# Patient Record
Sex: Female | Born: 1945 | Race: White | Hispanic: No | Marital: Married | Smoking: Never smoker
Health system: Southern US, Community
[De-identification: ages and names within clinical notes are randomized; demographics above are authoritative.]

## PROBLEM LIST (undated history)

## (undated) DIAGNOSIS — K219 Gastro-esophageal reflux disease without esophagitis: Secondary | ICD-10-CM

## (undated) DIAGNOSIS — I10 Essential (primary) hypertension: Secondary | ICD-10-CM

## (undated) DIAGNOSIS — K759 Inflammatory liver disease, unspecified: Secondary | ICD-10-CM

## (undated) DIAGNOSIS — G709 Myoneural disorder, unspecified: Secondary | ICD-10-CM

## (undated) DIAGNOSIS — J383 Other diseases of vocal cords: Secondary | ICD-10-CM

## (undated) DIAGNOSIS — R011 Cardiac murmur, unspecified: Secondary | ICD-10-CM

## (undated) DIAGNOSIS — E785 Hyperlipidemia, unspecified: Secondary | ICD-10-CM

## (undated) HISTORY — PX: TONSILLECTOMY: SUR1361

## (undated) HISTORY — PX: COLONOSCOPY: SHX174

## (undated) HISTORY — DX: Hyperlipidemia, unspecified: E78.5

## (undated) HISTORY — PX: FRACTURE SURGERY: SHX138

## (undated) HISTORY — PX: CATARACT EXTRACTION: SUR2

---

## 2007-07-14 ENCOUNTER — Emergency Department (HOSPITAL_COMMUNITY): Admission: EM | Admit: 2007-07-14 | Discharge: 2007-07-14 | Payer: Self-pay | Admitting: Emergency Medicine

## 2007-07-16 ENCOUNTER — Encounter: Payer: Self-pay | Admitting: Family Medicine

## 2007-07-16 ENCOUNTER — Ambulatory Visit: Payer: Self-pay

## 2007-09-17 ENCOUNTER — Ambulatory Visit (HOSPITAL_COMMUNITY): Admission: RE | Admit: 2007-09-17 | Discharge: 2007-09-17 | Payer: Self-pay | Admitting: Family Medicine

## 2007-10-15 ENCOUNTER — Ambulatory Visit: Payer: Self-pay

## 2007-10-28 ENCOUNTER — Ambulatory Visit: Payer: Self-pay | Admitting: Internal Medicine

## 2007-12-30 ENCOUNTER — Ambulatory Visit: Payer: Self-pay | Admitting: Internal Medicine

## 2008-01-15 ENCOUNTER — Ambulatory Visit: Payer: Self-pay | Admitting: Emergency Medicine

## 2008-01-15 DIAGNOSIS — Z8679 Personal history of other diseases of the circulatory system: Secondary | ICD-10-CM | POA: Insufficient documentation

## 2008-01-15 DIAGNOSIS — I10 Essential (primary) hypertension: Secondary | ICD-10-CM | POA: Insufficient documentation

## 2008-01-15 DIAGNOSIS — J383 Other diseases of vocal cords: Secondary | ICD-10-CM | POA: Insufficient documentation

## 2008-01-15 DIAGNOSIS — R0602 Shortness of breath: Secondary | ICD-10-CM | POA: Insufficient documentation

## 2008-01-26 ENCOUNTER — Encounter: Payer: Self-pay | Admitting: Emergency Medicine

## 2008-02-10 ENCOUNTER — Encounter: Admission: RE | Admit: 2008-02-10 | Discharge: 2008-04-01 | Payer: Self-pay | Admitting: Emergency Medicine

## 2008-02-10 ENCOUNTER — Encounter: Payer: Self-pay | Admitting: Emergency Medicine

## 2008-02-17 ENCOUNTER — Ambulatory Visit: Payer: Self-pay | Admitting: Emergency Medicine

## 2008-02-23 ENCOUNTER — Ambulatory Visit: Payer: Self-pay | Admitting: Emergency Medicine

## 2008-03-16 ENCOUNTER — Ambulatory Visit: Payer: Self-pay | Admitting: Internal Medicine

## 2008-03-24 ENCOUNTER — Encounter: Payer: Self-pay | Admitting: Emergency Medicine

## 2008-04-13 ENCOUNTER — Ambulatory Visit: Payer: Self-pay | Admitting: Emergency Medicine

## 2008-04-14 ENCOUNTER — Encounter: Payer: Self-pay | Admitting: Emergency Medicine

## 2008-07-27 ENCOUNTER — Ambulatory Visit: Payer: Self-pay | Admitting: Internal Medicine

## 2009-11-25 ENCOUNTER — Encounter: Admission: RE | Admit: 2009-11-25 | Discharge: 2009-11-25 | Payer: Self-pay | Admitting: Family Medicine

## 2010-08-15 NOTE — Assessment & Plan Note (Signed)
[facility]ALTHCARE                            CARDIOLOGY OFFICE NOTE   Erin Mckee, Erin Mckee                       MRN:          [id number]  DATE:10/28/2007                            DOB:          [birth year]    CONSULTATION NOTE   REFERRING PHYSICIAN:  Shade Flood, M.D.   REASON FOR VISIT:  Dyspnea on exertion.   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 Neva Seat for further  evaluation of dyspnea.   History is really only significant for hypertension.  She was diagnosed  with borderline hypertension for 20 years but really did not need to be  treated until about the last 3 months.  She has no known history of  coronary disease.  She has never had a cardiac catheterization.   She states that in February, she went on a ski trip with her family to  [place].  When she was there she noticed that she felt very short of  breath and out of shape, so when she came home she vowed to get in  better shape, she started walking her dog every day and lost 15 pounds.  Unfortunately, she still has periods of severe dyspnea.  She says it  sort of comes and goes and when it comes she feels like her throat is  closing up and she just cannot get a breath.  She had fairly extensive  workup by her primary care physician.  She had a exercise Myoview, she  walked 7 minutes and 30 seconds.  EKG was normal, EF was 74% with no  evidence of ischemia.  She also had an echocardiogram, which showed  significant diastolic dysfunction, EF was vigorous around 70%.  Doppler  parameters were consistent with high left ventricular filling pressures.  Her pulmonary pressures were mildly elevated at 34.  She has also been  seen by ENT and found to have a sort of polypoid structure on her right  tonsil and this is going to be resected next Monday.  She also had  pulmonary function tests, which were reportedly  normal.  She has been  tried on inhalers and not had any benefit from this.  Finally, her  mother has pheochromocytoma.  She has had her urine catecholamines  checked, these were negative, although her serum catecholamines were  elevated.  She had a CT which showed no evidence of adrenal tumor.   She has also had a GI workup with negative barium swallow.  There is no  evidence of reflux on her exam with ENT.   REVIEW OF SYSTEMS:  She has not had any orthopnea, no PND, no lower  extremity edema.  She has not had any rashes or arthropathies.  She  denies any history of allergies.  Remainder of review of systems is  negative except for HPI and problem list.   PROBLEM LIST:  1. Hypertension.  2. Dyspnea as described above.   CURRENT MEDICATIONS:  1. Hydrochlorothiazide 12.5 a day.  2. Avapro 150 a day.  3.  Norvasc 10 a day.  4. Zyrtec 10 a day.  5. Prilosec 20 a day.   SOCIAL HISTORY:  She is married with 2 kids, works as a Teacher, early years/pre at  [employer].  She is not a smoker.  She does not drink.   FAMILY HISTORY:  Mother is alive at 3 with a history of  pheochromocytoma.  Father is alive at 98.  Two sisters are 36 and 29,  alive and well and brother who is 73.  There is no family history of  premature coronary artery disease.   PHYSICAL EXAMINATION:  GENERAL:  She is well appearing, ambulates around  the clinic without respiratory difficulty.  VITAL SIGNS:  Blood pressure is 132/80, heart rate is 91, and weight is  130.  HEENT:  Normal except for what appears to be a polyp like structure on  the right tonsil.  It does not appear obstructive.  NECK:  Supple.  There is no JVD.  Carotids are 2+ bilaterally with  bruits.  There is no lymphadenopathy or thyromegaly.  CARDIAC:  Regular rate and rhythm with a 2/6 systolic ejection murmur at  the left sternal border, which gets louder with inspiration.  LUNGS:  Clear.  There is no wheezing.  ABDOMEN:  Soft, nontender, nondistended.   There is no  hepatosplenomegaly.  No bruits.  No masses.  Good bowel sounds.  EXTREMITIES:  Warm with no cyanosis, clubbing, or edema.  No rash.  NEURO:  Alert and oriented x3.  Cranial nerves II through XII are  intact.  Moves all 4 extremities without difficulty.  Affect is  pleasant.   LABORATORY DATA:  EKG shows normal sinus rhythm at a rate of 91 with no  ST-T wave abnormalities.   ASSESSMENT:  Dyspnea; this is a difficult case.  So far, her workup is  really only notable for significant diastolic dysfunction.  There is no  evidence of ischemia or significant pulmonary hypertension.  My thought  is that her symptoms are likely due to diastolic dysfunction, but I  think other possibilities include a possible exercise-induced pulmonary  hypertension or allergies.  Certainly, she could have a possibility of  underlying ischemic heart disease, but her stress test is quite  reassuring.  She is scheduled for a partial tonsillectomy next week and  we are hoping this will help, but I am concerned that she may not see  any significant benefit.  We will wait, see her back in 4 to 6 weeks.  In the meantime, I will check a BNP, and an ANA and a sed a rate.  Should she continue to be short of breath after her tonsil is removed, I  think the next step would  be to proceed with a CPX test and possible allergy testing to see if we  can help further define the problem.  I told her to get in touch with me  should her symptoms be getting worse.     Bevelyn Buckles. Bensimhon, MD  Electronically Signed    DRB/MedQ  DD: 10/28/2007  DT: 10/29/2007  Job #: [id number]

## 2010-08-15 NOTE — Assessment & Plan Note (Signed)
[facility]                            CARDIOLOGY OFFICE NOTE   Erin, Mckee                       MRN:          [id number]  DATE:12/30/2007                            DOB:          1946/02/17    PRIMARY CARE PHYSICIAN:  Asencion Partridge. 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   PAST MEDICAL HISTORY:  Notable for hypertension.  However, the past  year, she has had difficulty with significant dyspnea.  She really  states that this feels like it is physically hard to inhale.  She feels  like she is congested in her upper airways.  She went to Dr. Suszanne Conners at ENT  and was found to have a polyp on her tonsils.  She underwent  tonsillectomy several weeks ago, which was a fairly difficult procedure  for her, but she feels much better, and fortunately has not had any  effect on her dyspnea, whatsoever.  On followup with Dr. Suszanne Conners, she was  told that she had paradoxical vocal cord motion and feels that this  maybe related to anxiety.  She was started on Ativan and also sent for a  biofeedback.   From a cardiac standpoint, she has had a Myoview, which was normal and  also an echocardiogram, which showed vigorous LV function with evidence  of significant diastolic dysfunction with elevated filling pressure.  She was since then started on a diuretic, but this has not helped  either.  She has started exercising and walks her dogs 2 miles a day and  also goes to the [employer].  She does a stationary bike and  Manufacturing engineer.  She states she feels like she is breathing okay  when she is on there, but really more short of breath at rest.  She has  not had any orthopnea or PND.  No lower extremity edema.  No wheezing.  She has tried inhalers in the past without any benefit.  She also had a  GI workup with a negative barium swallow,  and there was no evidence of  reflux on her exam with ENT.   CURRENT MEDICATIONS:  1. Avapro 150 a day.  2. Norvasc 10 a day.  3. Hydrochlorothiazide 25 a day.  4. Ativan 0.5 t.i.d.   PHYSICAL EXAMINATION:  GENERAL:  She is well appearing, in no acute  distress, ambulatory around the clinic without any respiratory  difficulty.  VITAL SIGNS:  Blood pressure is 130/84, heart rate is 88, and weight is  127.  HEENT:  Normal.  Oropharynx is clear.  NECK:  Supple.  There is no JVD.  Carotids are 2+ bilaterally without  bruits.  There is no lymphadenopathy or thyromegaly.  CARDIAC:  PMI is nondisplaced.  Regularly regular rate and rhythm with a  prominent S4.  No murmur.  LUNGS:  Clear.  No wheezes or stridor.  ABDOMEN:  Soft, nontender, and nondistended.  There is no  hepatosplenomegaly.  No bruits.  No masses.  EXTREMITIES:  Warm with no cyanosis, clubbing, or edema.  No rash.  NEURO:  Alert and oriented x3.  Cranial nerves II-XII are intact.  Moves  all 4 extremities without difficulty.  Affect is pleasant.   ASSESSMENT AND PLAN:  Dyspnea.  This really seems like more of a  mechanical problem for her.  We had a long discussion about the nature  of diastolic dysfunction, and although she does appear to have  significant diastolic dysfunction on her echocardiogram, I do not think  this explains her sense of difficulty inhaling.  We will set up an  appointment with Dr. Delton Coombes to see if he has any insights.  If this is  unrevealing, we did discuss the possibility of right and left heart  catheterization to more clearly sort out her filling pressures and  coronary anatomy.  She is not very excited about this prospect at this  time.  We will see how she does over the next few months and see her  back and continue these discussions.   Given her hyperdynamic left ventricular function, I did start her on a  low-dose Toprol 50 mg a day and see if this has any effect on her  symptoms.      Bevelyn Buckles. Bensimhon, MD  Electronically Signed    DRB/MedQ  DD: 12/30/2007  DT: 12/31/2007  Job #: [id number]   cc:   Shade Flood, M.D.

## 2010-08-15 NOTE — Assessment & Plan Note (Signed)
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  She also has a history of  hypertension.   She has had a fairly extensive workup.  Already she has seen ENT.  She  has had normal Myoview and an echocardiogram which showed vigorous LV  dysfunction with diastolic dysfunction.  She has also seen Dr. Delton Coombes,  who sent her for speech training to help with her vocal cord problem.  Looking back over the notes apparently she saw Dr. Suszanne Conners from ENT and was  found to have paradoxical vocal cord motion, feels like this maybe  related to anxiety.  She was treated with Ativan and biofeedback.   She returns today for followup.  She is now exercising at the gym.  She  said initially she could not do a minute and a half on exercise machine  and now she can do 30 minutes at a time.  She starts out to get a little  short of breath and she feels fine; sometimes on the machine, it shows  that her heart rate goes up slowly, then goes from 110 to 220.  However,  she has a heart rate watch and she has not noticed this jump.  She  denies any chest pain.  She does have some chronic dyspnea, which can  come on anytime.  She is actually taking more Ativan which seems to help  her and during that time, she feels she cannot breathe.   She has not had any orthopnea or PND.  No lower extremity edema.  Blood  pressure dropped significantly with Toprol, so she ended up stopping her  Avapro.   CURRENT MEDICATIONS:  1. Norvasc 10 a day.  2. Hydrochlorothiazide 25 a day.  3. Ativan 0.5 mg t.i.d.  4. Toprol-XL 50 a day.   PHYSICAL EXAMINATION:  GENERAL:  She is in  no acute distress.  She  ambulates in the clinic without any respiratory difficulty.  VITAL SIGNS:  Blood pressure is 122/70 that 104/78, heart rate 72, and  weight is 134.  HEENT:  Normal.  NECK:  Supple.  There is no JVD.  Carotids are 2+ bilaterally without  any bruits.  There is no lymphadenopathy or thyromegaly.  CARDIAC:  PMI is nondisplaced.  Regular rate and rhythm.  No murmurs,  rubs, or gallops.  LUNGS:  Clear.  ABDOMEN:  Soft, nontender, nondistended.  No hepatosplenomegaly, no  bruits, no masses.  Good bowel sounds.  EXTREMITIES:  Warm with no cyanosis, clubbing, or edema.  NEUROLOGIC:  Alert and oriented x3.  Cranial nerves II through XII  intact.  Moves all 4 extremities without difficulty.  Affect is  pleasant.  She does seem to have a little bit  abnormal speech pattern  where she gasps for air.   EKG shows normal sinus rhythm at a rate of 72.   ASSESSMENT AND PLAN:  Dyspnea.  I suspect this is multifactorial.  She  does have evidence of diastolic dysfunction on her echocardiogram,  however, her symptoms are not really that consistent with this and I do  suspect her vocal cord dysfunction maybe playing a role as well as  possible anxiety disorder.  At this point, we will refer her for  cardiopulmonary exercise test to further evaluate.  We will also send  her to a clinic she knows about at [facility] which deals with vocal  cord abnormalities and see what therapies they may have to offer.     Bevelyn Buckles. Bensimhon, MD  Electronically Signed    DRB/MedQ  DD: 03/16/2008  DT: 03/17/2008  Job #: [id number]

## 2010-12-26 LAB — POCT I-STAT, CHEM 8
BUN: 8
Calcium, Ion: 1.17
Chloride: 97
Creatinine, Ser: 1.1
Glucose, Bld: 118 — ABNORMAL HIGH
HCT: 41
Hemoglobin: 13.9
Potassium: 3.8
Sodium: 136
TCO2: 31

## 2011-09-13 ENCOUNTER — Ambulatory Visit (INDEPENDENT_AMBULATORY_CARE_PROVIDER_SITE_OTHER): Payer: Medicare Other | Admitting: Family Medicine

## 2011-09-13 VITALS — BP 154/84 | HR 72 | Temp 98.6°F | Resp 16 | Ht 65.0 in | Wt 156.8 lb

## 2011-09-13 DIAGNOSIS — J383 Other diseases of vocal cords: Secondary | ICD-10-CM | POA: Diagnosis not present

## 2011-09-13 DIAGNOSIS — E785 Hyperlipidemia, unspecified: Secondary | ICD-10-CM | POA: Insufficient documentation

## 2011-09-13 DIAGNOSIS — K767 Hepatorenal syndrome: Secondary | ICD-10-CM | POA: Diagnosis not present

## 2011-09-13 DIAGNOSIS — I10 Essential (primary) hypertension: Secondary | ICD-10-CM | POA: Diagnosis not present

## 2011-09-13 LAB — BASIC METABOLIC PANEL
BUN: 14 mg/dL (ref 6–23)
CO2: 30 mEq/L (ref 19–32)
Calcium: 10 mg/dL (ref 8.4–10.5)
Chloride: 96 mEq/L (ref 96–112)
Creat: 0.78 mg/dL (ref 0.50–1.10)
Glucose, Bld: 97 mg/dL (ref 70–99)
Sodium: 134 mEq/L — ABNORMAL LOW (ref 135–145)

## 2011-09-13 MED ORDER — AMITRIPTYLINE HCL 25 MG PO TABS: 25.0000 mg | ORAL_TABLET | Freq: Every day | ORAL | Status: DC

## 2011-09-13 MED ORDER — LORAZEPAM 0.5 MG PO TABS: ORAL_TABLET | ORAL | Status: DC

## 2011-09-13 MED ORDER — HYDROCHLOROTHIAZIDE 25 MG PO TABS: 25.0000 mg | ORAL_TABLET | Freq: Every day | ORAL | Status: DC

## 2011-09-13 MED ORDER — METOPROLOL SUCCINATE ER 50 MG PO TB24: 50.0000 mg | ORAL_TABLET | Freq: Every day | ORAL | Status: DC

## 2011-09-13 NOTE — Patient Instructions (Addendum)
Your should receive a call or letter about your lab results within the next week to 10 days.  Return to the clinic or go to the nearest emergency room if any of your symptoms worsen or new symptoms occur. Schedule physical in the next 3 to 6 months.

## 2011-09-13 NOTE — Progress Notes (Signed)
  Subjective:    Patient ID: Erin Mckee, female    DOB: 09-17-1945, 66 y.o.   MRN: [id number] HPI Erin Mckee is a 66 y.o. female  HTn - outside BP's - 130/80's.  Rare higher number.    Hyperlipidemia.- on fish oil. Tchol 217, LDL 122. Weight 148-156 since July 2012.  Advil for sore muscles - 600mg   3-4 times per day.     Review of Systems  Constitutional: Negative for fatigue.  Respiratory: Negative for chest tightness and shortness of breath.   Cardiovascular: Negative for chest pain, palpitations and leg swelling.  Gastrointestinal: Negative for abdominal pain and blood in stool.  Neurological: Negative for dizziness, syncope, light-headedness and headaches.       Objective:   Physical Exam  Constitutional: She is oriented to person, place, and time. She appears well-developed and well-nourished.  HENT:  Head: Normocephalic and atraumatic.  Eyes: Conjunctivae and EOM are normal. Pupils are equal, round, and reactive to light.  Neck: Carotid bruit is not present.  Cardiovascular: Normal rate, regular rhythm, normal heart sounds and intact distal pulses.   Pulmonary/Chest: Effort normal and breath sounds normal.  Abdominal: Soft. She exhibits no pulsatile midline mass. There is no tenderness.  Neurological: She is alert and oriented to person, place, and time.  Skin: Skin is warm and dry.  Psychiatric: She has a normal mood and affect. Her behavior is normal.          Assessment & Plan:  Erin Mckee is a 66 y.o. female 1. Hypertension  Basic metabolic panel  2. Hyperlipidemia  Basic metabolic panel  3. Vocal cord dysfunction      VCD - stable with Elavil, Ativan as prior.   Borderline control on HTN  Improved home numbers. No change in doses today.   Not fasting today. Slight weight gain, prior hyperlipidemia - on fish oil.  Plan on CPE next 3-6 months for fasting labs.  Work on diet and exercise until then.  Consider tsh if still with trouble losing  weight.

## 2011-09-19 DIAGNOSIS — L820 Inflamed seborrheic keratosis: Secondary | ICD-10-CM | POA: Diagnosis not present

## 2011-09-19 DIAGNOSIS — L259 Unspecified contact dermatitis, unspecified cause: Secondary | ICD-10-CM | POA: Diagnosis not present

## 2012-02-11 ENCOUNTER — Encounter (HOSPITAL_COMMUNITY): Payer: Self-pay | Admitting: Emergency Medicine

## 2012-02-11 ENCOUNTER — Emergency Department (INDEPENDENT_AMBULATORY_CARE_PROVIDER_SITE_OTHER): Payer: Medicare Other

## 2012-02-11 ENCOUNTER — Emergency Department (INDEPENDENT_AMBULATORY_CARE_PROVIDER_SITE_OTHER)
Admission: EM | Admit: 2012-02-11 | Discharge: 2012-02-11 | Disposition: A | Discharge status: Home / Self Care | Payer: Medicare Other | Source: Home / Self Care

## 2012-02-11 DIAGNOSIS — S52509A Unspecified fracture of the lower end of unspecified radius, initial encounter for closed fracture: Secondary | ICD-10-CM

## 2012-02-11 DIAGNOSIS — S52609A Unspecified fracture of lower end of unspecified ulna, initial encounter for closed fracture: Secondary | ICD-10-CM | POA: Diagnosis not present

## 2012-02-11 NOTE — Progress Notes (Signed)
Orthopedic Tech Progress Note Patient Details:  Erin Mckee [birth year] [id number]  Ortho Devices Type of Ortho Device: Arm foam sling;Sugartong splint Ortho Device/Splint Location: (R) UE Ortho Device/Splint Interventions: Application   [name] 02/11/2012, 8:28 PM

## 2012-02-11 NOTE — ED Provider Notes (Signed)
Medical screening examination/treatment/procedure(s) were performed by resident physician or non-physician practitioner and as supervising physician I was immediately available for consultation/collaboration.   Barkley Bruns MD.    Linna Hoff, MD 02/11/12 2038

## 2012-02-11 NOTE — ED Provider Notes (Signed)
History     CSN: [id number]  Arrival date & time 02/11/12  1617   None     Chief Complaint  Patient presents with  . Arm Injury    (Consider location/radiation/quality/duration/timing/severity/associated sxs/prior treatment) HPI Comments: Pleasant 66 year old female was attempting to mount a horse this afternoon however the saddle slipped and she fell with an outstretched right arm and injured her right wrist. Since her only injury she denies injury to the head neck chest back abdomen pelvis or other extremities. She is fully alert awake oriented with the only complaint of right wrist pain.  Patient is a 66 y.o. female presenting with arm injury.  Arm Injury     History reviewed. No pertinent past medical history.  History reviewed. No pertinent past surgical history.  History reviewed. No pertinent family history.  History  Substance Use Topics  . Smoking status: Never Smoker   . Smokeless tobacco: Not on file  . Alcohol Use: Not on file    OB History    Grav Para Term Preterm Abortions TAB SAB Ect Mult Living                  Review of Systems  All other systems reviewed and are negative.    Allergies  Review of patient's allergies indicates no known allergies.  Home Medications   Current Outpatient Rx  Name  Route  Sig  Dispense  Refill  . AMITRIPTYLINE HCL 25 MG PO TABS   Oral   Take 1 tablet (25 mg total) by mouth at bedtime.   90 tablet   1   . OMEGA-3 FATTY ACIDS 1000 MG PO CAPS   Oral   Take 2 g by mouth daily.         Marland Kitchen HYDROCHLOROTHIAZIDE 25 MG PO TABS   Oral   Take 1 tablet (25 mg total) by mouth daily.   90 tablet   1   . IBUPROFEN 100 MG PO TABS   Oral   Take 100 mg by mouth every 6 (six) hours as needed.         Marland Kitchen LORAZEPAM 0.5 MG PO TABS      Once per day as needed.   90 tablet   1   . METOPROLOL SUCCINATE ER 50 MG PO TB24   Oral   Take 1 tablet (50 mg total) by mouth daily. Take with or immediately following a  meal.   90 tablet   1     BP 140/70  Pulse 81  Temp 98.4 F (36.9 C) (Oral)  Resp 16  SpO2 100%  Physical Exam  Constitutional: She is oriented to person, place, and time. She appears well-developed and well-nourished. No distress.  HENT:  Head: Normocephalic and atraumatic.  Eyes: EOM are normal. Pupils are equal, round, and reactive to light.  Neck: Normal range of motion. Neck supple.  Musculoskeletal: She exhibits edema and tenderness.       There is swelling and deformity of the right wrist. Distal neurovascular motor sensory is intact she is able to wiggle her fingers and flex and extend the digits. Radial pulse is 2+.  Lymphadenopathy:    She has no cervical adenopathy.  Neurological: She is alert and oriented to person, place, and time. No cranial nerve deficit.  Skin: Skin is warm and dry.  Psychiatric: She has a normal mood and affect.    ED Course  Procedures (including critical care time)  Labs Reviewed - No data to  display Dg Wrist Complete Right  02/11/2012  *RADIOLOGY REPORT*  Clinical Data: Trauma and pain.  RIGHT WRIST - COMPLETE 3+ VIEW  Comparison: None.  Findings: Mildly displaced ulnar styloid fracture.  Comminuted, impacted, dorsally angulated, intra-articular distal radius fracture.  Overlying soft tissue swelling.  IMPRESSION: Distal radius and ulnar fractures, as detailed.   Original Report Authenticated By: Jeronimo Greaves, M.D.      1. Fracture of radius and ulna, distal       MDM  Dr. Otelia Sergeant was called and consulted regarding the fracture the radius and ulna at 740 hours. After consultation we will splint the wrist with a sugar to explain it and placed it in a sling. She keep it elevated and apply ice. The patient said she had Vicodin 5 mg at home and that I did not need to write a prescription that she can take that for pain as needed. She also has been instructed on circulation checks. If he develops discoloration, numbness, decreased sensation  or ability to move her fingers or increased pain she is to go to the emergency department promptly. The patient will make a telephone call to the office of Dr. Allie Bossier tomorrow at [phone]. She will call at 8:15 to be seen first thing in the morning.        Hayden Rasmussen, NP 02/11/12 [phone]

## 2012-02-11 NOTE — ED Notes (Signed)
Reports getting on horse and the saddle slipped today around 2.  Patient fell on right arm.  Patient reports swelling and able to move fingers.

## 2012-02-13 ENCOUNTER — Encounter (HOSPITAL_COMMUNITY)
Admission: RE | Admit: 2012-02-13 | Discharge: 2012-02-13 | Disposition: A | Discharge status: Home / Self Care | Payer: Medicare Other | Source: Ambulatory Visit | Attending: Orthopaedic Surgery | Admitting: Orthopaedic Surgery

## 2012-02-13 ENCOUNTER — Ambulatory Visit (HOSPITAL_COMMUNITY)
Admission: RE | Admit: 2012-02-13 | Discharge: 2012-02-13 | Disposition: A | Discharge status: Home / Self Care | Payer: Medicare Other | Source: Ambulatory Visit | Attending: Orthopaedic Surgery | Admitting: Orthopaedic Surgery

## 2012-02-13 ENCOUNTER — Encounter (HOSPITAL_COMMUNITY): Payer: Self-pay

## 2012-02-13 ENCOUNTER — Other Ambulatory Visit (HOSPITAL_COMMUNITY): Payer: Self-pay | Admitting: Orthopaedic Surgery

## 2012-02-13 DIAGNOSIS — Z01818 Encounter for other preprocedural examination: Secondary | ICD-10-CM | POA: Diagnosis not present

## 2012-02-13 DIAGNOSIS — J8410 Pulmonary fibrosis, unspecified: Secondary | ICD-10-CM | POA: Diagnosis not present

## 2012-02-13 DIAGNOSIS — S52539A Colles' fracture of unspecified radius, initial encounter for closed fracture: Secondary | ICD-10-CM | POA: Diagnosis not present

## 2012-02-13 DIAGNOSIS — I10 Essential (primary) hypertension: Secondary | ICD-10-CM | POA: Insufficient documentation

## 2012-02-13 DIAGNOSIS — S52599A Other fractures of lower end of unspecified radius, initial encounter for closed fracture: Secondary | ICD-10-CM | POA: Diagnosis not present

## 2012-02-13 DIAGNOSIS — Z79899 Other long term (current) drug therapy: Secondary | ICD-10-CM | POA: Diagnosis not present

## 2012-02-13 DIAGNOSIS — Z01812 Encounter for preprocedural laboratory examination: Secondary | ICD-10-CM | POA: Diagnosis not present

## 2012-02-13 HISTORY — DX: Cardiac murmur, unspecified: R01.1

## 2012-02-13 HISTORY — DX: Essential (primary) hypertension: I10

## 2012-02-13 HISTORY — DX: Myoneural disorder, unspecified: G70.9

## 2012-02-13 HISTORY — DX: Gastro-esophageal reflux disease without esophagitis: K21.9

## 2012-02-13 LAB — CBC
HCT: 40.4 % (ref 36.0–46.0)
Hemoglobin: 13.7 g/dL (ref 12.0–15.0)
RDW: 12.7 % (ref 11.5–15.5)
WBC: 7.9 10*3/uL (ref 4.0–10.5)

## 2012-02-13 LAB — BASIC METABOLIC PANEL
Chloride: 98 mEq/L (ref 96–112)
GFR calc Af Amer: >90 mL/min (ref >90)
Potassium: 3.9 mEq/L (ref 3.5–5.1)

## 2012-02-13 LAB — SURGICAL PCR SCREEN: Staphylococcus aureus: NEGATIVE

## 2012-02-13 MED ORDER — CEFAZOLIN SODIUM-DEXTROSE 2-3 GM-% IV SOLR
2.0000 g | INTRAVENOUS | Status: AC
  Administered 2012-02-14: 2 g via INTRAVENOUS
  Filled 2012-02-13: qty 50

## 2012-02-13 NOTE — Anesthesia Preprocedure Evaluation (Addendum)
Anesthesia Evaluation  Patient identified by MRN, date of birth, ID band Patient awake    Reviewed: Allergy & Precautions, H&P , NPO status , Patient's Chart, lab work & pertinent test results  Airway Mallampati: II TM Distance: >3 FB Neck ROM: Full    Dental   Pulmonary shortness of breath, with exertion and at rest,  Dysphonia of unknown etiology dysphagia breath sounds clear to auscultation        Cardiovascular hypertension, Rhythm:Regular Rate:Normal     Neuro/Psych  Neuromuscular disease    GI/Hepatic GERD-  ,(+) Hepatitis -  Endo/Other    Renal/GU      Musculoskeletal   Abdominal   Peds  Hematology   Anesthesia Other Findings abnl pharyngeal anatomy with kissing tonsillar pillars even after tonsilectomy in relaxed state which causes pt respiratory difficulty hoarse  Reproductive/Obstetrics                           Anesthesia Physical Anesthesia Plan  ASA: III  Anesthesia Plan: General   Post-op Pain Management:    Induction: Intravenous  Airway Management Planned: Mask and LMA  Additional Equipment:   Intra-op Plan:   Post-operative Plan: Extubation in OR  Informed Consent: I have reviewed the patients History and Physical, chart, labs and discussed the procedure including the risks, benefits and alternatives for the proposed anesthesia with the patient or authorized representative who has indicated his/her understanding and acceptance.     Plan Discussed with: CRNA and Surgeon  Anesthesia Plan Comments: (See anesthesia note.  Dr. Gypsy Balsam to be her anesthesiologist.  Shonna Chock, PA-C If good Supraclav block will try to avoid pharyngeal instrumentation and just use mask (vs LMA) as pt desires GA due to anxiety of OR/surgery)       Anesthesia Quick Evaluation

## 2012-02-13 NOTE — Progress Notes (Signed)
Report to A. Zelenak,PAC, regarding pt. Report of spasmodic dysphonia. Pt. Reports that she was seen some time ago at [facility]. for throat, breathing issues.  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

## 2012-02-13 NOTE — Progress Notes (Signed)
Call to Dr. Eliberto Ivory office, asked that [facility] convey to Dr. Magnus Ivan to sign orders.

## 2012-02-13 NOTE — Pre-Procedure Instructions (Addendum)
20 Erin Mckee  02/13/2012   Your procedure is scheduled on:  02/14/2012  Report to [facility] at 1:30PM.  Call this number if you have problems the morning of surgery: [phone]   Remember:   Do not eat food or drink liquiid:After Midnight.- TONIGHT- pt. Reports MD informed her she can have light breakfast before 8a.m.   :    Take these medicines the morning of surgery with A SIP OF WATER: Prilosec, ToprolXL, Lorazepam, Elavil   Do not wear jewelry, make-up or nail polish.  Do not wear lotions, powders, or perfumes. You may wear deodorant.  Do not shave 48 hours prior to surgery. Men may shave face and neck.  Do not bring valuables to the hospital.  Contacts, dentures or bridgework may not be worn into surgery.  Leave suitcase in the car. After surgery it may be brought to your room.  For patients admitted to the hospital, checkout time is 11:00 AM the day of discharge.   Patients discharged the day of surgery will not be allowed to drive home.  Name and phone number of your driver: /w spouse  Special Instructions:  Shower tonight, & tomorrow a.m.   Please read over the following fact sheets that you were given: Pain Booklet, Coughing and Deep Breathing, MRSA Information and Surgical Site Infection Prevention

## 2012-02-13 NOTE — Consult Note (Signed)
Anesthesia note: Patient is a 66 year old female scheduled for ORIF of the distal right radial fracture by Dr. Magnus Ivan on 02/14/2012. She is a Teacher, early years/pre.  History reviewed. 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  She has had recurrent issues of feeling like her throat is closing and is currently undergoing evaluation by a speech therapist and is planning re-evaluation by either an ENT or Neurologist.    She was evaluated by Anesthesiologist Dr. Gypsy Balsam.  He plans to be her anesthesiologist tomorrow (OR scheduling notified).  Due to her oropharyngeal anatomy and symptoms he anticipates GA with an LMA.  (Intubation with use of a fiberoptic scope may be required if ETT is needed.)  Anesthesia records from her tonsillectomy at the [facility] were requested earlier this afternoon.  Pre-operative results noted from today.  Shonna Chock, PA-C 02/13/12 1718

## 2012-02-14 ENCOUNTER — Encounter (HOSPITAL_COMMUNITY): Payer: Self-pay | Admitting: Vascular Surgery

## 2012-02-14 ENCOUNTER — Encounter (HOSPITAL_COMMUNITY): Payer: Self-pay | Admitting: *Deleted

## 2012-02-14 ENCOUNTER — Encounter (HOSPITAL_COMMUNITY)
Admission: RE | Disposition: A | Discharge status: Home / Self Care | Payer: Self-pay | Source: Ambulatory Visit | Attending: Orthopaedic Surgery

## 2012-02-14 ENCOUNTER — Ambulatory Visit (HOSPITAL_COMMUNITY)
Admission: RE | Admit: 2012-02-14 | Discharge: 2012-02-15 | Disposition: A | Discharge status: Home / Self Care | Payer: Medicare Other | Source: Ambulatory Visit | Attending: Orthopaedic Surgery | Admitting: Orthopaedic Surgery

## 2012-02-14 ENCOUNTER — Ambulatory Visit (HOSPITAL_COMMUNITY): Payer: Medicare Other | Admitting: Vascular Surgery

## 2012-02-14 DIAGNOSIS — Z01812 Encounter for preprocedural laboratory examination: Secondary | ICD-10-CM | POA: Insufficient documentation

## 2012-02-14 DIAGNOSIS — I10 Essential (primary) hypertension: Secondary | ICD-10-CM | POA: Insufficient documentation

## 2012-02-14 DIAGNOSIS — G8918 Other acute postprocedural pain: Secondary | ICD-10-CM | POA: Diagnosis not present

## 2012-02-14 DIAGNOSIS — Z01818 Encounter for other preprocedural examination: Secondary | ICD-10-CM | POA: Insufficient documentation

## 2012-02-14 DIAGNOSIS — Y9352 Activity, horseback riding: Secondary | ICD-10-CM | POA: Insufficient documentation

## 2012-02-14 DIAGNOSIS — Z79899 Other long term (current) drug therapy: Secondary | ICD-10-CM | POA: Insufficient documentation

## 2012-02-14 DIAGNOSIS — S52599A Other fractures of lower end of unspecified radius, initial encounter for closed fracture: Secondary | ICD-10-CM | POA: Insufficient documentation

## 2012-02-14 DIAGNOSIS — S52501A Unspecified fracture of the lower end of right radius, initial encounter for closed fracture: Secondary | ICD-10-CM

## 2012-02-14 DIAGNOSIS — S52539A Colles' fracture of unspecified radius, initial encounter for closed fracture: Secondary | ICD-10-CM | POA: Diagnosis not present

## 2012-02-14 HISTORY — DX: Other diseases of vocal cords: J38.3

## 2012-02-14 HISTORY — DX: Inflammatory liver disease, unspecified: K75.9

## 2012-02-14 HISTORY — PX: OPEN REDUCTION INTERNAL FIXATION (ORIF) DISTAL RADIAL FRACTURE: SHX5989

## 2012-02-14 SURGERY — OPEN REDUCTION INTERNAL FIXATION (ORIF) DISTAL RADIUS FRACTURE
Anesthesia: General | Site: Arm Lower | Laterality: Right | Wound class: Clean

## 2012-02-14 MED ORDER — FENTANYL CITRATE 0.05 MG/ML IJ SOLN
50.0000 ug | Freq: Once | INTRAMUSCULAR | Status: AC
  Administered 2012-02-14: 100 ug via INTRAVENOUS

## 2012-02-14 MED ORDER — PROPOFOL 10 MG/ML IV BOLUS
INTRAVENOUS | Status: DC | PRN
  Administered 2012-02-14: 200 mg via INTRAVENOUS

## 2012-02-14 MED ORDER — AMITRIPTYLINE HCL 25 MG PO TABS
25.0000 mg | ORAL_TABLET | Freq: Every day | ORAL | Status: DC
  Administered 2012-02-15: 25 mg via ORAL
  Filled 2012-02-14 (×2): qty 1

## 2012-02-14 MED ORDER — ONDANSETRON HCL 4 MG PO TABS: 4.0000 mg | ORAL_TABLET | Freq: Four times a day (QID) | ORAL | Status: DC | PRN

## 2012-02-14 MED ORDER — MIDAZOLAM HCL 2 MG/2ML IJ SOLN
INTRAMUSCULAR | Status: AC
  Filled 2012-02-14: qty 4

## 2012-02-14 MED ORDER — MIDAZOLAM HCL 2 MG/2ML IJ SOLN: 0.5000 mg | Freq: Once | INTRAMUSCULAR | Status: DC | PRN

## 2012-02-14 MED ORDER — FENTANYL CITRATE 0.05 MG/ML IJ SOLN: 25.0000 ug | INTRAMUSCULAR | Status: DC | PRN

## 2012-02-14 MED ORDER — METOCLOPRAMIDE HCL 10 MG PO TABS: 5.0000 mg | ORAL_TABLET | Freq: Three times a day (TID) | ORAL | Status: DC | PRN

## 2012-02-14 MED ORDER — 0.9 % SODIUM CHLORIDE (POUR BTL) OPTIME
TOPICAL | Status: DC | PRN
  Administered 2012-02-14: 1000 mL

## 2012-02-14 MED ORDER — FENTANYL CITRATE 0.05 MG/ML IJ SOLN
INTRAMUSCULAR | Status: AC
  Filled 2012-02-14: qty 2

## 2012-02-14 MED ORDER — LIDOCAINE HCL (CARDIAC) 20 MG/ML IV SOLN
INTRAVENOUS | Status: DC | PRN
  Administered 2012-02-14: 75 mg via INTRAVENOUS

## 2012-02-14 MED ORDER — PROMETHAZINE HCL 25 MG/ML IJ SOLN: 6.2500 mg | INTRAMUSCULAR | Status: DC | PRN

## 2012-02-14 MED ORDER — LORAZEPAM 1 MG PO TABS
1.0000 mg | ORAL_TABLET | Freq: Two times a day (BID) | ORAL | Status: DC | PRN
  Administered 2012-02-15: 1 mg via ORAL
  Filled 2012-02-14: qty 1

## 2012-02-14 MED ORDER — PANTOPRAZOLE SODIUM 40 MG PO TBEC: 40.0000 mg | DELAYED_RELEASE_TABLET | Freq: Every day | ORAL | Status: DC

## 2012-02-14 MED ORDER — MIDAZOLAM HCL 2 MG/2ML IJ SOLN
1.0000 mg | INTRAMUSCULAR | Status: DC | PRN
  Administered 2012-02-14 (×2): 2 mg via INTRAVENOUS

## 2012-02-14 MED ORDER — SODIUM CHLORIDE 0.9 % IV SOLN
INTRAVENOUS | Status: DC
  Administered 2012-02-14: 23:00:00 via INTRAVENOUS

## 2012-02-14 MED ORDER — LACTATED RINGERS IV SOLN
INTRAVENOUS | Status: DC
  Administered 2012-02-14: 15:00:00 via INTRAVENOUS

## 2012-02-14 MED ORDER — ZOLPIDEM TARTRATE 5 MG PO TABS: 5.0000 mg | ORAL_TABLET | Freq: Every evening | ORAL | Status: DC | PRN

## 2012-02-14 MED ORDER — LACTATED RINGERS IV SOLN
INTRAVENOUS | Status: DC | PRN
  Administered 2012-02-14: 18:00:00 via INTRAVENOUS

## 2012-02-14 MED ORDER — METOPROLOL SUCCINATE ER 50 MG PO TB24
50.0000 mg | ORAL_TABLET | Freq: Every day | ORAL | Status: DC
  Administered 2012-02-15: 50 mg via ORAL
  Filled 2012-02-14 (×2): qty 1

## 2012-02-14 MED ORDER — HYDROCODONE-ACETAMINOPHEN 5-325 MG PO TABS: 1.0000 | ORAL_TABLET | ORAL | Status: DC | PRN

## 2012-02-14 MED ORDER — CEFAZOLIN SODIUM 1-5 GM-% IV SOLN
1.0000 g | Freq: Four times a day (QID) | INTRAVENOUS | Status: DC
  Administered 2012-02-14 – 2012-02-15 (×2): 1 g via INTRAVENOUS
  Filled 2012-02-14 (×3): qty 50

## 2012-02-14 MED ORDER — DIPHENHYDRAMINE HCL 12.5 MG/5ML PO ELIX: 12.5000 mg | ORAL_SOLUTION | ORAL | Status: DC | PRN

## 2012-02-14 MED ORDER — PROPOFOL INFUSION 10 MG/ML OPTIME
INTRAVENOUS | Status: DC | PRN
  Administered 2012-02-14: 100 ug/kg/min via INTRAVENOUS

## 2012-02-14 MED ORDER — METOCLOPRAMIDE HCL 5 MG/ML IJ SOLN: 5.0000 mg | Freq: Three times a day (TID) | INTRAMUSCULAR | Status: DC | PRN

## 2012-02-14 MED ORDER — METHOCARBAMOL 500 MG PO TABS: 500.0000 mg | ORAL_TABLET | Freq: Four times a day (QID) | ORAL | Status: DC | PRN

## 2012-02-14 MED ORDER — METHOCARBAMOL 100 MG/ML IJ SOLN: 500.0000 mg | Freq: Four times a day (QID) | INTRAVENOUS | Status: DC | PRN

## 2012-02-14 MED ORDER — ONDANSETRON HCL 4 MG/2ML IJ SOLN: 4.0000 mg | Freq: Four times a day (QID) | INTRAMUSCULAR | Status: DC | PRN

## 2012-02-14 MED ORDER — OXYCODONE HCL 5 MG PO TABS: 5.0000 mg | ORAL_TABLET | Freq: Once | ORAL | Status: DC | PRN

## 2012-02-14 MED ORDER — MIDAZOLAM HCL 2 MG/2ML IJ SOLN: 1.0000 mg | INTRAMUSCULAR | Status: DC | PRN

## 2012-02-14 MED ORDER — MORPHINE SULFATE 2 MG/ML IJ SOLN: 1.0000 mg | INTRAMUSCULAR | Status: DC | PRN

## 2012-02-14 MED ORDER — OXYCODONE HCL 5 MG/5ML PO SOLN: 5.0000 mg | Freq: Once | ORAL | Status: DC | PRN

## 2012-02-14 MED ORDER — OXYCODONE HCL 5 MG PO TABS: 5.0000 mg | ORAL_TABLET | ORAL | Status: DC | PRN

## 2012-02-14 MED ORDER — HYDROCHLOROTHIAZIDE 25 MG PO TABS
25.0000 mg | ORAL_TABLET | Freq: Every day | ORAL | Status: DC
  Administered 2012-02-15: 25 mg via ORAL
  Filled 2012-02-14 (×2): qty 1

## 2012-02-14 SURGICAL SUPPLY — 59 items
BANDAGE ELASTIC 3 VELCRO ST LF (GAUZE/BANDAGES/DRESSINGS) ×2 IMPLANT
BANDAGE ELASTIC 4 VELCRO ST LF (GAUZE/BANDAGES/DRESSINGS) ×2 IMPLANT
BANDAGE GAUZE ELAST BULKY 4 IN (GAUZE/BANDAGES/DRESSINGS) ×2 IMPLANT
BIT DRILL 2 FAST STEP (BIT) ×2 IMPLANT
BIT DRILL 2.5X4 QC (BIT) ×2 IMPLANT
BNDG ESMARK 4X9 LF (GAUZE/BANDAGES/DRESSINGS) ×2 IMPLANT
CLOTH BEACON ORANGE TIMEOUT ST (SAFETY) ×2 IMPLANT
CORDS BIPOLAR (ELECTRODE) ×2 IMPLANT
COVER SURGICAL LIGHT HANDLE (MISCELLANEOUS) ×2 IMPLANT
CUFF TOURNIQUET SINGLE 18IN (TOURNIQUET CUFF) ×2 IMPLANT
CUFF TOURNIQUET SINGLE 24IN (TOURNIQUET CUFF) IMPLANT
DRAPE OEC MINIVIEW 54X84 (DRAPES) ×2 IMPLANT
DURAPREP 26ML APPLICATOR (WOUND CARE) ×2 IMPLANT
ELECT REM PT RETURN 9FT ADLT (ELECTROSURGICAL) ×2
ELECTRODE REM PT RTRN 9FT ADLT (ELECTROSURGICAL) ×1 IMPLANT
GAUZE XEROFORM 1X8 LF (GAUZE/BANDAGES/DRESSINGS) ×2 IMPLANT
GLOVE BIOGEL PI IND STRL 8 (GLOVE) ×1 IMPLANT
GLOVE BIOGEL PI INDICATOR 8 (GLOVE) ×1
GLOVE ORTHO TXT STRL SZ7.5 (GLOVE) ×2 IMPLANT
GOWN PREVENTION PLUS LG XLONG (DISPOSABLE) IMPLANT
GOWN PREVENTION PLUS XLARGE (GOWN DISPOSABLE) ×2 IMPLANT
GOWN STRL NON-REIN LRG LVL3 (GOWN DISPOSABLE) ×2 IMPLANT
K-WIRE 1.6 (WIRE) ×1
K-WIRE FX5X1.6XNS BN SS (WIRE) ×1
KIT BASIN OR (CUSTOM PROCEDURE TRAY) ×2 IMPLANT
KIT ROOM TURNOVER OR (KITS) ×2 IMPLANT
KWIRE FX5X1.6XNS BN SS (WIRE) ×1 IMPLANT
MANIFOLD NEPTUNE II (INSTRUMENTS) IMPLANT
NEEDLE 22X1 1/2 (OR ONLY) (NEEDLE) ×2 IMPLANT
NS IRRIG 1000ML POUR BTL (IV SOLUTION) ×2 IMPLANT
PACK ORTHO EXTREMITY (CUSTOM PROCEDURE TRAY) ×2 IMPLANT
PAD ARMBOARD 7.5X6 YLW CONV (MISCELLANEOUS) ×4 IMPLANT
PAD CAST 3X4 CTTN HI CHSV (CAST SUPPLIES) ×1 IMPLANT
PAD CAST 4YDX4 CTTN HI CHSV (CAST SUPPLIES) ×1 IMPLANT
PADDING CAST COTTON 3X4 STRL (CAST SUPPLIES) ×1
PADDING CAST COTTON 4X4 STRL (CAST SUPPLIES) ×1
PEG SUBCHONDRAL SMOOTH 2.0X22 (Peg) ×6 IMPLANT
PEG THREADED 2.5MMX24MM LONG (Peg) ×2 IMPLANT
PLATE SHORT 24.4X51.3 RT (Plate) ×2 IMPLANT
SCREW BN 12X3.5XNS CORT TI (Screw) ×3 IMPLANT
SCREW CORT 3.5X12 (Screw) ×3 IMPLANT
SCREW PEG LOCK 2.5X18 (Peg) ×2 IMPLANT
SCREW PEG LOCK 2.5X24 (Peg) ×2 IMPLANT
SPONGE GAUZE 4X4 12PLY (GAUZE/BANDAGES/DRESSINGS) ×2 IMPLANT
SPONGE LAP 4X18 X RAY DECT (DISPOSABLE) ×2 IMPLANT
STRIP CLOSURE SKIN 1/2X4 (GAUZE/BANDAGES/DRESSINGS) ×2 IMPLANT
SUCTION FRAZIER TIP 10 FR DISP (SUCTIONS) ×2 IMPLANT
SUT ETHILON 3 0 PS 1 (SUTURE) ×2 IMPLANT
SUT PROLENE 3 0 PS 1 (SUTURE) ×2 IMPLANT
SUT VIC AB 2-0 CT1 27 (SUTURE) ×1
SUT VIC AB 2-0 CT1 TAPERPNT 27 (SUTURE) ×1 IMPLANT
SUT VIC AB 3-0 X1 27 (SUTURE) ×2 IMPLANT
SUT VICRYL 4-0 PS2 18IN ABS (SUTURE) IMPLANT
SYR CONTROL 10ML LL (SYRINGE) ×2 IMPLANT
TOWEL OR 17X24 6PK STRL BLUE (TOWEL DISPOSABLE) ×2 IMPLANT
TOWEL OR 17X26 10 PK STRL BLUE (TOWEL DISPOSABLE) ×2 IMPLANT
TUBE CONNECTING 12X1/4 (SUCTIONS) ×2 IMPLANT
UNDERPAD 30X30 INCONTINENT (UNDERPADS AND DIAPERS) ×2 IMPLANT
WATER STERILE IRR 1000ML POUR (IV SOLUTION) ×2 IMPLANT

## 2012-02-14 NOTE — Anesthesia Procedure Notes (Signed)
Anesthesia Regional Block:  Supraclavicular block  Pre-Anesthetic Checklist: ,, timeout performed, Correct Patient, Correct Site, Correct Laterality, Correct Procedure, Correct Position, site marked, Risks and benefits discussed,  Surgical consent,  Pre-op evaluation,  At surgeon's request and post-op pain management  Laterality: Right  Prep: chloraprep       Needles:  Injection technique: Single-shot  Needle Type: Echogenic Stimulator Needle     Needle Length: 5cm 5 cm Needle Gauge: 22 and 22 G    Additional Needles:  Procedures: ultrasound guided (picture in chart) and nerve stimulator Supraclavicular block  Nerve Stimulator or Paresthesia:  Response: 0.48 mA,   Additional Responses:   Narrative:  Start time: 02/14/2012 3:24 PM End time: 02/14/2012 3:36 PM Injection made incrementally with aspirations every 3 mL. Anesthesiologist: Dr Gypsy Balsam  Additional Notes: [id number] R Supraclav N erve Block POP CHG prep, sterile tech #22 stim/echo needle after iv sedation with stim down to .48ma and good US guidance with pix in chart Marc .5% w/epi 25cc+decadron 4mg  infiltrated Multiple neg asp No compl Dr Gypsy Balsam

## 2012-02-14 NOTE — Preoperative (Signed)
Beta Blockers   Reason not to administer Beta Blockers:Not Applicable 

## 2012-02-14 NOTE — Transfer of Care (Signed)
Immediate Anesthesia Transfer of Care Note  Patient: Erin Mckee  Procedure(s) Performed: Procedure(s) (LRB) with comments: OPEN REDUCTION INTERNAL FIXATION (ORIF) DISTAL RADIAL FRACTURE (Right) - Open Reduction Internal Fixation Right Wrist  Patient Location: PACU  Anesthesia Type:MAC  Level of Consciousness: sedated  Airway & Oxygen Therapy: Patient Spontanous Breathing and Patient connected to face mask oxygen  Post-op Assessment: Report given to PACU RN and Post -op Vital signs reviewed and stable  Post vital signs: Reviewed and stable  Complications: No apparent anesthesia complications

## 2012-02-14 NOTE — H&P (Signed)
Erin Mckee is an 66 y.o. female.   Chief Complaint: right wrist pain; known distal radius fracture HPI:   66 yo right-handed female who fell 2 days ago off of a horse onto an outstretched right wrist.  Went to Urgent Care and was found to have a displaced and angulated distal radius fracture.  Due to the unstable nature of this injury, it is recommended that she undergo surgical stabilization of the fracture.  The risks and benefits have been explained and fully understood.  Past Medical History  Diagnosis Date  . GERD (gastroesophageal reflux disease)   . Hepatitis     hep- a as a child  . Neuromuscular disorder     spasmodic dysphonia   . 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 Heart murmur     resolved- related to stress of throat closing , echo  done 2009  . Hypertension     PCP manages htn, pt. also reports that she has seen Cardiology  at [facility] too.     Past Surgical History  Procedure Date  . Tonsillectomy     2009    History reviewed. No pertinent family history. Social History:  reports that she has never smoked. She does not have any smokeless tobacco history on file. She reports that she drinks alcohol. She reports that she does not use illicit drugs.  Allergies: No Known Allergies  Medications Prior to Admission  Medication Sig Dispense Refill  . amitriptyline (ELAVIL) 25 MG tablet Take 25 mg by mouth daily with breakfast.      . hydrochlorothiazide (HYDRODIURIL) 25 MG tablet Take 25 mg by mouth daily with breakfast.      . LORazepam (ATIVAN) 0.5 MG tablet Take 1 mg by mouth 2 (two) times daily as needed. Once per day as needed.      . metoprolol succinate (TOPROL-XL) 50 MG 24 hr tablet Take 50 mg by mouth daily with breakfast. Take with or immediately following a meal.      . omeprazole (PRILOSEC) 20 MG capsule Take 20 mg by mouth daily as needed.      . fish oil-omega-3 fatty acids 1000 MG capsule Take 1 g by mouth daily at 8 pm.          Results for orders placed during the hospital encounter of 02/14/12 (from the past 48 hour(s))  BASIC METABOLIC PANEL     Status: Abnormal   Collection Time   02/13/12  2:00 PM      Component Value Range Comment   Sodium 138  135 - 145 mEq/L    Potassium 3.9  3.5 - 5.1 mEq/L    Chloride 98  96 - 112 mEq/L    CO2 29  19 - 32 mEq/L    Glucose, Bld 117 (*) 70 - 99 mg/dL    BUN 10  6 - 23 mg/dL    Creatinine, Ser 1.61  0.50 - 1.10 mg/dL    Calcium 9.7  8.4 - 09.6 mg/dL    GFR calc non Af Amer 88 (*) >90 mL/min    GFR calc Af Amer >90  >90 mL/min   CBC     Status: Normal   Collection Time   02/13/12  2:00 PM      Component Value Range Comment   WBC 7.9  4.0 - 10.5 K/uL    RBC 4.50  3.87 - 5.11 MIL/uL    Hemoglobin 13.7  12.0 - 15.0 g/dL  HCT 40.4  36.0 - 46.0 %    MCV 89.8  78.0 - 100.0 fL    MCH 30.4  26.0 - 34.0 pg    MCHC 33.9  30.0 - 36.0 g/dL    RDW 16.1  09.6 - 04.5 %    Platelets 222  150 - 400 K/uL   SURGICAL PCR SCREEN     Status: Normal   Collection Time   02/13/12  2:14 PM      Component Value Range Comment   MRSA, PCR NEGATIVE  NEGATIVE    Staphylococcus aureus NEGATIVE  NEGATIVE    Dg Chest 2 View  02/13/2012  *RADIOLOGY REPORT*  Clinical Data: Hypertension  CHEST - 2 VIEW  Comparison: 09/17/2007  Findings: Calcified granulomas are again seen.  Some calcified lymph nodes in the mediastinum are noted as well.  The lungs are clear.  Cardiac shadow is within normal limits.  IMPRESSION: Findings consistent with prior granulomatous disease.  No acute abnormality is noted.   Original Report Authenticated By: Alcide Clever, M.D.     ROS  Blood pressure 133/89, pulse 61, temperature 98.1 F (36.7 C), temperature source Oral, resp. rate 11, SpO2 100.00%. Physical Exam  Constitutional: She is oriented to person, place, and time. She appears well-developed and well-nourished.  HENT:  Head: Normocephalic and atraumatic.  Eyes: EOM are normal. Pupils are equal, round,  and reactive to light.  Neck: Normal range of motion. Neck supple.  Cardiovascular: Normal rate and regular rhythm.   Respiratory: Effort normal and breath sounds normal.  GI: Soft. Bowel sounds are normal.  Musculoskeletal:       Right wrist: She exhibits bony tenderness, swelling and deformity.  Neurological: She is alert and oriented to person, place, and time.  Skin: Skin is warm and dry.  Psychiatric: She has a normal mood and affect.     Assessment/Plan Displaced and angulated right distal radius fracture (unstable fracture) 1) to the OR for open reduction/internal fixation of the right wrist using a distal radius volar plate/screws 2) admit for observation post-op  Erin Mckee Y 02/14/2012, 5:32 PM

## 2012-02-14 NOTE — Brief Op Note (Signed)
02/14/2012  7:08 PM  PATIENT:  Erin Mckee  66 y.o. female  PRE-OPERATIVE DIAGNOSIS:  Right distal radius fracture  POST-OPERATIVE DIAGNOSIS:  Right distal radius fracture  PROCEDURE:  Procedure(s) (LRB) with comments: OPEN REDUCTION INTERNAL FIXATION (ORIF) DISTAL RADIAL FRACTURE (Right) - Open Reduction Internal Fixation Right Wrist  SURGEON:  Surgeon(s) and Role:    * Kathryne Hitch, MD - Primary  PHYSICIAN ASSISTANT:   ASSISTANTS: none   ANESTHESIA:   regional and IV sedation  EBL:   minimal  BLOOD ADMINISTERED:none  DRAINS: none   SPECIMEN:  No Specimen  DISPOSITION OF SPECIMEN:  N/A  COUNTS:  YES  TOURNIQUET:   Total Tourniquet Time Documented: Upper Arm (Right) - 51 minutes  DICTATION: .Other Dictation: Dictation Number [id number]  PLAN OF CARE: Admit for overnight observation  PATIENT DISPOSITION:  PACU - hemodynamically stable.   Delay start of Pharmacological VTE agent (>24hrs) due to surgical blood loss or risk of bleeding: no

## 2012-02-14 NOTE — Anesthesia Postprocedure Evaluation (Signed)
  Anesthesia Post-op Note  Patient: Erin Mckee  Procedure(s) Performed: Procedure(s) (LRB) with comments: OPEN REDUCTION INTERNAL FIXATION (ORIF) DISTAL RADIAL FRACTURE (Right) - Open Reduction Internal Fixation Right Wrist  Patient Location: PACU  Anesthesia Type:MAC combined with regional for post-op pain  Level of Consciousness: awake  Airway and Oxygen Therapy: Patient Spontanous Breathing and Patient connected to face mask oxygen  Post-op Pain: none  Post-op Assessment: Post-op Vital signs reviewed, Patient's Cardiovascular Status Stable, Respiratory Function Stable, Patent Airway and No signs of Nausea or vomiting  Post-op Vital Signs: Reviewed and stable  Complications: No apparent anesthesia complications

## 2012-02-15 ENCOUNTER — Encounter (HOSPITAL_COMMUNITY): Payer: Self-pay | Admitting: Orthopaedic Surgery

## 2012-02-15 LAB — GLUCOSE, CAPILLARY: Glucose-Capillary: 241 mg/dL — ABNORMAL HIGH (ref 70–99)

## 2012-02-15 MED ORDER — OXYCODONE-ACETAMINOPHEN 5-325 MG PO TABS: 1.0000 | ORAL_TABLET | ORAL | Status: DC | PRN

## 2012-02-15 NOTE — Progress Notes (Signed)
Orthopedic Tech Progress Note Patient Details:  Erin Mckee 1945/10/11 [id number] Velcro wrist splint applied to Right wrist; tolerated well. Ortho Devices Type of Ortho Device: Velcro wrist splint Ortho Device/Splint Location: Right Ortho Device/Splint Interventions: Application   Asia R Thompson 02/15/2012, 8:06 AM

## 2012-02-15 NOTE — Op Note (Signed)
NAMEEWELINA, NAVES NO.:  [id number]  MEDICAL RECORD NO.:  [id number]  LOCATION:  5N29C                        FACILITY:  [facility]  PHYSICIAN:  Vanita Panda. Magnus Ivan, M.D.DATE OF BIRTH:  1945/08/23  DATE OF PROCEDURE:  02/14/2012 DATE OF DISCHARGE:  02/15/2012                              OPERATIVE REPORT   PREOPERATIVE DIAGNOSIS:  Right displaced unstable intra-articular distal radius fracture.  POSTOPERATIVE DIAGNOSIS:  Right displaced unstable intra-articular distal radius fracture.  PROCEDURE:  Open reduction and internal fixation of right distal radius fracture.  IMPLANTS:  [employer] innovations distal volar radial plate (DVR).  SURGEON:  Vanita Panda. Magnus Ivan, M.D.  ANESTHESIA: 1. Regional right upper extremity block. 2. Mask ventilation IV sedation.  TOURNIQUET TIME:  Less than 1 hour.  ANTIBIOTICS:  2 g IV Ancef.  BLOOD LOSS:  Minimal.  COMPLICATIONS:  None.  INDICATIONS:  Rogoff is a 66 year old right-hand dominant female who slipped off of saddle, but was not secured on a horse 2 days ago.  She fell on outstretched right dominant wrist.  She was seen at the [facility] and found to have a displaced intra-articular distal radius fracture.  I then saw her in the office yesterday and recommended to the unstable nature of this injury.  She undergo open reduction and internal fixation of the fracture.  Risks and benefits of the surgery were explained to her in detail and she did wish to proceed with surgery.  PROCEDURE DESCRIPTION:  After informed consent was obtained, appropriate right wrist was marked.  Anesthesia was obtained with regional block. She was then brought to the operating room, placed supine on the operating table.  The right arm on the radiolucent arm table.  Mask ventilation, IV sedation was obtained.  A nonsterile tourniquet was placed on her upper right arm and her right hand and wrist and forearm were prepped  and draped with DuraPrep and sterile drapes.  A time-out was called to identify the correct patient and correct right wrist and then used an Esmarch to wrap out the wrist and tourniquet was inflated to 250 mmHg.  I then took a standard volar approach to the wrist made incision dissected down between the radial artery and the flexor carpi radialis tendon.  I dissected down to the pronator quadratus and divided this from radial to ulnar.  I then exposed the fracture in using a Freer and other retractors is able to tease the fracture into reduced position under direct visualization and direct fluoroscopy.  I then chose a [employer] standard with short plate and placed this along the volar cortex.  I secured this with bicortical screw proximally 1st and then with the fracture reduced position, I placed a combination of smooth pegs and screws to hold the fracture completely reduced.  I then placed 2 more bicortical screws proximally.  We assessed the wrist to flexion and extension, radial and ulnar deviation, it was stable.  She was noted to have radius or ulnar styloid fracture that we left alone.  I then cleaned the soft tissues with normal saline solution.  I closed this superficial tissue with interrupted 2-0 Vicryl suture followed by interrupted 3-0 nylon and skin.  Xeroform followed by a well-padded sterile dressing was applied.  She was taken to recovery room in stable condition.  All final counts were correct and no complications noted.     Vanita Panda. Magnus Ivan, M.D.     CYB/MEDQ  D:  02/14/2012  T:  02/15/2012  Job:  [id number]

## 2012-02-15 NOTE — Progress Notes (Signed)
Utilization Review Completed.Erin Mckee T11/15/2013   

## 2012-02-15 NOTE — Discharge Summary (Signed)
Patient ID: Erin Mckee MRN: [id number] DOB/AGE: 10-30-1945 66 y.o.  Admit date: 02/14/2012 Discharge date: 02/15/2012  Admission Diagnoses:  Principal Problem:  *Distal radius fracture, right   Discharge Diagnoses:  Same  Past Medical History  Diagnosis Date  . GERD (gastroesophageal reflux disease)   . Hepatitis     hep- a as a child  . Neuromuscular disorder     spasmodic dysphonia   . 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 Heart murmur     resolved- related to stress of throat closing , echo  done 2009  . Hypertension     PCP manages htn, pt. also reports that she has seen Cardiology  at [facility] too.     Surgeries: Procedure(s): OPEN REDUCTION INTERNAL FIXATION (ORIF) DISTAL RADIAL FRACTURE on 02/14/2012   Consultants:    Discharged Condition: Improved  Hospital Course: Erin Mckee is an 66 y.o. female who was admitted 02/14/2012 for operative treatment ofDistal radius fracture, right. Patient has severe unremitting pain that affects sleep, daily activities, and work/hobbies. After pre-op clearance the patient was taken to the operating room on 02/14/2012 and underwent  Procedure(s): OPEN REDUCTION INTERNAL FIXATION (ORIF) DISTAL RADIAL FRACTURE.    Patient was given perioperative antibiotics: Anti-infectives     Start     Dose/Rate Route Frequency Ordered Stop   02/14/12 2359   ceFAZolin (ANCEF) IVPB 1 g/50 mL premix        1 g 100 mL/hr over 30 Minutes Intravenous Every 6 hours 02/14/12 2256 02/15/12 1759   02/14/12 0000   ceFAZolin (ANCEF) IVPB 2 g/50 mL premix        2 g 100 mL/hr over 30 Minutes Intravenous 60 min pre-op 02/13/12 1442 02/14/12 1800           Patient was given sequential compression devices, early ambulation, and chemoprophylaxis to prevent DVT.  Patient benefited maximally from hospital stay and there were no complications.    Recent vital signs: Patient Vitals for the past 24 hrs:  BP Temp Temp src Pulse  Resp SpO2  02/14/12 2030 - 97 F (36.1 C) - - - -  02/14/12 1915 - 97.1 F (36.2 C) - - - -  02/14/12 1529 - - - 61  11  100 %  02/14/12 1525 - - - 62  13  100 %  02/14/12 1520 - - - 70  12  100 %  02/14/12 1519 - - - 71  14  100 %  02/14/12 1348 133/89 mmHg 98.1 F (36.7 C) Oral 77  20  100 %     Recent laboratory studies:  Basename 02/13/12 1400  WBC 7.9  HGB 13.7  HCT 40.4  PLT 222  NA 138  K 3.9  CL 98  CO2 29  BUN 10  CREATININE 0.70  GLUCOSE 117*  INR --  CALCIUM 9.7     Discharge Medications:     Medication List     As of 02/15/2012  6:42 AM    TAKE these medications         amitriptyline 25 MG tablet   Commonly known as: ELAVIL   Take 25 mg by mouth daily with breakfast.      hydrochlorothiazide 25 MG tablet   Commonly known as: HYDRODIURIL   Take 25 mg by mouth daily with breakfast.      ibuprofen 200 MG tablet   Commonly known as: ADVIL,MOTRIN   Take 400 mg by mouth  every 6 (six) hours as needed. For pain      LORazepam 0.5 MG tablet   Commonly known as: ATIVAN   Take 1 mg by mouth 2 (two) times daily as needed. For throat spasms      metoprolol succinate 50 MG 24 hr tablet   Commonly known as: TOPROL-XL   Take 50 mg by mouth daily with breakfast. Take with or immediately following a meal.      omeprazole 20 MG capsule   Commonly known as: PRILOSEC   Take 20 mg by mouth daily as needed. For acid reflux      oxyCODONE-acetaminophen 5-325 MG per tablet   Commonly known as: PERCOCET/ROXICET   Take 1-2 tablets by mouth every 4 (four) hours as needed for pain.        Diagnostic Studies: Dg Chest 2 View  02/13/2012  *RADIOLOGY REPORT*  Clinical Data: Hypertension  CHEST - 2 VIEW  Comparison: 09/17/2007  Findings: Calcified granulomas are again seen.  Some calcified lymph nodes in the mediastinum are noted as well.  The lungs are clear.  Cardiac shadow is within normal limits.  IMPRESSION: Findings consistent with prior granulomatous disease.   No acute abnormality is noted.   Original Report Authenticated By: Erin Mckee, M.D.    Dg Wrist Complete Right  02/11/2012  *RADIOLOGY REPORT*  Clinical Data: Trauma and pain.  RIGHT WRIST - COMPLETE 3+ VIEW  Comparison: None.  Findings: Mildly displaced ulnar styloid fracture.  Comminuted, impacted, dorsally angulated, intra-articular distal radius fracture.  Overlying soft tissue swelling.  IMPRESSION: Distal radius and ulnar fractures, as detailed.   Original Report Authenticated By: Erin Mckee, M.D.     Disposition: 01-Home or Self Care      Discharge Orders    Future Orders Please Complete By Expires   Diet - low sodium heart healthy      Call MD / Call 911      Comments:   If you experience chest pain or shortness of breath, CALL 911 and be transported to the hospital emergency room.  If you develope a fever above 101 F, pus (white drainage) or increased drainage or redness at the wound, or calf pain, call your surgeon's office.   Constipation Prevention      Comments:   Drink plenty of fluids.  Prune juice may be helpful.  You may use a stool softener, such as Colace (over the counter) 100 mg twice a day.  Use MiraLax (over the counter) for constipation as needed.   Increase activity slowly as tolerated      Discharge instructions      Comments:   Ice and elevation for swelling. Move your fingers and wrist as comfort allows. Come out of your velcro wrist splint as comfort allows. You can get your current dressing wet in the shower. You can get your actual incision wet in the shower starting this coming Monday 11/18, then large band-aids daily.   Discharge patient         Follow-up Information    Follow up with Erin Hitch, MD. In 2 weeks.   Contact information:   [address] [place] [address] [phone]           Signed: Kathryne Mckee 02/15/2012, 6:42 AM

## 2012-02-15 NOTE — Progress Notes (Signed)
Subjective: 1 Day Post-Op Procedure(s) (LRB): OPEN REDUCTION INTERNAL FIXATION (ORIF) DISTAL RADIAL FRACTURE (Right) Patient reports pain as mild.  Block still in effect.  Objective: Vital signs in last 24 hours: Temp:  [97 F (36.1 C)-98.1 F (36.7 C)] 97 F (36.1 C) (11/14 2030) Pulse Rate:  [61-77] 61  (11/14 1529) Resp:  [11-20] 11  (11/14 1529) BP: (133)/(89) 133/89 mmHg (11/14 1348) SpO2:  [100 %] 100 % (11/14 1529)  Intake/Output from previous day: 11/14 0701 - 11/15 0700 In: 800 [I.V.:800] Out: -  Intake/Output this shift:     Basename 02/13/12 1400  HGB 13.7    Basename 02/13/12 1400  WBC 7.9  RBC 4.50  HCT 40.4  PLT 222    Basename 02/13/12 1400  NA 138  K 3.9  CL 98  CO2 29  BUN 10  CREATININE 0.70  GLUCOSE 117*  CALCIUM 9.7   No results found for this basename: LABPT:2,INR:2 in the last 72 hours  Intact pulses distally Incision: no drainage No cellulitis present Compartment soft  Assessment/Plan: 1 Day Post-Op Procedure(s) (LRB): OPEN REDUCTION INTERNAL FIXATION (ORIF) DISTAL RADIAL FRACTURE (Right) Discharge to home today.  BLACKMAN,CHRISTOPHER Y 02/15/2012, 6:37 AM

## 2012-02-20 DIAGNOSIS — G2589 Other specified extrapyramidal and movement disorders: Secondary | ICD-10-CM | POA: Diagnosis not present

## 2012-02-20 DIAGNOSIS — R490 Dysphonia: Secondary | ICD-10-CM | POA: Diagnosis not present

## 2012-02-20 DIAGNOSIS — J383 Other diseases of vocal cords: Secondary | ICD-10-CM | POA: Insufficient documentation

## 2012-02-20 DIAGNOSIS — G248 Other dystonia: Secondary | ICD-10-CM | POA: Insufficient documentation

## 2012-03-03 DIAGNOSIS — S52539A Colles' fracture of unspecified radius, initial encounter for closed fracture: Secondary | ICD-10-CM | POA: Diagnosis not present

## 2012-03-13 ENCOUNTER — Ambulatory Visit (INDEPENDENT_AMBULATORY_CARE_PROVIDER_SITE_OTHER): Payer: Medicare Other | Admitting: Family Medicine

## 2012-03-13 VITALS — BP 155/90 | HR 84 | Temp 98.0°F | Resp 16 | Ht 66.0 in | Wt 164.2 lb

## 2012-03-13 DIAGNOSIS — J383 Other diseases of vocal cords: Secondary | ICD-10-CM | POA: Diagnosis not present

## 2012-03-13 DIAGNOSIS — E785 Hyperlipidemia, unspecified: Secondary | ICD-10-CM

## 2012-03-13 DIAGNOSIS — I10 Essential (primary) hypertension: Secondary | ICD-10-CM

## 2012-03-13 DIAGNOSIS — R490 Dysphonia: Secondary | ICD-10-CM

## 2012-03-13 MED ORDER — METOPROLOL SUCCINATE ER 50 MG PO TB24: 50.0000 mg | ORAL_TABLET | Freq: Every day | ORAL | Status: DC

## 2012-03-13 MED ORDER — LORAZEPAM 1 MG PO TABS: 1.0000 mg | ORAL_TABLET | Freq: Two times a day (BID) | ORAL | Status: DC | PRN

## 2012-03-13 MED ORDER — HYDROCHLOROTHIAZIDE 25 MG PO TABS: 25.0000 mg | ORAL_TABLET | Freq: Every day | ORAL | Status: DC

## 2012-03-13 MED ORDER — AMITRIPTYLINE HCL 25 MG PO TABS: 25.0000 mg | ORAL_TABLET | Freq: Every day | ORAL | Status: DC

## 2012-03-13 NOTE — Progress Notes (Signed)
Subjective:    Patient ID: Erin Mckee, female    DOB: 05-24-1945, 66 y.o.   MRN: [id number]  HPI Erin Mckee is a 67 y.o. 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  Recent worsening of symptoms since late summer. Lost ability to speak unless eating. Speech therapy eval in November - tonsilar pillars opening and closing in anticipation of swallowing food. Saw Dr. Montez Morita (ENT at [facility], prior Botox therapy there few years ago), planning on speech therapy at [facility] starting December 19th, and recommended neuro eval for sensation of feeling of throat closing. Feels same as eval at Dr. Montez Morita.   Social stressors - elderly parnts in [place], and her mother is close to passing. Her father is having a hard time with this, and patient's inability to speak has been difficult with this.  Still taking elavil and ativan, and taking 2 of the 0.5mg  ativans for right now with current sx's.  Denies depression symptoms.  HTN - outside BP's 110-140/70-90.  No new side effects of meds.   Hyperlipidemia - mild prior, treated  with fish oil.  No recent lipids. Fasting today.    Results for orders placed during the hospital encounter of 02/14/12  BASIC METABOLIC PANEL      Component Value Range   Sodium 138  135 - 145 mEq/L   Potassium 3.9  3.5 - 5.1 mEq/L   Chloride 98  96 - 112 mEq/L   CO2 29  19 - 32 mEq/L   Glucose, Bld 117 (*) 70 - 99 mg/dL   BUN 10  6 - 23 mg/dL   Creatinine, Ser 4.09  0.50 - 1.10 mg/dL   Calcium 9.7  8.4 - 81.1 mg/dL   GFR calc non Af Amer 88 (*) >90 mL/min   GFR calc Af Amer >90  >90 mL/min  CBC      Component Value Range   WBC 7.9  4.0 - 10.5 K/uL   RBC 4.50  3.87 - 5.11 MIL/uL   Hemoglobin 13.7  12.0 - 15.0 g/dL   HCT 91.4  78.2 - 95.6 %   MCV 89.8  78.0 - 100.0 fL   MCH 30.4  26.0 - 34.0 pg   MCHC 33.9  30.0 - 36.0 g/dL   RDW 21.3  08.6 - 57.8 %   Platelets 222  150 - 400 K/uL   SURGICAL PCR SCREEN      Component Value Range   MRSA, PCR NEGATIVE  NEGATIVE   Staphylococcus aureus NEGATIVE  NEGATIVE  GLUCOSE, CAPILLARY      Component Value Range   Glucose-Capillary 241 (*) 70 - 99 mg/dL      Review of Systems  Constitutional: Negative for fatigue and unexpected weight change.  HENT: Positive for voice change.   Respiratory: Negative for chest tightness and shortness of breath (not dyspneic, but feeling of tight throat. ).   Cardiovascular: Negative for chest pain, palpitations and leg swelling.  Gastrointestinal: Negative for abdominal pain and blood in stool.  Neurological: Negative for dizziness, syncope, light-headedness and headaches.       Objective:   Physical Exam  Constitutional: She is oriented to person, place, and time. She appears well-developed and well-nourished.  HENT:  Head: Normocephalic and atraumatic.  Eyes: Conjunctivae normal and EOM are normal. Pupils are equal, round, and reactive to light.  Neck: No JVD present. Carotid bruit is not present. No tracheal deviation present.  Hoarse, difficulty with speech. Uses tablet to communicate at times. No stridor on exam.  Cardiovascular: Normal rate, regular rhythm, normal heart sounds and intact distal pulses.   Pulmonary/Chest: Effort normal and breath sounds normal. No stridor. No respiratory distress. She has no wheezes.  Abdominal: Soft. She exhibits no pulsatile midline mass. There is no tenderness.  Neurological: She is alert and oriented to person, place, and time.  Skin: Skin is warm and dry.  Psychiatric: She has a normal mood and affect. Her behavior is normal.      Assessment & Plan:  Erin Mckee is a 66 y.o. female 1. Hypertension  Comprehensive metabolic panel, Lipid panel, hydrochlorothiazide (HYDRODIURIL) 25 MG tablet, metoprolol succinate (TOPROL-XL) 50 MG 24 hr tablet  2. Hyperlipidemia  Comprehensive metabolic panel, Lipid panel  3. Disorder of vocal cords   Ambulatory referral to Neurology  4. Dysphonia  amitriptyline (ELAVIL) 25 MG tablet, LORazepam (ATIVAN) 1 MG tablet, Ambulatory referral to Neurology   HTN - stable on home readings.  Check labs above.  Continue same meds - 6 month refills RX.  Hyperlipidemia - prior borderline, continue fish oil otc.  Vocal cord dysfunction/spasmodic dysphonia - prior stable, now with increase in symptoms since late summer, with worsened dysphonia - continue elavil, can increase ativan to 1mg  bid prn. Will refer to Dr. Sandria Manly at her request and recommendation of her treating ENT to eval for underlying neurologic cause of dysphonia. Continue to try ice chips or lozenges throughout day as needed to improve ability to speak and as symptomatically needed.   rtc precautions discussed.  Social stressors - denies depression sx's currently with familial and personal health stressors, but advised to let me know if she is having a difficult time with these stressors.   Patient Instructions  We will refer you to Dr. Imagene Gurney office for eval. meds were refilled for 6 months, but can keep follow up with me in January if needed to follow up on your throat symptoms and the labs we checked today. Your should receive a call or letter about your lab results within the next week to 10 days. Return to the clinic or go to the nearest emergency room if any of your symptoms worsen or new symptoms occur.

## 2012-03-13 NOTE — Patient Instructions (Signed)
We will refer you to Dr. Imagene Gurney office for eval. meds were refilled for 6 months, but can keep follow up with me in January if needed to follow up on your throat symptoms and the labs we checked today. Your should receive a call or letter about your lab results within the next week to 10 days. Return to the clinic or go to the nearest emergency room if any of your symptoms worsen or new symptoms occur.

## 2012-03-14 LAB — COMPREHENSIVE METABOLIC PANEL
ALT: 29 U/L (ref 0–35)
Alkaline Phosphatase: 102 U/L (ref 39–117)
CO2: 29 mEq/L (ref 19–32)
Creat: 0.62 mg/dL (ref 0.50–1.10)
Glucose, Bld: 101 mg/dL — ABNORMAL HIGH (ref 70–99)
Total Bilirubin: 0.5 mg/dL (ref 0.3–1.2)

## 2012-03-14 LAB — LIPID PANEL
Cholesterol: 261 mg/dL — ABNORMAL HIGH (ref 0–200)
Total CHOL/HDL Ratio: 4.1 Ratio
Triglycerides: 261 mg/dL — ABNORMAL HIGH (ref <150)
VLDL: 52 mg/dL — ABNORMAL HIGH (ref 0–40)

## 2012-03-20 DIAGNOSIS — R490 Dysphonia: Secondary | ICD-10-CM | POA: Diagnosis not present

## 2012-03-31 DIAGNOSIS — S52539A Colles' fracture of unspecified radius, initial encounter for closed fracture: Secondary | ICD-10-CM | POA: Diagnosis not present

## 2012-04-14 ENCOUNTER — Ambulatory Visit (INDEPENDENT_AMBULATORY_CARE_PROVIDER_SITE_OTHER): Payer: Medicare Other | Admitting: Family Medicine

## 2012-04-14 ENCOUNTER — Encounter: Payer: Self-pay | Admitting: Family Medicine

## 2012-04-14 VITALS — BP 136/91 | HR 100 | Temp 97.3°F | Resp 16 | Ht 64.75 in | Wt 166.6 lb

## 2012-04-14 DIAGNOSIS — Z124 Encounter for screening for malignant neoplasm of cervix: Secondary | ICD-10-CM | POA: Diagnosis not present

## 2012-04-14 DIAGNOSIS — Z0000 Encounter for general adult medical examination without abnormal findings: Secondary | ICD-10-CM | POA: Diagnosis not present

## 2012-04-14 DIAGNOSIS — Z23 Encounter for immunization: Secondary | ICD-10-CM

## 2012-04-14 DIAGNOSIS — Z139 Encounter for screening, unspecified: Secondary | ICD-10-CM

## 2012-04-14 MED ORDER — ZOSTER VACCINE LIVE 19400 UNT/0.65ML ~~LOC~~ SOLR: 0.6500 mL | Freq: Once | SUBCUTANEOUS | Status: DC

## 2012-04-14 MED ORDER — PNEUMOCOCCAL VAC POLYVALENT 25 MCG/0.5ML IJ INJ: 0.5000 mL | INJECTION | Freq: Once | INTRAMUSCULAR | Status: DC

## 2012-04-14 MED ORDER — PNEUMOCOCCAL VAC POLYVALENT 25 MCG/0.5ML IJ INJ: 0.5000 mL | INJECTION | INTRAMUSCULAR | Status: DC

## 2012-04-14 NOTE — Progress Notes (Signed)
  Subjective:    Patient ID: Erin Mckee, female    DOB: 1945-04-11, 67 y.o.   MRN: [id number]  HPI    Review of Systems  HENT: Positive for voice change.        No voice  Eyes:       Sensitive to light  Cardiovascular: Positive for leg swelling.       Objective:   Physical Exam        Assessment & Plan:

## 2012-04-14 NOTE — Progress Notes (Signed)
Subjective:    Patient ID: Erin Mckee, female    DOB: [birth year], 67 y.o.   MRN: [id number]  HPI Erin Mckee is a 67 y.o. female  Here for annual wellness visit.  No known hx of pneumovax. Would like rx for zostavax. Flu shot already this year - October.  Last mammogram in 2011 - normal.  Last pap in 2011. No hx of colonscopy. No FH of colon cancer, no blood in stool or dark tarry stools. Would like to wait to schedule after eval of dysphonia.   Hx of Vocal cord dysfunction/spasmodic dysphonia - see 03/13/12 ov.  prior stable, now with increase in symptoms since late summer, with worsened dysphonia - continued elavil, increased ativan to 1mg  bid prn. Referred to Dr. Sandria Manly at her request and recommendation of her treating ENT to eval for underlying neurologic cause of dysphonia. Continued to try ice chips or lozenges throughout day as needed to improve ability to speak and as symptomatically needed. Still using assistive device to write - has appt with neuro appt.  Jan 28th with neuro at [facility]. Same symptoms.   Vision noted - 20/50 on right. Wears corrective lenses. Last optho appt  - 2 years ago.   Beck scale 2 points - normal.   Review of Systems Noted on PHS - dysphonia, and photophobia.     Objective:   Physical Exam  Constitutional: She is oriented to person, place, and time. She appears well-developed and well-nourished.  HENT:  Head: Normocephalic and atraumatic.  Right Ear: External ear normal.  Left Ear: External ear normal.  Mouth/Throat: Oropharynx is clear and moist.  Eyes: Conjunctivae normal are normal. Pupils are equal, round, and reactive to light.  Neck: Normal range of motion. Neck supple. No tracheal deviation present. No thyromegaly present.       Hoarse voice when able to phonate  Cardiovascular: Normal rate, regular rhythm, normal heart sounds and intact distal pulses.   No murmur heard. Pulmonary/Chest: Effort normal and breath sounds normal. No  stridor. No respiratory distress. She has no wheezes.  Abdominal: Soft. Bowel sounds are normal. There is no tenderness.  Genitourinary: Vagina normal. Guaiac negative stool. No vaginal discharge found.  Musculoskeletal: Normal range of motion. She exhibits no edema and no tenderness.  Lymphadenopathy:    She has no cervical adenopathy.  Neurological: She is alert and oriented to person, place, and time.  Skin: Skin is warm and dry. No rash noted.  Psychiatric: She has a normal mood and affect. Her behavior is normal. Thought content normal.       Assessment & Plan:  Erin Mckee is a 67 y.o. female 1. Routine general medical examination at a health care facility  IFOBT POC (occult bld, rslt in office), Pap IG w/ reflex to HPV when ASC-U.  Annual wellness exam.  reviewed depression screening, vision as noted. Pap obtained. zostavax rx given to pt.  utd on tetanus and influenza. Pneumovax given.  Vision - decreased acuity and difficult with bright lights.  To discuss with optho and neuro. - pt to schedule follow up appt at Dr. Emily Filbert, and has neuro appt pending in few weeks.  rtc sooner if any worsening.  She will also schedule mammogram. Pap testing today - if normal, may not need further testing based on age. Hemosure  Negative, but recommended colonoscopy - screening. Declined referral for colonoscopy today - can schedule on own, or call me if referral needed. Understanding expressed.   2. Need for prophylactic vaccination  against Streptococcus pneumoniae (pneumococcus)  pneumococcal 23 valent vaccine (PNU-IMMUNE) injection 0.5 mL, DISCONTINUED: pneumococcal 23 valent vaccine (PNU-IMMUNE) injection 0.5 mL  3. Screening for unspecified condition  IFOBT POC (occult bld, rslt in office), Pap IG w/ reflex to HPV when ASC-U  4. Screening for malignant neoplasm of the cervix  IFOBT POC (occult bld, rslt in office), Pap IG w/ reflex to HPV when ASC-U  5. Need for Zostavax administration  zoster vaccine  live, PF, (ZOSTAVAX) 96045 UNT/0.65ML injection     Patient Instructions  Keep a record of your blood pressures outside of the office and bring them to the next office visit. Call and schedule a mammogram and follow up with your eye doctor.  Let us know if you need [place] to refer you for a colonoscopy.   Keeping You Healthy  Get These Tests  Blood Pressure- Have your blood pressure checked by your healthcare provider at least once a year.  Normal blood pressure is 120/80.  Weight- Have your body mass index (BMI) calculated to screen for obesity.  BMI is a measure of body fat based on height and weight.  You can calculate your own BMI at https://www.west-esparza.com/  Cholesterol- Have your cholesterol checked every year.  Diabetes- Have your blood sugar checked every year if you have high blood pressure, high cholesterol, a family history of diabetes or if you are overweight.  Pap Smear- Have a pap smear every 1 to 3 years if you have been sexually active.  If you are older than 65 and recent pap smears have been normal you may not need additional pap smears.  In addition, if you have had a hysterectomy  For benign disease additional pap smears are not necessary.  Mammogram-Yearly mammograms are essential for early detection of breast cancer  Screening for Colon Cancer- Colonoscopy starting at age 1. Screening may begin sooner depending on your family history and other health conditions.  Follow up colonoscopy as directed by your Gastroenterologist.  Screening for Osteoporosis- Screening begins at age 47 with bone density scanning, sooner if you are at higher risk for developing Osteoporosis.  Get these medicines  Calcium with Vitamin D- Your body requires 1200-1500 mg of Calcium a day and [phone] IU of Vitamin D a day.  You can only absorb 500 mg of Calcium at a time therefore Calcium must be taken in 2 or 3 separate doses throughout the day.  Hormones- Hormone therapy has been  associated with increased risk for certain cancers and heart disease.  Talk to your healthcare provider about if you need relief from menopausal symptoms.  Aspirin- Ask your healthcare provider about taking Aspirin to prevent Heart Disease and Stroke.  Get these Immuniztions  Flu shot- Every fall  Pneumonia shot- Once after the age of 40; if you are younger ask your healthcare provider if you need a pneumonia shot.  Tetanus- Every ten years.  Zostavax- Once after the age of 65 to prevent shingles.  Take these steps  Don't smoke- Your healthcare provider can help you quit. For tips on how to quit, ask your healthcare provider or go to www.smokefree.gov or call 1-800 QUIT-NOW.  Be physically active- Exercise 5 days a week for a minimum of 30 minutes.  If you are not already physically active, start slow and gradually work up to 30 minutes of moderate physical activity.  Try walking, dancing, bike riding, swimming, etc.  Eat a healthy diet- Eat a variety of healthy foods such as fruits, vegetables,  whole grains, low fat milk, low fat cheeses, yogurt, lean meats, chicken, fish, eggs, dried beans, tofu, etc.  For more information go to www.thenutritionsource.org  Dental visit- Brush and floss teeth twice daily; visit your dentist twice a year.  Eye exam- Visit your Optometrist or Ophthalmologist yearly.  Drink alcohol in moderation- Limit alcohol intake to one drink or less a day.  Never drink and drive.  Depression- Your emotional health is as important as your physical health.  If you're feeling down or losing interest in things you normally enjoy, please talk to your healthcare provider.  Seat Belts- can save your life; always wear one  Smoke/Carbon Monoxide detectors- These detectors need to be installed on the appropriate level of your home.  Replace batteries at least once a year.  Violence- If anyone is threatening or hurting you, please tell your healthcare provider.  Living  Will/ Health care power of attorney- Discuss with your healthcare provider and family.

## 2012-04-14 NOTE — Patient Instructions (Addendum)
Keep a record of your blood pressures outside of the office and bring them to the next office visit. Call and schedule a mammogram and follow up with your eye doctor.  Let us know if you need [place] to refer you for a colonoscopy.   Keeping You Healthy  Get These Tests  Blood Pressure- Have your blood pressure checked by your healthcare provider at least once a year.  Normal blood pressure is 120/80.  Weight- Have your body mass index (BMI) calculated to screen for obesity.  BMI is a measure of body fat based on height and weight.  You can calculate your own BMI at https://www.west-esparza.com/  Cholesterol- Have your cholesterol checked every year.  Diabetes- Have your blood sugar checked every year if you have high blood pressure, high cholesterol, a family history of diabetes or if you are overweight.  Pap Smear- Have a pap smear every 1 to 3 years if you have been sexually active.  If you are older than 65 and recent pap smears have been normal you may not need additional pap smears.  In addition, if you have had a hysterectomy  For benign disease additional pap smears are not necessary.  Mammogram-Yearly mammograms are essential for early detection of breast cancer  Screening for Colon Cancer- Colonoscopy starting at age 49. Screening may begin sooner depending on your family history and other health conditions.  Follow up colonoscopy as directed by your Gastroenterologist.  Screening for Osteoporosis- Screening begins at age 62 with bone density scanning, sooner if you are at higher risk for developing Osteoporosis.  Get these medicines  Calcium with Vitamin D- Your body requires 1200-1500 mg of Calcium a day and [phone] IU of Vitamin D a day.  You can only absorb 500 mg of Calcium at a time therefore Calcium must be taken in 2 or 3 separate doses throughout the day.  Hormones- Hormone therapy has been associated with increased risk for certain cancers and heart disease.  Talk to your  healthcare provider about if you need relief from menopausal symptoms.  Aspirin- Ask your healthcare provider about taking Aspirin to prevent Heart Disease and Stroke.  Get these Immuniztions  Flu shot- Every fall  Pneumonia shot- Once after the age of 40; if you are younger ask your healthcare provider if you need a pneumonia shot.  Tetanus- Every ten years.  Zostavax- Once after the age of 53 to prevent shingles.  Take these steps  Don't smoke- Your healthcare provider can help you quit. For tips on how to quit, ask your healthcare provider or go to www.smokefree.gov or call 1-800 QUIT-NOW.  Be physically active- Exercise 5 days a week for a minimum of 30 minutes.  If you are not already physically active, start slow and gradually work up to 30 minutes of moderate physical activity.  Try walking, dancing, bike riding, swimming, etc.  Eat a healthy diet- Eat a variety of healthy foods such as fruits, vegetables, whole grains, low fat milk, low fat cheeses, yogurt, lean meats, chicken, fish, eggs, dried beans, tofu, etc.  For more information go to www.thenutritionsource.org  Dental visit- Brush and floss teeth twice daily; visit your dentist twice a year.  Eye exam- Visit your Optometrist or Ophthalmologist yearly.  Drink alcohol in moderation- Limit alcohol intake to one drink or less a day.  Never drink and drive.  Depression- Your emotional health is as important as your physical health.  If you're feeling down or losing interest in things you  normally enjoy, please talk to your healthcare provider.  Seat Belts- can save your life; always wear one  Smoke/Carbon Monoxide detectors- These detectors need to be installed on the appropriate level of your home.  Replace batteries at least once a year.  Violence- If anyone is threatening or hurting you, please tell your healthcare provider.  Living Will/ Health care power of attorney- Discuss with your healthcare provider and  family.

## 2012-04-28 DIAGNOSIS — S52539A Colles' fracture of unspecified radius, initial encounter for closed fracture: Secondary | ICD-10-CM | POA: Diagnosis not present

## 2012-04-29 DIAGNOSIS — G2589 Other specified extrapyramidal and movement disorders: Secondary | ICD-10-CM | POA: Diagnosis not present

## 2012-04-29 DIAGNOSIS — R490 Dysphonia: Secondary | ICD-10-CM | POA: Diagnosis not present

## 2012-09-26 ENCOUNTER — Other Ambulatory Visit: Payer: Self-pay | Admitting: Family Medicine

## 2012-10-16 ENCOUNTER — Ambulatory Visit (INDEPENDENT_AMBULATORY_CARE_PROVIDER_SITE_OTHER): Payer: Medicare Other | Admitting: Family Medicine

## 2012-10-16 VITALS — BP 122/84 | HR 78 | Temp 98.5°F | Resp 18 | Ht 64.75 in | Wt 172.6 lb

## 2012-10-16 DIAGNOSIS — I10 Essential (primary) hypertension: Secondary | ICD-10-CM | POA: Diagnosis not present

## 2012-10-16 DIAGNOSIS — R490 Dysphonia: Secondary | ICD-10-CM

## 2012-10-16 DIAGNOSIS — J383 Other diseases of vocal cords: Secondary | ICD-10-CM | POA: Diagnosis not present

## 2012-10-16 DIAGNOSIS — E785 Hyperlipidemia, unspecified: Secondary | ICD-10-CM

## 2012-10-16 MED ORDER — LORAZEPAM 1 MG PO TABS: 1.0000 mg | ORAL_TABLET | Freq: Two times a day (BID) | ORAL | Status: DC | PRN

## 2012-10-16 MED ORDER — HYDROCHLOROTHIAZIDE 25 MG PO TABS: 25.0000 mg | ORAL_TABLET | Freq: Every day | ORAL | Status: DC

## 2012-10-16 MED ORDER — METOPROLOL SUCCINATE ER 50 MG PO TB24: 50.0000 mg | ORAL_TABLET | Freq: Every day | ORAL | Status: DC

## 2012-10-16 MED ORDER — AMITRIPTYLINE HCL 25 MG PO TABS: 25.0000 mg | ORAL_TABLET | Freq: Every day | ORAL | Status: DC

## 2012-10-16 NOTE — Progress Notes (Signed)
Subjective:    Patient ID: Erin Mckee, female    DOB: [birth year], 67 y.o.   MRN: [id number]  HPI Erin Mckee is a 67 y.o. female  Annual wellness visit in January.   HTN - prior stable at 03/2012 ov. 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  See prior ov's.  Recent worsening of symptoms since late summer. Lost ability to speak unless eating. Speech therapy eval in November - tonsilar pillars opening and closing in anticipation of swallowing food. Saw Dr. Montez Morita (ENT at [facility], prior Botox therapy there few years ago), speech therapy at [facility] - not very helpful, neuro eval for sensation of feeling of throat closing - appt with neuro Jan 28th at [facility]. Started on Artane.  Seems to help some. Able to speak better if having gum in mouth.  continued elavil, increased ativan to 1mg  bid prn.  Anxiety with above and ativan to help with mm spasm as above.  No new side effects of meds - ativan usually once per day, twice per day if 12 hour shift. 3-4 days per week.   Social stressors - elderly parents. Mom passed in February, but doing ok with this - some relief. denies depression sx's.    Grandson born in April.    Hyperlipidemia - mild prior, treated  with fish oil.  No recent lipids. Fasting today. LDL 146 No early CAD in family.    Review of Systems  Constitutional: Negative for fatigue and unexpected weight change.  HENT: Negative for voice change (no worsening and able to phonate better now. ).   Respiratory: Negative for chest tightness and shortness of breath.   Cardiovascular: Negative for chest pain, palpitations and leg swelling.  Gastrointestinal: Negative for abdominal pain and blood in stool.  Neurological: Negative for dizziness, syncope, light-headedness and headaches.      Objective:   Physical Exam  Vitals reviewed. Constitutional: She is oriented to person,  place, and time. She appears well-developed and well-nourished.  HENT:  Head: Normocephalic and atraumatic.  Eyes: Conjunctivae and EOM are normal. Pupils are equal, round, and reactive to light.  Neck: Carotid bruit is not present.  Cardiovascular: Normal rate, regular rhythm, normal heart sounds and intact distal pulses.   Pulmonary/Chest: Effort normal and breath sounds normal.  Abdominal: Soft. She exhibits no pulsatile midline mass. There is no tenderness.  Neurological: She is alert and oriented to person, place, and time.  Skin: Skin is warm and dry.  Psychiatric: She has a normal mood and affect. Her behavior is normal.       Assessment & Plan:  Erin Mckee is a 67 y.o. female  Unspecified essential hypertension - Plan: Comprehensive metabolic panel, hydrochlorothiazide (HYDRODIURIL) 25 MG tablet, metoprolol succinate (TOPROL-XL) 50 MG 24 hr tablet. Stable.  refilled meds.   Other and unspecified hyperlipidemia - Plan: Lipid panel.  Based on 10 year risk of ASCVD, if levels still elevated on lipid panel , would consider statin, but this can be discussed.   Vocal cord dysfunction - Plan: LORazepam (ATIVAN) 1 MG tablet, amitriptyline (ELAVIL) 25 MG tablet - stable/improved on Artane. No change in meds.   Recheck in approx 6 months.    Meds ordered this encounter  Medications  . trihexyphenidyl (ARTANE) 2 MG tablet    Sig: Take 2 mg by mouth 3 (three) times daily with meals.  Marland Kitchen LORazepam (ATIVAN) 1 MG  tablet    Sig: Take 1 tablet (1 mg total) by mouth 2 (two) times daily as needed. For throat spasms    Dispense:  60 tablet    Refill:  2  . hydrochlorothiazide (HYDRODIURIL) 25 MG tablet    Sig: Take 1 tablet (25 mg total) by mouth daily.    Dispense:  90 tablet    Refill:  1  . amitriptyline (ELAVIL) 25 MG tablet    Sig: Take 1 tablet (25 mg total) by mouth at bedtime.    Dispense:  90 tablet    Refill:  1  . metoprolol succinate (TOPROL-XL) 50 MG 24 hr tablet     Sig: Take 1 tablet (50 mg total) by mouth daily. PATIENT NEEDS OFFICE VISIT FOR ADDITIONAL REFILLS    Dispense:  90 tablet    Refill:  1     Patient Instructions  You should receive a call or letter about your lab results within the next week to 10 days.  If cholesterol still elevated, would consider trial of low dose statin.   Return to the clinic or go to the nearest emergency room if any of your symptoms worsen or new symptoms occur.

## 2012-10-16 NOTE — Patient Instructions (Signed)
You should receive a call or letter about your lab results within the next week to 10 days.  If cholesterol still elevated, would consider trial of low dose statin.   Return to the clinic or go to the nearest emergency room if any of your symptoms worsen or new symptoms occur.

## 2012-10-17 LAB — LIPID PANEL
Cholesterol: 252 mg/dL — ABNORMAL HIGH (ref 0–200)
LDL Cholesterol: 153 mg/dL — ABNORMAL HIGH (ref 0–99)
Triglycerides: 194 mg/dL — ABNORMAL HIGH (ref <150)

## 2012-10-17 LAB — COMPREHENSIVE METABOLIC PANEL
Albumin: 4.5 g/dL (ref 3.5–5.2)
CO2: 30 mEq/L (ref 19–32)
Calcium: 10.4 mg/dL (ref 8.4–10.5)
Glucose, Bld: 101 mg/dL — ABNORMAL HIGH (ref 70–99)
Potassium: 4.6 mEq/L (ref 3.5–5.3)
Sodium: 140 mEq/L (ref 135–145)
Total Protein: 7.2 g/dL (ref 6.0–8.3)

## 2013-02-05 ENCOUNTER — Other Ambulatory Visit: Payer: Self-pay

## 2013-03-13 DIAGNOSIS — H2510 Age-related nuclear cataract, unspecified eye: Secondary | ICD-10-CM | POA: Diagnosis not present

## 2013-05-22 ENCOUNTER — Ambulatory Visit (INDEPENDENT_AMBULATORY_CARE_PROVIDER_SITE_OTHER): Payer: Medicare Other | Admitting: Family Medicine

## 2013-05-22 VITALS — BP 126/82 | HR 79 | Temp 98.2°F | Resp 16 | Ht 64.5 in | Wt 171.8 lb

## 2013-05-22 DIAGNOSIS — J383 Other diseases of vocal cords: Secondary | ICD-10-CM

## 2013-05-22 DIAGNOSIS — E785 Hyperlipidemia, unspecified: Secondary | ICD-10-CM

## 2013-05-22 DIAGNOSIS — I10 Essential (primary) hypertension: Secondary | ICD-10-CM | POA: Diagnosis not present

## 2013-05-22 DIAGNOSIS — R490 Dysphonia: Secondary | ICD-10-CM | POA: Diagnosis not present

## 2013-05-22 LAB — COMPLETE METABOLIC PANEL WITH GFR
ALK PHOS: 86 U/L (ref 39–117)
ALT: 38 U/L — ABNORMAL HIGH (ref 0–35)
AST: 30 U/L (ref 0–37)
Albumin: 4.6 g/dL (ref 3.5–5.2)
BILIRUBIN TOTAL: 0.5 mg/dL (ref 0.2–1.2)
BUN: 12 mg/dL (ref 6–23)
CO2: 30 meq/L (ref 19–32)
CREATININE: 0.72 mg/dL (ref 0.50–1.10)
Calcium: 9.9 mg/dL (ref 8.4–10.5)
Chloride: 104 mEq/L (ref 96–112)
GFR, EST NON AFRICAN AMERICAN: 87 mL/min
GLUCOSE: 87 mg/dL (ref 70–99)
Potassium: 4.6 mEq/L (ref 3.5–5.3)
SODIUM: 140 meq/L (ref 135–145)
Total Protein: 7.1 g/dL (ref 6.0–8.3)

## 2013-05-22 LAB — LIPID PANEL
CHOL/HDL RATIO: 4.7 ratio
CHOLESTEROL: 254 mg/dL — AB (ref 0–200)
HDL: 54 mg/dL (ref >39)
LDL Cholesterol: 158 mg/dL — ABNORMAL HIGH (ref 0–99)
TRIGLYCERIDES: 211 mg/dL — AB (ref <150)
VLDL: 42 mg/dL — ABNORMAL HIGH (ref 0–40)

## 2013-05-22 MED ORDER — AMITRIPTYLINE HCL 25 MG PO TABS: 25.0000 mg | ORAL_TABLET | Freq: Every day | ORAL | Status: DC

## 2013-05-22 MED ORDER — LORAZEPAM 1 MG PO TABS: 1.0000 mg | ORAL_TABLET | Freq: Two times a day (BID) | ORAL | Status: DC | PRN

## 2013-05-22 MED ORDER — HYDROCHLOROTHIAZIDE 25 MG PO TABS: 25.0000 mg | ORAL_TABLET | Freq: Every day | ORAL | Status: DC

## 2013-05-22 MED ORDER — METOPROLOL SUCCINATE ER 50 MG PO TB24: 50.0000 mg | ORAL_TABLET | Freq: Every day | ORAL | Status: DC

## 2013-05-22 NOTE — Patient Instructions (Signed)
No change in meds today, You should receive a call or letter about your lab results within the next week to 10 days. Depending on results may need to check hemoglobin A1c for prior borderline elevated blood sugar and may need to discuss a statin for cholesterol. Return to the clinic or go to the nearest emergency room if any of your symptoms worsen or new symptoms occur.

## 2013-05-22 NOTE — Progress Notes (Signed)
Subjective:    Patient ID: Erin Mckee, female    DOB: Sep 17, 1945, 68 y.o.   MRN: [id number] This chart was scribed for Merri Ray, MD by Vernell Barrier, Medical Scribe. This patient's care was started at 1:19 PM.  HPI HPI Comments: Erin Mckee is a 68 y.o. female who presents to the [facility] for medication refill. Last office visit was July 2014.  1. HTN: CMP overall normal in July 2014 with creatinine 0.80. No changes. Regular readings around 130/80. Denies any side effects of medicine. Denies any missed doses.   2. Hyperlipidemia: Last cholesterol testing was July 2014 with total cholesterol of 252. Trig 194. HDL 60. LDL 153. Borderline glucose noted on prior labs at 101. Pt states she has gained some weight but is slowly starting to lose it. Weighs 171 today and was 172 back in July 2014. Has not had anything to eat in the past 8 hours except gum and medication. Pt is fasting.  3. Dysphonia: Vocal cord dysfunction; followed by ENT and speech therapy at [facility] prior. Also has seen neurology and started on Artane last January 2014. Has been managed with Elavil and Ativan to help with muscle spasm. Taken Ativan 1-2x per day in the past with stable sx. States Artane is working for blepharo spasms; but not for her throat. Lorazepam is the only thing that works for throat. Tried Botox 1 year ago and states it would provide a different result each time; working for about 3 months each time. Says she would choke on food and lose her voice because it would relax every muscle. Takes Lorazepam 1x/day most days unless working long shifts, then she will take 2x/day. If not working at all, states she will not take any.  Patient Active Problem List   Diagnosis Date Noted  . Distal radius fracture, right 02/14/2012  . Hyperlipidemia 09/13/2011  . HYPERTENSION 01/15/2008  . 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 DYSPNEA 01/15/2008  . PALPITATIONS, HX OF 01/15/2008   Past Medical History    Diagnosis Date  . GERD (gastroesophageal reflux disease)   . Hepatitis     hep- a as a child  . Neuromuscular disorder     spasmodic dysphonia   . 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 Heart murmur     resolved- related to stress of throat closing , echo  done 2009  . Hypertension     PCP manages htn, pt. also reports that she has seen Cardiology  at [facility] too.   Marland Kitchen Hyperlipidemia    Past Surgical History  Procedure Laterality Date  . Tonsillectomy      2009  . Open reduction internal fixation (orif) distal radial fracture  02/14/2012    Procedure: OPEN REDUCTION INTERNAL FIXATION (ORIF) DISTAL RADIAL FRACTURE;  Surgeon: Mcarthur Rossetti, MD;  Location: [facility];  Service: Orthopedics;  Laterality: Right;  Open Reduction Internal Fixation Right Wrist  . Fracture surgery     No Known Allergies Prior to Admission medications   Medication Sig Start Date End Date Taking? Authorizing Provider  amitriptyline (ELAVIL) 25 MG tablet Take 1 tablet (25 mg total) by mouth at bedtime. 10/16/12  Yes [name], MD  fish oil-omega-3 fatty acids 1000 MG capsule Take 2 g by mouth daily.   Yes Historical Provider, MD  hydrochlorothiazide (HYDRODIURIL) 25 MG tablet Take 1 tablet (25 mg total) by mouth daily. 10/16/12  Yes [name], MD  LORazepam (ATIVAN)  1 MG tablet Take 1 tablet (1 mg total) by mouth 2 (two) times daily as needed. For throat spasms 10/16/12  Yes [name], MD  metoprolol succinate (TOPROL-XL) 50 MG 24 hr tablet Take 1 tablet (50 mg total) by mouth daily. PATIENT NEEDS OFFICE VISIT FOR ADDITIONAL REFILLS 10/16/12  Yes [name], MD  trihexyphenidyl (ARTANE) 2 MG tablet Take 2 mg by mouth 3 (three) times daily with meals.   Yes Historical Provider, MD  zoster vaccine live, PF, (ZOSTAVAX) 63785 UNT/0.65ML injection Inject 19,400 Units into the skin once. 04/14/12  Yes [name], MD  ibuprofen (ADVIL,MOTRIN) 200 MG tablet Take 400 mg by  mouth every 6 (six) hours as needed. For pain    Historical Provider, MD  omeprazole (PRILOSEC) 20 MG capsule Take 20 mg by mouth daily as needed. For acid reflux    Historical Provider, MD  oxyCODONE-acetaminophen (ROXICET) 5-325 MG per tablet Take 1-2 tablets by mouth every 4 (four) hours as needed for pain. 02/15/12   Mcarthur Rossetti, MD   History   Social History  . Marital Status: Married    Spouse Name: N/A    Number of Children: N/A  . Years of Education: N/A   Occupational History  . Not on file.   Social History Main Topics  . Smoking status: Never Smoker   . Smokeless tobacco: Not on file  . Alcohol Use: 3.6 oz/week    6 Glasses of wine per week     Comment: daily- one beer  . Drug Use: No  . Sexual Activity: Yes    Birth Control/ Protection: None   Other Topics Concern  . Not on file   Social History Narrative  . No narrative on file   Review of Systems  Constitutional: Negative for fatigue and unexpected weight change.  Respiratory: Negative for cough, chest tightness and shortness of breath.   Cardiovascular: Negative for chest pain, palpitations and leg swelling.  Gastrointestinal: Negative for abdominal pain and blood in stool.  Neurological: Negative for dizziness, syncope, light-headedness and headaches.   Objective:   Physical Exam  Vitals reviewed. Constitutional: She is oriented to person, place, and time. She appears well-developed and well-nourished.  HENT:  Head: Normocephalic and atraumatic.  Right Ear: External ear normal.  Left Ear: External ear normal.  Mouth/Throat: Oropharynx is clear and moist.  Eyes: Conjunctivae and EOM are normal. Pupils are equal, round, and reactive to light.  Neck: Carotid bruit is not present.  Cardiovascular: Normal rate, regular rhythm, normal heart sounds and intact distal pulses.   Pulmonary/Chest: Effort normal and breath sounds normal.  Abdominal: Soft. She exhibits no pulsatile midline mass. There  is no tenderness.  Neurological: She is alert and oriented to person, place, and time.  Skin: Skin is warm and dry.  Psychiatric: She has a normal mood and affect. Her behavior is normal.    Filed Vitals:   05/22/13 1220  BP: 126/82  Pulse: 79  Temp: 98.2 F (36.8 C)  TempSrc: Oral  Resp: 16  Height: 5' 4.5" (1.638 m)  Weight: 171 lb 12.8 oz (77.928 kg)  SpO2: 95%   Assessment & Plan:   Erin Mckee is a 68 y.o. female HTN (hypertension) - Plan: COMPLETE METABOLIC PANEL WITH GFR, Lipid panel  - controlled. No change in meds. Prior borderline hyperglycemia - fasting today except chewing gum.   Other and unspecified hyperlipidemia - Plan: COMPLETE METABOLIC PANEL WITH GFR, Lipid panel -continue to watch  diet, if elevated, may need to start statin.  Dysphonia - Plan: LORazepam (ATIVAN) 1 MG tablet, amitriptyline (ELAVIL) 25 MG tablet, Vocal cord dysfunction - Plan: LORazepam (ATIVAN) 1 MG tablet, amitriptyline (ELAVIL) 25 MG tablet.  - stable, no changes in meds.    recheck in 6-9 months.   Meds ordered this encounter  Medications  . metoprolol succinate (TOPROL-XL) 50 MG 24 hr tablet    Sig: Take 1 tablet (50 mg total) by mouth daily.    Dispense:  90 tablet    Refill:  2  . LORazepam (ATIVAN) 1 MG tablet    Sig: Take 1 tablet (1 mg total) by mouth 2 (two) times daily as needed. For throat spasms    Dispense:  60 tablet    Refill:  3  . hydrochlorothiazide (HYDRODIURIL) 25 MG tablet    Sig: Take 1 tablet (25 mg total) by mouth daily.    Dispense:  90 tablet    Refill:  2  . amitriptyline (ELAVIL) 25 MG tablet    Sig: Take 1 tablet (25 mg total) by mouth at bedtime.    Dispense:  90 tablet    Refill:  2   Patient Instructions  No change in meds today, You should receive a call or letter about your lab results within the next week to 10 days. Depending on results may need to check hemoglobin A1c for prior borderline elevated blood sugar and may need to discuss a  statin for cholesterol. Return to the clinic or go to the nearest emergency room if any of your symptoms worsen or new symptoms occur.   I personally performed the services described in this documentation, which was scribed in my presence. The recorded information has been reviewed and considered, and addended by me as needed.

## 2013-06-09 ENCOUNTER — Encounter: Payer: Self-pay | Admitting: Family Medicine

## 2013-11-04 ENCOUNTER — Other Ambulatory Visit: Payer: Self-pay

## 2013-11-04 DIAGNOSIS — Z1231 Encounter for screening mammogram for malignant neoplasm of breast: Secondary | ICD-10-CM

## 2013-11-10 ENCOUNTER — Ambulatory Visit
Admission: RE | Admit: 2013-11-10 | Discharge: 2013-11-10 | Disposition: A | Discharge status: Home / Self Care | Payer: BLUE CROSS/BLUE SHIELD | Source: Ambulatory Visit

## 2013-11-10 DIAGNOSIS — Z1231 Encounter for screening mammogram for malignant neoplasm of breast: Secondary | ICD-10-CM | POA: Diagnosis not present

## 2013-12-21 ENCOUNTER — Encounter: Payer: Self-pay | Admitting: Family Medicine

## 2013-12-21 DIAGNOSIS — J383 Other diseases of vocal cords: Secondary | ICD-10-CM

## 2013-12-21 DIAGNOSIS — R490 Dysphonia: Secondary | ICD-10-CM

## 2013-12-21 MED ORDER — LORAZEPAM 1 MG PO TABS: 1.0000 mg | ORAL_TABLET | Freq: Two times a day (BID) | ORAL | Status: DC | PRN

## 2013-12-21 NOTE — Telephone Encounter (Signed)
See [employer] email. Will write for #[phone], with 1 refill. Can sign in am for pt to pick up

## 2014-02-12 ENCOUNTER — Other Ambulatory Visit: Payer: Self-pay | Admitting: Family Medicine

## 2014-02-22 ENCOUNTER — Ambulatory Visit (INDEPENDENT_AMBULATORY_CARE_PROVIDER_SITE_OTHER): Payer: Medicare Other | Admitting: Family Medicine

## 2014-02-22 ENCOUNTER — Encounter: Payer: Self-pay | Admitting: Family Medicine

## 2014-02-22 VITALS — BP 140/87 | HR 69 | Temp 98.0°F | Resp 16 | Ht 64.5 in | Wt 165.0 lb

## 2014-02-22 DIAGNOSIS — E785 Hyperlipidemia, unspecified: Secondary | ICD-10-CM | POA: Diagnosis not present

## 2014-02-22 DIAGNOSIS — R490 Dysphonia: Secondary | ICD-10-CM

## 2014-02-22 DIAGNOSIS — Z23 Encounter for immunization: Secondary | ICD-10-CM

## 2014-02-22 DIAGNOSIS — J383 Other diseases of vocal cords: Secondary | ICD-10-CM | POA: Diagnosis not present

## 2014-02-22 DIAGNOSIS — I10 Essential (primary) hypertension: Secondary | ICD-10-CM

## 2014-02-22 DIAGNOSIS — Z1211 Encounter for screening for malignant neoplasm of colon: Secondary | ICD-10-CM | POA: Diagnosis not present

## 2014-02-22 DIAGNOSIS — Z0000 Encounter for general adult medical examination without abnormal findings: Secondary | ICD-10-CM | POA: Diagnosis not present

## 2014-02-22 LAB — LIPID PANEL
CHOLESTEROL: 243 mg/dL — AB (ref 0–200)
HDL: 65 mg/dL (ref >39)
LDL Cholesterol: 147 mg/dL — ABNORMAL HIGH (ref 0–99)
TRIGLYCERIDES: 157 mg/dL — AB (ref <150)
Total CHOL/HDL Ratio: 3.7 Ratio
VLDL: 31 mg/dL (ref 0–40)

## 2014-02-22 LAB — COMPLETE METABOLIC PANEL WITH GFR
ALT: 34 U/L (ref 0–35)
AST: 30 U/L (ref 0–37)
Albumin: 4.6 g/dL (ref 3.5–5.2)
Alkaline Phosphatase: 84 U/L (ref 39–117)
BILIRUBIN TOTAL: 0.5 mg/dL (ref 0.2–1.2)
BUN: 15 mg/dL (ref 6–23)
CALCIUM: 10.4 mg/dL (ref 8.4–10.5)
CHLORIDE: 99 meq/L (ref 96–112)
CO2: 25 meq/L (ref 19–32)
CREATININE: 0.75 mg/dL (ref 0.50–1.10)
GFR, EST NON AFRICAN AMERICAN: 82 mL/min
GLUCOSE: 95 mg/dL (ref 70–99)
Potassium: 3.7 mEq/L (ref 3.5–5.3)
Sodium: 138 mEq/L (ref 135–145)
Total Protein: 7.3 g/dL (ref 6.0–8.3)

## 2014-02-22 MED ORDER — HYDROCHLOROTHIAZIDE 25 MG PO TABS: ORAL_TABLET | ORAL | Status: DC

## 2014-02-22 MED ORDER — LORAZEPAM 1 MG PO TABS: 1.0000 mg | ORAL_TABLET | Freq: Two times a day (BID) | ORAL | Status: DC | PRN

## 2014-02-22 MED ORDER — ZOSTER VACCINE LIVE 19400 UNT/0.65ML ~~LOC~~ SOLR: 0.6500 mL | Freq: Once | SUBCUTANEOUS | Status: DC

## 2014-02-22 MED ORDER — METOPROLOL SUCCINATE ER 50 MG PO TB24: ORAL_TABLET | ORAL | Status: DC

## 2014-02-22 NOTE — Progress Notes (Addendum)
Subjective:    Patient ID: Erin Mckee, female    DOB: May 07, 1945, 68 y.o.   MRN: [id number] This chart was scribed for Erin Agreste, MD by Cathie Hoops, ED Scribe. The patient was seen in Room 28. The patient's care was started at 4:56 PM.    02/22/2014  Chief Complaint  Patient presents with  . Annual Exam   HPI HPI Comments: She is here for a subsequent Medicare wellness visit. Pt was last seen by me on 05/22/2013.  Erin Mckee is a 68 y.o. female who presents to the [facility] here for a complete physical exam.  HTN: Controlled at last office visit. Pt notes she checks her BP at home and states it's normally ranges about 130/90. Pt denies chest pains, light-headedness or dizziness with her medication at this time. Pt notes she is compliant with HCTZ 25 mg and Toprol-XL 50 mg q.d and requires medication refills for these medications. She denies needing a refill of Ativan at this time.  Hyperlipidemia: Elevated lipids in February of this year. Her ASCVD risk was approximately 10%, recommended statin medication. Pt notes her parents had heart disease at ages 56 and 4. Pt denies any changes in her diet or exercise. Pt states she last ate at 11:00 AM.  Dysphonia (Vocal Cord Disorder): See prior office visits. This has been managed with Elavil and Ativan to help with the muscle spasm. This has been followed by [facility] ENT, speech therapy, and neurology. Pt states she is still taking Ativan. She started Artane, and stopped Elavil. She notes she takes Ativan bid on days where she she needs to talk. She notes her condition has been the same. She notes her Artane helps with her vision changes.  Health Maintenance 1.) Health history reviewed per pt health history form.   2.) Colon Cancer Screening: Declined colonoscopy at last physical with plan to have this done after evaluation of her dysphonia. She had a negative Hemasure on January 2014. Pt notes she  would like to have an appointment in April 2016 due to some traveling she will have to do.  3.) Immunizations      A. Tdap: 2010      B. Influenza: September 2015      C. Pneumovax: January 2014/ Prevnar given today.      D. Zostavax: She was given a Zostvax prescription at physical in January 2014. Pt denies receiving her Zostavax due to some personal issues but would like it to be rewritten for one today.  4.) Hearing Screening: She denies any problems with her hearing at this time.   5.) Optho/Eye Care Her opthalmologist is Dr. Delman Cheadle and has seen both opthalmology and neurology for visual changes with bright lights in the past.  6.) Fall Screening: denies falls in past year.  7.) Depression Screening: 0 out of 2 on PHQ. Pt denies feeling down or depressed. She notes she retires April 09, 2014.  8.) Functional Status survey without concerning findings per chart screening tool.    Patient Active Problem List   Diagnosis Date Noted  . Distal radius fracture, right 02/14/2012  . Hyperlipidemia 09/13/2011  . HYPERTENSION 01/15/2008  . 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 DYSPNEA 01/15/2008  . PALPITATIONS, HX OF 01/15/2008   Past Medical History  Diagnosis Date  . GERD (gastroesophageal reflux disease)   . Hepatitis     hep- a as a child  . Neuromuscular disorder     spasmodic dysphonia   .  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 Heart murmur     resolved- related to stress of throat closing , echo  done 2009  . Hypertension     PCP manages htn, pt. also reports that she has seen Cardiology  at [facility] too.   Marland Kitchen Hyperlipidemia    Past Surgical History  Procedure Laterality Date  . Tonsillectomy      2009  . Open reduction internal fixation (orif) distal radial fracture  02/14/2012    Procedure: OPEN REDUCTION INTERNAL FIXATION (ORIF) DISTAL RADIAL FRACTURE;  Surgeon: Mcarthur Rossetti, MD;  Location: [facility];  Service: Orthopedics;  Laterality: Right;   Open Reduction Internal Fixation Right Wrist  . Fracture surgery     No Known Allergies Prior to Admission medications   Medication Sig Start Date End Date Taking? Authorizing Provider  fish oil-omega-3 fatty acids 1000 MG capsule Take 2 g by mouth daily.   Yes Historical Provider, MD  hydrochlorothiazide (HYDRODIURIL) 25 MG tablet TAKE 1 TABLET BY MOUTH EVERY DAY 02/12/14  Yes Chelle S Jeffery, PA-C  LORazepam (ATIVAN) 1 MG tablet Take 1 tablet (1 mg total) by mouth 2 (two) times daily as needed. For throat spasms 12/21/13  Yes Erin Agreste, MD  metoprolol succinate (TOPROL-XL) 50 MG 24 hr tablet TAKE 1 TABLET BY MOUTH EVERY DAY 02/12/14  Yes Chelle S Jeffery, PA-C  trihexyphenidyl (ARTANE) 2 MG tablet Take 2 mg by mouth 3 (three) times daily with meals.   Yes Historical Provider, MD  amitriptyline (ELAVIL) 25 MG tablet Take 1 tablet (25 mg total) by mouth at bedtime. Patient not taking: Reported on 02/22/2014 05/22/13   Erin Agreste, MD   History   Social History  . Marital Status: Married    Spouse Name: N/A    Number of Children: N/A  . Years of Education: N/A   Occupational History  . Not on file.   Social History Main Topics  . Smoking status: Never Smoker   . Smokeless tobacco: Not on file  . Alcohol Use: 3.6 oz/week    6 Glasses of wine per week     Comment: daily- one beer  . Drug Use: No  . Sexual Activity: Yes    Birth Control/ Protection: None   Other Topics Concern  . Not on file   Social History Narrative   Industrial/product designer   Married   Exercises      Review of Systems 13 point ROS per pt survey. Negative other than listed above or in nursing note.  Objective:   Filed Vitals:   02/22/14 1621  BP: 140/87  Pulse: 69  Temp: 98 F (36.7 C)  TempSrc: Oral  Resp: 16  Height: 5' 4.5" (1.638 m)  Weight: 165 lb (74.844 kg)  SpO2: 96%    Physical Exam  Constitutional: She is oriented to person, place, and time. She appears well-developed  and well-nourished.  HENT:  Head: Normocephalic and atraumatic.  Right Ear: External ear normal.  Left Ear: External ear normal.  Nose: Nose normal.  Mouth/Throat: Oropharynx is clear and moist.  Eyes: Conjunctivae and EOM are normal. Pupils are equal, round, and reactive to light.  Neck: Normal range of motion. Neck supple. Carotid bruit is not present. No thyromegaly present.  Cardiovascular: Normal rate, regular rhythm, normal heart sounds and intact distal pulses.   No murmur heard. Pulmonary/Chest: Effort normal and breath sounds normal. No respiratory distress. She has  no wheezes.  Abdominal: Soft. Bowel sounds are normal. She exhibits no pulsatile midline mass. There is no tenderness.  Musculoskeletal: Normal range of motion. She exhibits no edema or tenderness.  Lymphadenopathy:    She has no cervical adenopathy.  Neurological: She is alert and oriented to person, place, and time.  Skin: Skin is warm and dry. No rash noted.  Psychiatric: She has a normal mood and affect. Her behavior is normal. Thought content normal.  Vitals reviewed.    Visual Acuity Screening   Right eye Left eye Both eyes  Without correction:     With correction: 20/30 20/30 20/30     Assessment & Plan:  5:13 PM- Patient informed of current plan for treatment and evaluation and agrees with plan at this time.  Bonne Whack is a 68 y.o. female Encounter for Medicare annual wellness exam  -screening as discussed in HPI. No concerning components.  Was amenable to referral for colonoscopy this spring.   Dysphonia -, vocal cord dysfunction - Plan: LORazepam (ATIVAN) 1 MG tablet  -cont Artane and follow up with neuro, ENT, and cont lorazepam as this helps with voice. No change in the tolerance or use of this medicine at this time, and dosing stable. . Call for refills.   Essential hypertension - Plan: hydrochlorothiazide (HYDRODIURIL) 25 MG tablet, metoprolol succinate (TOPROL-XL) 50 MG 24 hr tablet,  COMPLETE METABOLIC PANEL WITH GFR  -stable overall, no med changes- refilled.   Hyperlipidemia - Plan: COMPLETE METABOLIC PANEL WITH GFR, Lipid panel  -Lipid panel pending.   Need for shingles vaccine - Plan: zoster vaccine live, PF, (ZOSTAVAX) 58527 UNT/0.65ML injection  -repeat Rx given.   Screening for colon cancer - Plan: Ambulatory referral to Gastroenterology  -as above.   Meds ordered this encounter  Medications  . LORazepam (ATIVAN) 1 MG tablet    Sig: Take 1 tablet (1 mg total) by mouth 2 (two) times daily as needed. For throat spasms    Dispense:  60 tablet    Refill:  1  . hydrochlorothiazide (HYDRODIURIL) 25 MG tablet    Sig: TAKE 1 TABLET BY MOUTH EVERY DAY    Dispense:  90 tablet    Refill:  1  . metoprolol succinate (TOPROL-XL) 50 MG 24 hr tablet    Sig: TAKE 1 TABLET BY MOUTH EVERY DAY    Dispense:  90 tablet    Refill:  1  . zoster vaccine live, PF, (ZOSTAVAX) 78242 UNT/0.65ML injection    Sig: Inject 19,400 Units into the skin once.    Dispense:  1 each    Refill:  0   There are no Patient Instructions on file for this visit.   I personally performed the services described in this documentation, which was scribed in my presence. The recorded information has been reviewed and considered, and addended by me as needed.

## 2014-02-22 NOTE — Progress Notes (Signed)
   Subjective:    Patient ID: Erin Mckee, female    DOB: [birth year], 68 y.o.   MRN: [id number]  HPI    Review of Systems  Constitutional: Negative.   HENT: Positive for voice change.   Eyes: Positive for photophobia.  Respiratory: Negative.   Cardiovascular: Negative.   Gastrointestinal: Negative.   Endocrine: Negative.   Genitourinary: Negative.   Musculoskeletal: Negative.   Skin: Negative.   Allergic/Immunologic: Negative.   Neurological: Positive for speech difficulty.  Hematological: Negative.   Psychiatric/Behavioral: Negative.        Objective:   Physical Exam        Assessment & Plan:

## 2014-03-04 ENCOUNTER — Encounter: Payer: Self-pay | Admitting: Radiology

## 2014-04-02 HISTORY — PX: COLONOSCOPY: SHX174

## 2014-05-22 ENCOUNTER — Telehealth: Payer: Self-pay

## 2014-05-22 NOTE — Telephone Encounter (Signed)
PA started for Elavil.

## 2014-07-19 ENCOUNTER — Encounter: Payer: Self-pay | Admitting: Family Medicine

## 2014-07-30 ENCOUNTER — Encounter: Payer: Self-pay | Admitting: Gastroenterology

## 2014-08-18 ENCOUNTER — Encounter: Payer: Self-pay | Admitting: Family Medicine

## 2014-08-23 ENCOUNTER — Other Ambulatory Visit: Payer: Self-pay | Admitting: Family Medicine

## 2014-08-23 ENCOUNTER — Encounter: Payer: Self-pay | Admitting: Family Medicine

## 2014-08-23 DIAGNOSIS — R490 Dysphonia: Secondary | ICD-10-CM

## 2014-08-23 DIAGNOSIS — J383 Other diseases of vocal cords: Secondary | ICD-10-CM

## 2014-08-23 MED ORDER — LORAZEPAM 1 MG PO TABS: 1.0000 mg | ORAL_TABLET | Freq: Two times a day (BID) | ORAL | Status: DC | PRN

## 2014-08-24 NOTE — Telephone Encounter (Signed)
Rx was faxed to pharm.

## 2014-10-12 ENCOUNTER — Ambulatory Visit (AMBULATORY_SURGERY_CENTER): Payer: Self-pay

## 2014-10-12 ENCOUNTER — Telehealth: Payer: Self-pay

## 2014-10-12 VITALS — Ht 65.0 in | Wt 169.2 lb

## 2014-10-12 DIAGNOSIS — Z1211 Encounter for screening for malignant neoplasm of colon: Secondary | ICD-10-CM

## 2014-10-12 MED ORDER — SUPREP BOWEL PREP KIT 17.5-3.13-1.6 GM/177ML PO SOLN: 1.0000 | Freq: Once | ORAL | Status: DC

## 2014-10-12 NOTE — Telephone Encounter (Signed)
Has vocal cord spasms where "throat closes up/spasms" with talking; "doesnt interfer with breathing"  FYI

## 2014-10-12 NOTE — Progress Notes (Signed)
No allergies to eggs or soy No diet/weight loss meds No home oxygen "throat closes/spasms" Pt says doesn't interfer with breathing just talking  Has email  Emmi instructions given for colonoscopy

## 2014-10-26 ENCOUNTER — Ambulatory Visit (AMBULATORY_SURGERY_CENTER): Payer: Medicare Other | Admitting: Gastroenterology

## 2014-10-26 ENCOUNTER — Encounter: Payer: Self-pay | Admitting: Gastroenterology

## 2014-10-26 VITALS — BP 110/58 | HR 68 | Temp 97.5°F | Resp 18 | Ht 66.0 in | Wt 169.0 lb

## 2014-10-26 DIAGNOSIS — K621 Rectal polyp: Secondary | ICD-10-CM

## 2014-10-26 DIAGNOSIS — K219 Gastro-esophageal reflux disease without esophagitis: Secondary | ICD-10-CM | POA: Diagnosis not present

## 2014-10-26 DIAGNOSIS — D128 Benign neoplasm of rectum: Secondary | ICD-10-CM

## 2014-10-26 DIAGNOSIS — Z1211 Encounter for screening for malignant neoplasm of colon: Secondary | ICD-10-CM

## 2014-10-26 DIAGNOSIS — E669 Obesity, unspecified: Secondary | ICD-10-CM | POA: Diagnosis not present

## 2014-10-26 DIAGNOSIS — D122 Benign neoplasm of ascending colon: Secondary | ICD-10-CM

## 2014-10-26 DIAGNOSIS — K648 Other hemorrhoids: Secondary | ICD-10-CM

## 2014-10-26 DIAGNOSIS — B1920 Unspecified viral hepatitis C without hepatic coma: Secondary | ICD-10-CM | POA: Diagnosis not present

## 2014-10-26 MED ORDER — SODIUM CHLORIDE 0.9 % IV SOLN: 500.0000 mL | INTRAVENOUS | Status: DC

## 2014-10-26 NOTE — Progress Notes (Signed)
Called to room to assist during endoscopic procedure.  Patient ID and intended procedure confirmed with present staff. Received instructions for my participation in the procedure from the performing physician.  

## 2014-10-26 NOTE — Patient Instructions (Signed)
YOU HAD AN ENDOSCOPIC PROCEDURE TODAY AT THE  ENDOSCOPY CENTER:   Refer to the procedure report that was given to you for any specific questions about what was found during the examination.  If the procedure report does not answer your questions, please call your gastroenterologist to clarify.  If you requested that your care partner not be given the details of your procedure findings, then the procedure report has been included in a sealed envelope for you to review at your convenience later.  YOU SHOULD EXPECT: Some feelings of bloating in the abdomen. Passage of more gas than usual.  Walking can help get rid of the air that was put into your GI tract during the procedure and reduce the bloating. If you had a lower endoscopy (such as a colonoscopy or flexible sigmoidoscopy) you may notice spotting of blood in your stool or on the toilet paper. If you underwent a bowel prep for your procedure, you may not have a normal bowel movement for a few days.  Please Note:  You might notice some irritation and congestion in your nose or some drainage.  This is from the oxygen used during your procedure.  There is no need for concern and it should clear up in a day or so.  SYMPTOMS TO REPORT IMMEDIATELY:   Following lower endoscopy (colonoscopy or flexible sigmoidoscopy):  Excessive amounts of blood in the stool  Significant tenderness or worsening of abdominal pains  Swelling of the abdomen that is new, acute  Fever of 100F or higher   For urgent or emergent issues, a gastroenterologist can be reached at any hour by calling [phone].   DIET: Your first meal following the procedure should be a small meal and then it is ok to progress to your normal diet. Heavy or fried foods are harder to digest and may make you feel nauseous or bloated.  Likewise, meals heavy in dairy and vegetables can increase bloating.  Drink plenty of fluids but you should avoid alcoholic beverages for 24  hours.  ACTIVITY:  You should plan to take it easy for the rest of today and you should NOT DRIVE or use heavy machinery until tomorrow (because of the sedation medicines used during the test).    FOLLOW UP: Our staff will call the number listed on your records the next business day following your procedure to check on you and address any questions or concerns that you may have regarding the information given to you following your procedure. If we do not reach you, we will leave a message.  However, if you are feeling well and you are not experiencing any problems, there is no need to return our call.  We will assume that you have returned to your regular daily activities without incident.  If any biopsies were taken you will be contacted by phone or by letter within the next 1-3 weeks.  Please call us at [phone] if you have not heard about the biopsies in 3 weeks.    SIGNATURES/CONFIDENTIALITY: You and/or your care partner have signed paperwork which will be entered into your electronic medical record.  These signatures attest to the fact that that the information above on your After Visit Summary has been reviewed and is understood.  Full responsibility of the confidentiality of this discharge information lies with you and/or your care-partner.    Resume medications. Information given on polyps. 

## 2014-10-26 NOTE — Progress Notes (Signed)
Report to PACU, RN, vss, BBS= Clear.  

## 2014-10-26 NOTE — Op Note (Signed)
[place]  [address] [place], [address]   COLONOSCOPY PROCEDURE REPORT  PATIENT: Pietra, Zuluaga  MR#: [id number] BIRTHDATE: 1945/10/29 , 69  yrs. old GENDER: female ENDOSCOPIST: Inda Castle, MD REFERRED AU:QJFHLKT Greene, M.D. PROCEDURE DATE:  10/26/2014 PROCEDURE:   Colonoscopy, screening, Colonoscopy with cold biopsy polypectomy, and Colonoscopy with snare polypectomy First Screening Colonoscopy - Avg.  risk and is 50 yrs.  old or older Yes.  Prior Negative Screening - Now for repeat screening. N/A  History of Adenoma - Now for follow-up colonoscopy & has been > or = to 3 yrs.  N/A  Polyps removed today? Yes ASA CLASS:   Class II INDICATIONS:Colorectal Neoplasm Risk Assessment for this procedure is average risk. MEDICATIONS: Monitored anesthesia care, Propofol 200 mg IV, and Lidocaine 140 mg IV  DESCRIPTION OF PROCEDURE:   After the risks benefits and alternatives of the procedure were thoroughly explained, informed consent was obtained.  The digital rectal exam revealed no abnormalities of the rectum.   The LB [id number]  endoscope was introduced through the anus and advanced to the cecum, which was identified by both the appendix and ileocecal valve. No adverse events experienced.   The quality of the prep was (Suprep was used) excellent.  The instrument was then slowly withdrawn as the colon was fully examined. Estimated blood loss is zero unless otherwise noted in this procedure report.      COLON FINDINGS: A sessile polyp measuring 4 mm in size was found in the ascending colon.  A polypectomy was performed with a cold snare.  The resection was complete, the polyp tissue was completely retrieved and sent to histology.   A sessile polyp measuring 2 mm in size was found in the rectum.  A polypectomy was performed with cold forceps.   Internal hemorrhoids were found.  Retroflexed views revealed no abnormalities. The time to cecum = 5.0  Withdrawal time = 7.5   The scope was withdrawn and the procedure completed. COMPLICATIONS: There were no immediate complications.  ENDOSCOPIC IMPRESSION: 1.   Sessile polyp was found in the ascending colon; polypectomy was performed with a cold snare 2.   Sessile polyp was found in the rectum; polypectomy was performed with cold forceps 3.   Internal hemorrhoids  RECOMMENDATIONS: If the polyp(s) removed today are proven to be adenomatous (pre-cancerous) polyps, you will need a repeat colonoscopy in 5 years.  Otherwise you should continue to follow colorectal cancer screening guidelines for "routine risk" patients with colonoscopy in 10 years.  You will receive a letter within 1-2 weeks with the results of your biopsy as well as final recommendations.  Please call my office if you have not received a letter after 3 weeks.  eSigned:  Inda Castle, MD 10/26/2014 10:45 AM   cc:   PATIENT NAME:  Efrat, Zuidema MR#: [id number]

## 2014-10-27 ENCOUNTER — Telehealth: Payer: Self-pay

## 2014-10-27 NOTE — Telephone Encounter (Signed)
Left a message at [phone] for the pt to call us back if she has any questions or concerns. maw

## 2014-11-01 ENCOUNTER — Encounter: Payer: Self-pay | Admitting: Gastroenterology

## 2014-11-05 ENCOUNTER — Other Ambulatory Visit: Payer: Self-pay | Admitting: Family Medicine

## 2014-11-08 ENCOUNTER — Other Ambulatory Visit: Payer: Self-pay | Admitting: Urgent Care

## 2014-12-15 ENCOUNTER — Other Ambulatory Visit: Payer: Self-pay | Admitting: Urgent Care

## 2014-12-29 ENCOUNTER — Ambulatory Visit (INDEPENDENT_AMBULATORY_CARE_PROVIDER_SITE_OTHER): Payer: Medicare Other | Admitting: Family Medicine

## 2014-12-29 VITALS — BP 152/80 | HR 62 | Temp 98.2°F | Resp 16 | Ht 66.0 in | Wt 166.0 lb

## 2014-12-29 DIAGNOSIS — R490 Dysphonia: Secondary | ICD-10-CM

## 2014-12-29 DIAGNOSIS — E785 Hyperlipidemia, unspecified: Secondary | ICD-10-CM

## 2014-12-29 DIAGNOSIS — I10 Essential (primary) hypertension: Secondary | ICD-10-CM | POA: Diagnosis not present

## 2014-12-29 DIAGNOSIS — J383 Other diseases of vocal cords: Secondary | ICD-10-CM | POA: Diagnosis not present

## 2014-12-29 MED ORDER — METOPROLOL SUCCINATE ER 50 MG PO TB24: 50.0000 mg | ORAL_TABLET | Freq: Every day | ORAL | Status: DC

## 2014-12-29 MED ORDER — LORAZEPAM 1 MG PO TABS: 1.0000 mg | ORAL_TABLET | Freq: Two times a day (BID) | ORAL | Status: DC | PRN

## 2014-12-29 MED ORDER — HYDROCHLOROTHIAZIDE 25 MG PO TABS: 25.0000 mg | ORAL_TABLET | Freq: Every day | ORAL | Status: DC

## 2014-12-29 NOTE — Progress Notes (Signed)
Subjective:    Patient ID: Erin Mckee, female    DOB: 08/04/1945, 69 y.o.   MRN: [id number] This chart was scribed for Merri Ray, MD by Marti Sleigh, Medical Scribe. This patient was seen in Room 14 and the patient's care was started a 6:24 PM.  Chief Complaint  Patient presents with  . Medication Refill    hydrochlorothiazide, lorazepam, and metoprolol    HPI HPI Comments: Erin Mckee is a 69 y.o. female who presents to [facility] her for medication refill. Pt states her BP has been around 140/90 at home. She denies CP or SOB. She denies new side affects with her medications. 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 She has not changed her diet or exercise level, and her weight has not changed. She takes she is taking lorazepam 10 times per month.   HTN: She takes HCTZ and omeprazole  Lab Results  Component Value Date   CREATININE 0.75 02/22/2014   HLD: Lab Results  Component Value Date   CHOL 243* 02/22/2014   HDL 65 02/22/2014   LDLCALC 147* 02/22/2014   TRIG 157* 02/22/2014   CHOLHDL 3.7 02/22/2014   Had discussed a statin, but has not started on that at this time.   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 Distal radius fracture, right 02/14/2012  . Hyperlipidemia 09/13/2011  . HYPERTENSION 01/15/2008  . 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 DYSPNEA 01/15/2008  . PALPITATIONS, HX OF 01/15/2008   Past Medical History  Diagnosis Date  . GERD (gastroesophageal reflux disease)   . Hepatitis     hep- a as a child  . Neuromuscular disorder     spasmodic dysphonia   . 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 Heart murmur     resolved- related to stress  of throat closing , echo  done 2009  . Hypertension     PCP manages htn, pt. also reports that she has seen Cardiology  at [facility] too.   Marland Kitchen Hyperlipidemia    Past Surgical History  Procedure Laterality Date  . Tonsillectomy      2009  . Open reduction internal fixation (orif) distal radial fracture  02/14/2012    Procedure: OPEN REDUCTION INTERNAL FIXATION (ORIF) DISTAL RADIAL FRACTURE;  Surgeon: Mcarthur Rossetti, MD;  Location: [facility];  Service: Orthopedics;  Laterality: Right;  Open Reduction Internal Fixation Right Wrist  . Fracture surgery     No Known Allergies Prior to Admission medications   Medication Sig Start Date End Date Taking? Authorizing Provider  hydrochlorothiazide (HYDRODIURIL) 25 MG tablet Take 1 tablet (25 mg total) by mouth daily. NO MORE REFILLS WITHOUT OFFICE VISIT - 2ND NOTICE 12/16/14  Yes Mancel Bale, PA-C  LORazepam (ATIVAN) 1 MG tablet Take 1 tablet (1 mg total) by mouth 2 (two) times daily as needed. For throat spasms 08/23/14  Yes [name], MD  metoprolol succinate (TOPROL-XL) 50 MG 24 hr tablet Take 1 tablet (50 mg total) by mouth daily. NO MORE REFILLS WITHOUT OFFICE VISIT - 2ND NOTICE 12/16/14  Yes Mancel Bale, PA-C  OVER THE COUNTER MEDICATION Biotears 2 caps daily for dry eyes  Yes Historical Provider, MD  zoster vaccine live, PF, (ZOSTAVAX) 17001 UNT/0.65ML injection Inject 19,400 Units into the skin once. Patient not taking: Reported on 10/12/2014 02/22/14   [name], MD   Social History   Social History  . Marital Status: Married    Spouse Name: N/A  . Number of Children: N/A  . Years of Education: N/A   Occupational History  . Not on file.   Social History Main Topics  . Smoking status: Never Smoker   . Smokeless tobacco: Never Used  . Alcohol Use: 3.6 oz/week    6 Glasses of wine per week     Comment: daily- one beer  . Drug Use: No  . Sexual Activity: Yes    Birth Control/ Protection: None   Other Topics  Concern  . Not on file   Social History Narrative   Industrial/product designer   Married   Exercises       Review of Systems  Constitutional: Negative for fatigue and unexpected weight change.  HENT: Positive for voice change.   Respiratory: Negative for chest tightness and shortness of breath.   Cardiovascular: Negative for chest pain, palpitations and leg swelling.  Gastrointestinal: Negative for abdominal pain and blood in stool.  Neurological: Negative for dizziness, syncope, light-headedness and headaches.       Objective:   Physical Exam  Constitutional: She is oriented to person, place, and time. She appears well-developed and well-nourished.  HENT:  Head: Normocephalic and atraumatic.  Eyes: Conjunctivae and EOM are normal. Pupils are equal, round, and reactive to light.  Neck: Carotid bruit is not present.  Cardiovascular: Normal rate, regular rhythm, normal heart sounds and intact distal pulses.   Pulmonary/Chest: Effort normal and breath sounds normal.  Abdominal: Soft. She exhibits no pulsatile midline mass. There is no tenderness.  Neurological: She is alert and oriented to person, place, and time.  Skin: Skin is warm and dry.  Psychiatric: She has a normal mood and affect. Her behavior is normal.  Vitals reviewed.   Filed Vitals:   12/29/14 1630  BP: 152/80  Pulse: 62  Temp: 98.2 F (36.8 C)  TempSrc: Oral  Resp: 16  Height: 5\' 6"  (1.676 m)  Weight: 166 lb (75.297 kg)  SpO2: 98%       Assessment & Plan:   Erin Mckee is a 69 y.o. female Dysphonia - Plan: LORazepam (ATIVAN) 1 MG tablet Vocal cord dysfunction - Plan: LORazepam (ATIVAN) 1 MG tablet  -Improved with episodic use of lorazepam, and status post speech therapy in [place]. No change in medications.  Essential hypertension - Plan: hydrochlorothiazide (HYDRODIURIL) 25 MG tablet, metoprolol succinate (TOPROL-XL) 50 MG 24 hr tablet  -Borderline, but we'll continue same doses for now. Check  blood pressures outside of office and if remaining over 140-150/90, consider change in regimen.  Hyperlipidemia - Plan: COMPLETE METABOLIC PANEL WITH GFR, Lipid panel  -Discuss statin based on 10 year ASCVD risk prior, but decided against it. She would like to see the current levels then decide on statin at that time. Labs pending  Plan a physical in the next 6-9 months.  Meds ordered this encounter  Medications  . hydrochlorothiazide (HYDRODIURIL) 25 MG tablet    Sig: Take 1 tablet (25 mg total) by mouth daily.    Dispense:  90 tablet    Refill:  2  . LORazepam (ATIVAN) 1 MG tablet    Sig: Take 1 tablet (1 mg total) by mouth 2 (two)  times daily as needed. For throat spasms    Dispense:  60 tablet    Refill:  1  . metoprolol succinate (TOPROL-XL) 50 MG 24 hr tablet    Sig: Take 1 tablet (50 mg total) by mouth daily.    Dispense:  90 tablet    Refill:  2   Patient Instructions  You should receive a call, email,  or letter about your lab results within the next week to 10 days.   No change in medications for now. Keep an eye on your blood pressures, and if remaining over 140/90, may need to change your medication doses.  Would still most likely recommend a statin medication, but we can wait to see what your cholesterol levels are on labs from today.  Plan on follow-up for a complete physical in the next 6-9 months. I refilled her medications for the next 9 months, call me sooner if refills needed.        By signing my name below, I, Judithe Modest, attest that this documentation has been prepared under the direction and in the presence of Merri Ray, MD. Electronically Signed: Judithe Modest, ER Scribe. 12/29/2014. 6:24 PM.   I personally performed the services described in this documentation, which was scribed in my presence. The recorded information has been reviewed and considered, and addended by me as needed.

## 2014-12-29 NOTE — Patient Instructions (Signed)
You should receive a call, email,  or letter about your lab results within the next week to 10 days.   No change in medications for now. Keep an eye on your blood pressures, and if remaining over 140/90, may need to change your medication doses.  Would still most likely recommend a statin medication, but we can wait to see what your cholesterol levels are on labs from today.  Plan on follow-up for a complete physical in the next 6-9 months. I refilled her medications for the next 9 months, call me sooner if refills needed.

## 2014-12-30 LAB — LIPID PANEL
CHOL/HDL RATIO: 4.4 ratio (ref <=5.0)
Cholesterol: 266 mg/dL — ABNORMAL HIGH (ref 125–200)
HDL: 61 mg/dL (ref >=46)
LDL CALC: 164 mg/dL — AB (ref <130)
Triglycerides: 203 mg/dL — ABNORMAL HIGH (ref <150)
VLDL: 41 mg/dL — ABNORMAL HIGH (ref <30)

## 2014-12-30 LAB — COMPLETE METABOLIC PANEL WITH GFR
ALT: 22 U/L (ref 6–29)
AST: 21 U/L (ref 10–35)
Albumin: 4.5 g/dL (ref 3.6–5.1)
Alkaline Phosphatase: 79 U/L (ref 33–130)
BUN: 12 mg/dL (ref 7–25)
CO2: 28 mmol/L (ref 20–31)
Calcium: 10 mg/dL (ref 8.6–10.4)
Chloride: 99 mmol/L (ref 98–110)
Creat: 0.77 mg/dL (ref 0.50–0.99)
GFR, Est African American: >89 mL/min (ref >=60)
GFR, Est Non African American: 79 mL/min (ref >=60)
Glucose, Bld: 91 mg/dL (ref 65–99)
Potassium: 4.3 mmol/L (ref 3.5–5.3)
Sodium: 138 mmol/L (ref 135–146)
Total Bilirubin: 0.7 mg/dL (ref 0.2–1.2)
Total Protein: 7.3 g/dL (ref 6.1–8.1)

## 2015-08-31 DIAGNOSIS — H2513 Age-related nuclear cataract, bilateral: Secondary | ICD-10-CM | POA: Diagnosis not present

## 2015-09-15 ENCOUNTER — Telehealth: Payer: Self-pay | Admitting: *Deleted

## 2015-09-15 ENCOUNTER — Encounter: Payer: Self-pay | Admitting: Family Medicine

## 2015-09-15 ENCOUNTER — Ambulatory Visit (INDEPENDENT_AMBULATORY_CARE_PROVIDER_SITE_OTHER): Payer: Medicare Other | Admitting: Family Medicine

## 2015-09-15 VITALS — BP 136/86 | HR 66 | Temp 98.4°F | Resp 16 | Ht 64.25 in | Wt 169.4 lb

## 2015-09-15 DIAGNOSIS — J383 Other diseases of vocal cords: Secondary | ICD-10-CM | POA: Diagnosis not present

## 2015-09-15 DIAGNOSIS — E785 Hyperlipidemia, unspecified: Secondary | ICD-10-CM

## 2015-09-15 DIAGNOSIS — R490 Dysphonia: Secondary | ICD-10-CM | POA: Diagnosis not present

## 2015-09-15 DIAGNOSIS — Z0000 Encounter for general adult medical examination without abnormal findings: Secondary | ICD-10-CM | POA: Diagnosis not present

## 2015-09-15 DIAGNOSIS — I10 Essential (primary) hypertension: Secondary | ICD-10-CM

## 2015-09-15 LAB — POCT URINALYSIS DIP (MANUAL ENTRY)
BILIRUBIN UA: NEGATIVE
Blood, UA: NEGATIVE
GLUCOSE UA: NEGATIVE
Nitrite, UA: NEGATIVE
PH UA: 5
Protein Ur, POC: NEGATIVE
Spec Grav, UA: 1.025
UROBILINOGEN UA: 0.2

## 2015-09-15 MED ORDER — METOPROLOL SUCCINATE ER 50 MG PO TB24: 50.0000 mg | ORAL_TABLET | Freq: Every day | ORAL | Status: DC

## 2015-09-15 MED ORDER — HYDROCHLOROTHIAZIDE 25 MG PO TABS: 25.0000 mg | ORAL_TABLET | Freq: Every day | ORAL | Status: DC

## 2015-09-15 MED ORDER — LORAZEPAM 1 MG PO TABS: 1.0000 mg | ORAL_TABLET | Freq: Two times a day (BID) | ORAL | Status: DC | PRN

## 2015-09-15 NOTE — Patient Instructions (Addendum)
IF you received an x-ray today, you will receive an invoice from [facility]. Please contact [facility] at [phone] with questions or concerns regarding your invoice.   IF you received labwork today, you will receive an invoice from [employer]. Please contact [facility] at [phone] with questions or concerns regarding your invoice.   Our billing staff will not be able to assist you with questions regarding bills from these companies.  You will be contacted with the lab results as soon as they are available. The fastest way to get your results is to activate your My Chart account. Instructions are located on the last page of this paperwork. If you have not heard from [place] regarding the results in 2 weeks, please contact this office.    We recommend that you schedule a mammogram for breast cancer screening. Typically, you do not need a referral to do this. Please contact a local imaging center to schedule your mammogram.  [facility] - [phone]  *ask for the Radiology Department [facility]) - [phone] or [phone]  [facility] - [phone] [place] [phone] [facility] - [phone]  *ask for the [facility] - [phone]  *ask for the Radiology Department [facility] - [phone]  *ask for the Mammography Department [facility] - [phone]  Schedule mammogram and bone density in August. Let me know if referral needed.   Follow up with dermatology for skin check, including areas in back.   No change in meds for now.   Follow up in 6-9 months. Enjoy your summer.   Keeping You Healthy  Get These Tests  Blood Pressure- Have your blood pressure checked by your healthcare provider at least once a year.  Normal blood pressure is 120/80.  Weight- Have your body mass index  (BMI) calculated to screen for obesity.  BMI is a measure of body fat based on height and weight.  You can calculate your own BMI at [employer].it  Cholesterol- Have your cholesterol checked every year.  Diabetes- Have your blood sugar checked every year if you have high blood pressure, high cholesterol, a family history of diabetes or if you are overweight.  Pap Test - Have a pap test every 1 to 5 years if you have been sexually active.  If you are older than 65 and recent pap tests have been normal you may not need additional pap tests.  In addition, if you have had a hysterectomy  for benign disease additional pap tests are not necessary.  Mammogram-Yearly mammograms are essential for early detection of breast cancer  Screening for Colon Cancer- Colonoscopy starting at age 38. Screening may begin sooner depending on your family history and other health conditions.  Follow up colonoscopy as directed by your Gastroenterologist.  Screening for Osteoporosis- Screening begins at age 18 with bone density scanning, sooner if you are at higher risk for developing Osteoporosis.  Get these medicines  Calcium with Vitamin D- Your body requires 1200-1500 mg of Calcium a day and 250 005 0697 IU of Vitamin D a day.  You can only absorb 500 mg of Calcium at a time therefore Calcium must be taken in 2 or 3 separate doses throughout the day.  Hormones- Hormone therapy has been associated with increased risk for certain cancers and heart disease.  Talk to your healthcare provider about if you need relief from menopausal symptoms.  Aspirin- Ask your healthcare provider about taking Aspirin to prevent Heart Disease and Stroke.  Get these Immuniztions  Flu shot- Every fall  Pneumonia shot- Once after the age of 32; if you are younger ask your healthcare provider if you need a pneumonia shot.  Tetanus- Every ten years.  Zostavax- Once after the age of 76 to prevent shingles.  Take these  steps  Don't smoke- Your healthcare provider can help you quit. For tips on how to quit, ask your healthcare provider or go to www.smokefree.gov or call 1-800 QUIT-NOW.  Be physically active- Exercise 5 days a week for a minimum of 30 minutes.  If you are not already physically active, start slow and gradually work up to 30 minutes of moderate physical activity.  Try walking, dancing, bike riding, swimming, etc.  Eat a healthy diet- Eat a variety of healthy foods such as fruits, vegetables, whole grains, low fat milk, low fat cheeses, yogurt, lean meats, chicken, fish, eggs, dried beans, tofu, etc.  For more information go to www.thenutritionsource.org  Dental visit- Brush and floss teeth twice daily; visit your dentist twice a year.  Eye exam- Visit your Optometrist or Ophthalmologist yearly.  Drink alcohol in moderation- Limit alcohol intake to one drink or less a day.  Never drink and drive.  Depression- Your emotional health is as important as your physical health.  If you're feeling down or losing interest in things you normally enjoy, please talk to your healthcare provider.  Seat Belts- can save your life; always wear one  Smoke/Carbon Monoxide detectors- These detectors need to be installed on the appropriate level of your home.  Replace batteries at least once a year.  Violence- If anyone is threatening or hurting you, please tell your healthcare provider.  Living Will/ Health care power of attorney- Discuss with your healthcare provider and family.

## 2015-09-15 NOTE — Progress Notes (Signed)
By signing my name below, I, Mesha Guinyard, attest that this documentation has been prepared under the direction and in the presence of Merri Ray, MD.  Electronically Signed: Verlee Monte, Medical Scribe. 09/15/2015. 3:08 PM.  Subjective:    Patient ID: Erin Mckee, female    DOB: Feb 11, 1946, 70 y.o.   MRN: [id number]  HPI Chief Complaint  Patient presents with  . Annual Exam    pap smear  . Medication Refill    metoprolol succinate, lorazepam and hctz    HPI Comments: Erin Mckee is a 70 y.o. female who presents to the [facility] for an annual physical exam. Pt has had light sensitivity and urinary urgency for years.  HTN: Borderline at Sept 2016 152/80- continue same medication with home monitoring. Pt denies any side affects from her bp medications.  HLD: Lab Results  Component Value Date   CHOL 266* 12/29/2014   HDL 61 12/29/2014   LDLCALC 164* 12/29/2014   TRIG 203* 12/29/2014   CHOLHDL 4.4 12/29/2014   Statins has been discussed based on elevated ASCVD risk. Pt would like to try to control it with lifestyle changes before getting medication for it.  CA Screening: Breast CA: Mammogram in 2015; no evidence of malignancy. Pt has another mammogram due in August- has not scheduled. Pt hasn't noticed any new lumps or bumps on her breast. Cervical CA: Pt's last pap smear was a couple of years ago with nl results. Pt isn't worried about cervical CA. Pt hasn't noticed any new lumps or bumps around her vagina. Pt denies post menopausal bleeding. Colon CA: Colonoscopy July 2016- 2 pylops removed. Repeat in 5 years, as 1 pylop tubular adenoma. Dermatologist: Pt does see a dermatologist but hasn't been in a while. Pt would not like a referral. Pt states  but thinks the mole on her back Pt thinks the moles Dad had basal on his nose.  Bone Density: Pt has never had test. Mom had osteoporosis, and had shrinking with 5' 2'' to 4' 11''  Hep C/HIV Testing:  Pt would like to defer HIV and Hep C testing.    Depression Screening: Depression screen [facility] 2/9 09/15/2015 12/29/2014 02/22/2014  Decreased Interest 0 0 0  Down, Depressed, Hopeless 0 0 0  PHQ - 2 Score 0 0 0   Fall Screening: No falls in the past year.  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 Pt states she no longer has to use the Ativan as much.  Immunizations: Immunization History  Administered Date(s) Administered  . Hepatitis B 04/14/1997  . Influenza Split 01/17/2012, 12/15/2013  . Pneumococcal Conjugate-13 02/22/2014  . Pneumococcal Polysaccharide-23 04/14/2012  . Tdap 04/02/2008  . Zoster 02/01/2015   Is UTD on her shingles and tetanus vaccine.  Vision:  Visual Acuity Screening   Right eye Left eye Both eyes  Without correction:     With correction: 20/30 20/40 20/30    Pt states she's had photophobia for years and states it's due to the distonia. Pt uses Ativan to relieve her of photophobia. Pt last saw her ophthalmologist 2 weeks ago- no abnormalities were found.  Exercise: Pt chases around her horses and now that she has a new puppy she chases it around.  Dentist: Pt has a Pharmacist, community and last saw them 6 months ago.  Advance Directives: Pt talked to her husband about it but isn't sure if it's written.  Functional  Status: Reviewed per questionnaire. No concerning findings on discussion.  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  Patient Active Problem List   Diagnosis Date Noted  . Distal radius fracture, right 02/14/2012  . Hyperlipidemia 09/13/2011  . HYPERTENSION 01/15/2008  . 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 DYSPNEA 01/15/2008  . PALPITATIONS, HX OF 01/15/2008   Past Medical History  Diagnosis Date  . GERD (gastroesophageal reflux disease)   . Hepatitis     hep- a as a child  . Neuromuscular disorder (HCC)     spasmodic dysphonia   .  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 Heart murmur     resolved- related to stress of throat closing , echo  done 2009  . Hypertension     PCP manages htn, pt. also reports that she has seen Cardiology  at [facility] too.   Marland Kitchen Hyperlipidemia    Past Surgical History  Procedure Laterality Date  . Tonsillectomy      2009  . Open reduction internal fixation (orif) distal radial fracture  02/14/2012    Procedure: OPEN REDUCTION INTERNAL FIXATION (ORIF) DISTAL RADIAL FRACTURE;  Surgeon: Mcarthur Rossetti, MD;  Location: [facility];  Service: Orthopedics;  Laterality: Right;  Open Reduction Internal Fixation Right Wrist  . Fracture surgery     No Known Allergies Prior to Admission medications   Medication Sig Start Date End Date Taking? Authorizing Provider  hydrochlorothiazide (HYDRODIURIL) 25 MG tablet Take 1 tablet (25 mg total) by mouth daily. 12/29/14   [name], MD  LORazepam (ATIVAN) 1 MG tablet Take 1 tablet (1 mg total) by mouth 2 (two) times daily as needed. For throat spasms 12/29/14   [name], MD  metoprolol succinate (TOPROL-XL) 50 MG 24 hr tablet Take 1 tablet (50 mg total) by mouth daily. 12/29/14   [name], MD  OVER THE COUNTER MEDICATION Biotears 2 caps daily for dry eyes    Historical Provider, MD  zoster vaccine live, PF, (ZOSTAVAX) 16109 UNT/0.65ML injection Inject 19,400 Units into the skin once. Patient not taking: Reported on 10/12/2014 02/22/14   [name], MD   Social History   Social History  . Marital Status: Married    Spouse Name: N/A  . Number of Children: N/A  . Years of Education: N/A   Occupational History  . pharmacist    Social History Main Topics  . Smoking status: Never Smoker   . Smokeless tobacco: Never Used  . Alcohol Use: 3.6 oz/week    6 Glasses of wine per week     Comment: daily- one beer  . Drug Use: No  . Sexual Activity: Yes    Birth Control/ Protection: None   Other Topics Concern  .  Not on file   Social History Narrative   Industrial/product designer   Married   Exercises       Review of Systems  Eyes: Positive for photophobia.  Genitourinary: Positive for urgency.  Positive for light sensitivity and urinary urgency but other wise 13 point ROS  Objective:  BP 136/86 mmHg  Pulse 66  Temp(Src) 98.4 F (36.9 C) (Oral)  Resp 16  Ht 5' 4.25" (1.632 m)  Wt 169 lb 6.4 oz (76.839 kg)  BMI 28.85 kg/m2  SpO2 97%  Physical Exam  Constitutional: She is oriented to person, place, and time. She appears well-developed and well-nourished.  HENT:  Head: Normocephalic and  atraumatic.  Right Ear: External ear normal.  Left Ear: External ear normal.  Mouth/Throat: Oropharynx is clear and moist.  Eyes: Conjunctivae are normal. Pupils are equal, round, and reactive to light.  Neck: Normal range of motion. Neck supple. No thyromegaly present.  Cardiovascular: Normal rate, regular rhythm, normal heart sounds and intact distal pulses.   No murmur heard. Pulmonary/Chest: Effort normal and breath sounds normal. No respiratory distress. She has no wheezes.  No lumps or bumps found in breast  Abdominal: Soft. Bowel sounds are normal. There is no tenderness.  Musculoskeletal: Normal range of motion. She exhibits no edema or tenderness.  Lymphadenopathy:    She has no cervical adenopathy.  Neurological: She is alert and oriented to person, place, and time.  Skin: Skin is warm and dry. No rash noted.  Multiple scattered hyperpigmented lesions on back- some with darkened centers, some flush colored.  Psychiatric: She has a normal mood and affect. Her behavior is normal. Thought content normal.  Vitals reviewed.  Assessment & Plan:   Erin Mckee is a 70 y.o. female Medicare annual wellness visit, subsequent  --anticipatory guidance as below in AVS, screening labs above. Health maintenance items as above in HPI discussed/recommended as applicable.   Essential hypertension - Plan:  POCT urinalysis dipstick, hydrochlorothiazide (HYDRODIURIL) 25 MG tablet, metoprolol succinate (TOPROL-XL) 50 MG 24 hr tablet  -Stable. No med changes for now. Labs pending.  Dysphonia - Plan: LORazepam (ATIVAN) 1 MG tablet Vocal cord dysfunction - Plan: LORazepam (ATIVAN) 1 MG tablet  -Stable. Has Ativan as needed for episodic symptoms.  Hyperlipidemia - Plan: COMPLETE METABOLIC PANEL WITH GFR, Lipid panel  -Statin discussed previously. Will recheck lipid panel, then can evaluate 10 year ASCVD risk, and overall cholesterol ratio to determine statin need or other recommendations.  Meds ordered this encounter  Medications  . hydrochlorothiazide (HYDRODIURIL) 25 MG tablet    Sig: Take 1 tablet (25 mg total) by mouth daily.    Dispense:  90 tablet    Refill:  2  . metoprolol succinate (TOPROL-XL) 50 MG 24 hr tablet    Sig: Take 1 tablet (50 mg total) by mouth daily.    Dispense:  90 tablet    Refill:  2  . LORazepam (ATIVAN) 1 MG tablet    Sig: Take 1 tablet (1 mg total) by mouth 2 (two) times daily as needed. For throat spasms    Dispense:  60 tablet    Refill:  1   Patient Instructions       IF you received an x-ray today, you will receive an invoice from [facility]. Please contact [facility] at [phone] with questions or concerns regarding your invoice.   IF you received labwork today, you will receive an invoice from [employer]. Please contact [facility] at [phone] with questions or concerns regarding your invoice.   Our billing staff will not be able to assist you with questions regarding bills from these companies.  You will be contacted with the lab results as soon as they are available. The fastest way to get your results is to activate your My Chart account. Instructions are located on the last page of this paperwork. If you have not heard from [place] regarding the results in 2 weeks, please contact this office.    We  recommend that you schedule a mammogram for breast cancer screening. Typically, you do not need a referral to do this. Please contact a local imaging center to schedule your mammogram.  [facility] - [phone]  *ask for the Radiology Department [facility] - [phone] or [phone]  [facility] - [phone] [place] [phone] [facility] - [phone]  *ask for the [facility] - [phone]  *ask for the Radiology Department [facility] - [phone]  *ask for the Mammography Department [facility] - [phone]  Schedule mammogram and bone density in August. Let me know if referral needed.   Follow up with dermatology for skin check, including areas in back.   No change in meds for now.   Follow up in 6-9 months. Enjoy your summer.   Keeping You Healthy  Get These Tests  Blood Pressure- Have your blood pressure checked by your healthcare provider at least once a year.  Normal blood pressure is 120/80.  Weight- Have your body mass index (BMI) calculated to screen for obesity.  BMI is a measure of body fat based on height and weight.  You can calculate your own BMI at GravelBags.it  Cholesterol- Have your cholesterol checked every year.  Diabetes- Have your blood sugar checked every year if you have high blood pressure, high cholesterol, a family history of diabetes or if you are overweight.  Pap Test - Have a pap test every 1 to 5 years if you have been sexually active.  If you are older than 65 and recent pap tests have been normal you may not need additional pap tests.  In addition, if you have had a hysterectomy  for benign disease additional pap tests are not necessary.  Mammogram-Yearly mammograms are essential for early detection of breast cancer  Screening for Colon Cancer- Colonoscopy starting at age 32.  Screening may begin sooner depending on your family history and other health conditions.  Follow up colonoscopy as directed by your Gastroenterologist.  Screening for Osteoporosis- Screening begins at age 2 with bone density scanning, sooner if you are at higher risk for developing Osteoporosis.  Get these medicines  Calcium with Vitamin D- Your body requires 1200-1500 mg of Calcium a day and [phone] IU of Vitamin D a day.  You can only absorb 500 mg of Calcium at a time therefore Calcium must be taken in 2 or 3 separate doses throughout the day.  Hormones- Hormone therapy has been associated with increased risk for certain cancers and heart disease.  Talk to your healthcare provider about if you need relief from menopausal symptoms.  Aspirin- Ask your healthcare provider about taking Aspirin to prevent Heart Disease and Stroke.  Get these Immuniztions  Flu shot- Every fall  Pneumonia shot- Once after the age of 73; if you are younger ask your healthcare provider if you need a pneumonia shot.  Tetanus- Every ten years.  Zostavax- Once after the age of 8 to prevent shingles.  Take these steps  Don't smoke- Your healthcare provider can help you quit. For tips on how to quit, ask your healthcare provider or go to www.smokefree.gov or call 1-800 QUIT-NOW.  Be physically active- Exercise 5 days a week for a minimum of 30 minutes.  If you are not already physically active, start slow and gradually work up to 30 minutes of moderate physical activity.  Try walking, dancing, bike riding, swimming, etc.  Eat a healthy diet- Eat a variety of healthy foods such as fruits, vegetables, whole grains, low fat milk, low fat cheeses, yogurt, lean meats, chicken, fish, eggs, dried  beans, tofu, etc.  For more information go to www.thenutritionsource.org  Dental visit- Brush and floss teeth twice daily; visit your dentist twice a year.  Eye exam- Visit your Optometrist or Ophthalmologist  yearly.  Drink alcohol in moderation- Limit alcohol intake to one drink or less a day.  Never drink and drive.  Depression- Your emotional health is as important as your physical health.  If you're feeling down or losing interest in things you normally enjoy, please talk to your healthcare provider.  Seat Belts- can save your life; always wear one  Smoke/Carbon Monoxide detectors- These detectors need to be installed on the appropriate level of your home.  Replace batteries at least once a year.  Violence- If anyone is threatening or hurting you, please tell your healthcare provider.  Living Will/ Health care power of attorney- Discuss with your healthcare provider and family.    I personally performed the services described in this documentation, which was scribed in my presence. The recorded information has been reviewed and considered, and addended by me as needed.   Signed,   Merri Ray, MD [facility].  09/17/2015 9:43 AM

## 2015-09-15 NOTE — Telephone Encounter (Signed)
Faxed prescription lorazepam 1 mg to [facility] ([address]), per Dr Carlota Raspberry.

## 2015-09-16 LAB — COMPLETE METABOLIC PANEL WITH GFR
ALT: 34 U/L — AB (ref 6–29)
AST: 30 U/L (ref 10–35)
Albumin: 4.5 g/dL (ref 3.6–5.1)
Alkaline Phosphatase: 70 U/L (ref 33–130)
BUN: 12 mg/dL (ref 7–25)
CHLORIDE: 103 mmol/L (ref 98–110)
CO2: 25 mmol/L (ref 20–31)
Calcium: 9.7 mg/dL (ref 8.6–10.4)
Creat: 0.76 mg/dL (ref 0.60–0.93)
GFR, Est African American: >89 mL/min (ref >=60)
GFR, Est Non African American: 80 mL/min (ref >=60)
GLUCOSE: 81 mg/dL (ref 65–99)
POTASSIUM: 3.6 mmol/L (ref 3.5–5.3)
SODIUM: 141 mmol/L (ref 135–146)
Total Bilirubin: 0.6 mg/dL (ref 0.2–1.2)
Total Protein: 7.1 g/dL (ref 6.1–8.1)

## 2015-09-16 LAB — LIPID PANEL
CHOL/HDL RATIO: 3.6 ratio (ref <=5.0)
Cholesterol: 249 mg/dL — ABNORMAL HIGH (ref 125–200)
HDL: 70 mg/dL (ref >=46)
LDL CALC: 150 mg/dL — AB (ref <130)
Triglycerides: 146 mg/dL (ref <150)
VLDL: 29 mg/dL (ref <30)

## 2016-06-20 ENCOUNTER — Other Ambulatory Visit: Payer: Self-pay | Admitting: Family Medicine

## 2016-06-20 DIAGNOSIS — I10 Essential (primary) hypertension: Secondary | ICD-10-CM

## 2016-06-20 NOTE — Telephone Encounter (Signed)
BP Readings from Last 3 Encounters:  09/15/15 136/86  12/29/14 (!) 152/80  10/26/14 (!) 110/58    BP controlled at annual exam on 09/15/2015, due for f/u in 3 months.

## 2016-09-24 ENCOUNTER — Other Ambulatory Visit: Payer: Self-pay | Admitting: Urgent Care

## 2016-09-24 DIAGNOSIS — I10 Essential (primary) hypertension: Secondary | ICD-10-CM

## 2016-09-30 ENCOUNTER — Other Ambulatory Visit: Payer: Self-pay | Admitting: Urgent Care

## 2016-09-30 DIAGNOSIS — I10 Essential (primary) hypertension: Secondary | ICD-10-CM

## 2016-10-05 ENCOUNTER — Encounter: Payer: Self-pay | Admitting: Family Medicine

## 2016-10-05 ENCOUNTER — Ambulatory Visit (INDEPENDENT_AMBULATORY_CARE_PROVIDER_SITE_OTHER): Payer: Medicare Other | Admitting: Family Medicine

## 2016-10-05 VITALS — BP 150/88 | HR 69 | Temp 98.0°F | Resp 18 | Ht 63.78 in | Wt 174.0 lb

## 2016-10-05 DIAGNOSIS — I10 Essential (primary) hypertension: Secondary | ICD-10-CM | POA: Diagnosis not present

## 2016-10-05 DIAGNOSIS — Z6831 Body mass index (BMI) 31.0-31.9, adult: Secondary | ICD-10-CM | POA: Diagnosis not present

## 2016-10-05 DIAGNOSIS — E7800 Pure hypercholesterolemia, unspecified: Secondary | ICD-10-CM | POA: Diagnosis not present

## 2016-10-05 DIAGNOSIS — E6609 Other obesity due to excess calories: Secondary | ICD-10-CM

## 2016-10-05 DIAGNOSIS — J383 Other diseases of vocal cords: Secondary | ICD-10-CM | POA: Diagnosis not present

## 2016-10-05 DIAGNOSIS — R490 Dysphonia: Secondary | ICD-10-CM

## 2016-10-05 DIAGNOSIS — Z1159 Encounter for screening for other viral diseases: Secondary | ICD-10-CM | POA: Diagnosis not present

## 2016-10-05 LAB — POCT URINALYSIS DIP (MANUAL ENTRY)
BILIRUBIN UA: NEGATIVE
Blood, UA: NEGATIVE
GLUCOSE UA: NEGATIVE mg/dL
Ketones, POC UA: NEGATIVE mg/dL
LEUKOCYTES UA: NEGATIVE
NITRITE UA: NEGATIVE
Protein Ur, POC: NEGATIVE mg/dL
Spec Grav, UA: 1.015 (ref 1.010–1.025)
Urobilinogen, UA: 0.2 E.U./dL
pH, UA: 6.5 (ref 5.0–8.0)

## 2016-10-05 MED ORDER — HYDROCHLOROTHIAZIDE 25 MG PO TABS: 25.0000 mg | ORAL_TABLET | Freq: Every day | ORAL | 3 refills | Status: DC

## 2016-10-05 MED ORDER — METOPROLOL SUCCINATE ER 50 MG PO TB24: 50.0000 mg | ORAL_TABLET | Freq: Every day | ORAL | 3 refills | Status: DC

## 2016-10-05 MED ORDER — LORAZEPAM 1 MG PO TABS: 1.0000 mg | ORAL_TABLET | Freq: Two times a day (BID) | ORAL | 1 refills | Status: DC | PRN

## 2016-10-05 NOTE — Progress Notes (Signed)
Subjective:    Patient ID: Erin Mckee, female    DOB: 1945/04/14, 71 y.o.   MRN: [id number]  10/05/2016  Hypertension (need refill)   HPI This 71 y.o. female presents for evaluation of hypertension, hypercholesterolemia, anxiety.  Last visit with Dr. Carlota Raspberry on year ago.  Was to return for evaluation in nine months.  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  Not checking BP much at home. Not exercising much at this time.   BP Readings from Last 3 Encounters:  10/05/16 (!) 150/88  09/15/15 136/86  12/29/14 (!) 152/80   Wt Readings from Last 3 Encounters:  10/05/16 174 lb (78.9 kg)  09/15/15 169 lb 6.4 oz (76.8 kg)  12/29/14 166 lb (75.3 kg)   Immunization History  Administered Date(s) Administered  . Hepatitis B 04/14/1997  . Influenza Split 01/17/2012, 12/15/2013  . Pneumococcal Conjugate-13 02/22/2014  . Pneumococcal Polysaccharide-23 04/14/2012  . Tdap 04/02/2008  . Zoster 02/01/2015    Review of Systems  Constitutional: Negative for chills, diaphoresis, fatigue and fever.  Eyes: Negative for visual disturbance.  Respiratory: Negative for cough and shortness of breath.   Cardiovascular: Negative for chest pain, palpitations and leg swelling.  Gastrointestinal: Negative for abdominal pain, constipation, diarrhea, nausea and vomiting.  Endocrine: Negative for cold intolerance, heat intolerance, polydipsia, polyphagia and polyuria.  Neurological: Negative for dizziness, tremors, seizures, syncope, facial asymmetry, speech difficulty, weakness, light-headedness, numbness and headaches.    Past Medical History:  Diagnosis Date  . GERD (gastroesophageal reflux disease)   . Heart murmur    resolved- related to stress of throat closing , echo  done 2009  . Hepatitis    hep- a as a child  . Hyperlipidemia   . Hypertension    PCP manages htn, pt. also reports that she has seen Cardiology  at  [facility] too.   Marland Kitchen Neuromuscular disorder (HCC)    spasmodic dysphonia   . 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 FRACTURE SURGERY    . OPEN REDUCTION INTERNAL FIXATION (ORIF) DISTAL RADIAL FRACTURE  02/14/2012   Procedure: OPEN REDUCTION INTERNAL FIXATION (ORIF) DISTAL RADIAL FRACTURE;  Surgeon: Mcarthur Rossetti, MD;  Location: [place];  Service: Orthopedics;  Laterality: Right;  Open Reduction Internal Fixation Right Wrist  . TONSILLECTOMY     2009   No Known Allergies  Social History   Social History  . Marital status: Married    Spouse name: N/A  . Number of children: N/A  . Years of education: N/A   Occupational History  . pharmacist     retired 2016; now PRN  .  [place]   Social History Main Topics  . Smoking status: Never Smoker  . Smokeless tobacco: Never Used  . Alcohol use 3.6 oz/week    6 Glasses of wine per week     Comment: daily- one beer  . Drug use: No  . Sexual activity: Yes    Birth control/ protection: Post-menopausal   Other Topics Concern  . Not on file   Social History Narrative   Industrial/product designer   Married   Exercises      Family History  Problem Relation Age of Onset  . Hypertension Mother   . Heart disease Mother 61       CAD/CABG  . Hyperlipidemia Mother   .  Hypertension Father   . Heart disease Father        AMI  . Hyperlipidemia Father   . Multiple myeloma Father   . Hypertension Sister   . Thyroid disease Sister   . Hypertension Brother   . Crohn's disease Brother   . Cerebral palsy Son   . Hydrocephalus Son   . Colon cancer Neg Hx        Objective:    BP (!) 150/88   Pulse 69   Temp 98 F (36.7 C) (Oral)   Resp 18   Ht 5' 3.78" (1.62 m)   Wt 174 lb (78.9 kg)   SpO2 98%   BMI 30.07 kg/m  Physical Exam  Constitutional: She is oriented to person, place, and time. She appears well-developed and well-nourished. No distress.  HENT:    Head: Normocephalic and atraumatic.  Right Ear: External ear normal.  Left Ear: External ear normal.  Nose: Nose normal.  Mouth/Throat: Oropharynx is clear and moist.  Eyes: Conjunctivae and EOM are normal. Pupils are equal, round, and reactive to light.  Neck: Normal range of motion. Neck supple. Carotid bruit is not present. No thyromegaly present.  Cardiovascular: Normal rate, regular rhythm, normal heart sounds and intact distal pulses.  Exam reveals no gallop and no friction rub.   No murmur heard. Pulmonary/Chest: Effort normal and breath sounds normal. She has no wheezes. She has no rales.  Abdominal: Soft. Bowel sounds are normal. She exhibits no distension and no mass. There is no tenderness. There is no rebound and no guarding.  Lymphadenopathy:    She has no cervical adenopathy.  Neurological: She is alert and oriented to person, place, and time. No cranial nerve deficit.  Skin: Skin is warm and dry. No rash noted. She is not diaphoretic. No erythema. No pallor.  Psychiatric: She has a normal mood and affect. Her behavior is normal.   Results for orders placed or performed in visit on 10/05/16  CBC with Differential/Platelet  Result Value Ref Range   WBC 6.7 3.4 - 10.8 x10E3/uL   RBC 4.66 3.77 - 5.28 x10E6/uL   Hemoglobin 14.3 11.1 - 15.9 g/dL   Hematocrit 41.8 34.0 - 46.6 %   MCV 90 79 - 97 fL   MCH 30.7 26.6 - 33.0 pg   MCHC 34.2 31.5 - 35.7 g/dL   RDW 13.2 12.3 - 15.4 %   Platelets 238 150 - 379 x10E3/uL   Neutrophils 59 Not Estab. %   Lymphs 34 Not Estab. %   Monocytes 6 Not Estab. %   Eos 1 Not Estab. %   Basos 0 Not Estab. %   Neutrophils Absolute 3.9 1.4 - 7.0 x10E3/uL   Lymphocytes Absolute 2.3 0.7 - 3.1 x10E3/uL   Monocytes Absolute 0.4 0.1 - 0.9 x10E3/uL   EOS (ABSOLUTE) 0.1 0.0 - 0.4 x10E3/uL   Basophils Absolute 0.0 0.0 - 0.2 x10E3/uL   Immature Granulocytes 0 Not Estab. %   Immature Grans (Abs) 0.0 0.0 - 0.1 x10E3/uL  Comprehensive metabolic panel   Result Value Ref Range   Glucose 94 65 - 99 mg/dL   BUN 9 8 - 27 mg/dL   Creatinine, Ser 0.75 0.57 - 1.00 mg/dL   GFR calc non Af Amer 80 >59 mL/min/1.73   GFR calc Af Amer 93 >59 mL/min/1.73   BUN/Creatinine Ratio 12 12 - 28   Sodium 134 134 - 144 mmol/L   Potassium 3.4 (L) 3.5 - 5.2 mmol/L   Chloride 96  96 - 106 mmol/L   CO2 23 20 - 29 mmol/L   Calcium 9.6 8.7 - 10.3 mg/dL   Total Protein 6.9 6.0 - 8.5 g/dL   Albumin 4.4 3.5 - 4.8 g/dL   Globulin, Total 2.5 1.5 - 4.5 g/dL   Albumin/Globulin Ratio 1.8 1.2 - 2.2   Bilirubin Total 0.6 0.0 - 1.2 mg/dL   Alkaline Phosphatase 89 39 - 117 IU/L   AST 21 0 - 40 IU/L   ALT 24 0 - 32 IU/L  Hepatitis C antibody  Result Value Ref Range   Hep C Virus Ab <0.1 0.0 - 0.9 s/co ratio  POCT urinalysis dipstick  Result Value Ref Range   Color, UA yellow yellow   Clarity, UA clear clear   Glucose, UA negative negative mg/dL   Bilirubin, UA negative negative   Ketones, POC UA negative negative mg/dL   Spec Grav, UA 1.015 1.010 - 1.025   Blood, UA negative negative   pH, UA 6.5 5.0 - 8.0   Protein Ur, POC negative negative mg/dL   Urobilinogen, UA 0.2 0.2 or 1.0 E.U./dL   Nitrite, UA Negative Negative   Leukocytes, UA Negative Negative   Depression screen [facility] 2/9 10/05/2016 09/15/2015 12/29/2014 02/22/2014  Decreased Interest 0 0 0 0  Down, Depressed, Hopeless 0 0 0 0  PHQ - 2 Score 0 0 0 0   Fall Risk  10/05/2016 09/15/2015 02/22/2014  Falls in the past year? No No No       Assessment & Plan:   1. Essential hypertension   2. Pure hypercholesterolemia   3. Dysphonia   4. Vocal cord dysfunction   5. Need for hepatitis C screening test   6. Class 1 obesity due to excess calories with serious comorbidity and body mass index (BMI) of 31.0 to 31.9 in adult    -moderately controlled; obtain labs; refill provided for HCTZ. -recommend low cholesterol foods, weight loss,and exercise.  Not fasting today; recommend returning in upcoming 3-6  months for CPE with Dr. Carlota Raspberry. -rx for Lorazepam provided. -obtain Hepatitis C screening; also discussed Shingrix vaccine.  Orders Placed This Encounter  Procedures  . CBC with Differential/Platelet  . Comprehensive metabolic panel  . Hepatitis C antibody  . POCT urinalysis dipstick   Meds ordered this encounter  Medications  . hydrochlorothiazide (HYDRODIURIL) 25 MG tablet    Sig: Take 1 tablet (25 mg total) by mouth daily.    Dispense:  90 tablet    Refill:  3  . metoprolol succinate (TOPROL-XL) 50 MG 24 hr tablet    Sig: Take 1 tablet (50 mg total) by mouth daily.    Dispense:  90 tablet    Refill:  3  . LORazepam (ATIVAN) 1 MG tablet    Sig: Take 1 tablet (1 mg total) by mouth 2 (two) times daily as needed. For throat spasms    Dispense:  60 tablet    Refill:  1    Return in about 6 months (around 04/07/2017) for complete physical examiniation.   Dandra Velardi Elayne Guerin, M.D. Primary Care at [facility] previously [facility] [address] [place], [place]  [id number] [phone] phone [phone] fax

## 2016-10-05 NOTE — Patient Instructions (Addendum)
   IF you received an x-ray today, you will receive an invoice from [facility]. Please contact [facility] at [phone] with questions or concerns regarding your invoice.   IF you received labwork today, you will receive an invoice from [facility]. Please contact [facility] at [phone] with questions or concerns regarding your invoice.   Our billing staff will not be able to assist you with questions regarding bills from these companies.  You will be contacted with the lab results as soon as they are available. The fastest way to get your results is to activate your My Chart account. Instructions are located on the last page of this paperwork. If you have not heard from us regarding the results in 2 weeks, please contact this office.      Fat and Cholesterol Restricted Diet Getting too much fat and cholesterol in your diet may cause health problems. Following this diet helps keep your fat and cholesterol at normal levels. This can keep you from getting sick. What types of fat should I choose?  Choose monosaturated and polyunsaturated fats. These are found in foods such as olive oil, canola oil, flaxseeds, walnuts, almonds, and seeds.  Eat more omega-3 fats. Good choices include salmon, mackerel, sardines, tuna, flaxseed oil, and ground flaxseeds.  Limit saturated fats. These are in animal products such as meats, butter, and cream. They can also be in plant products such as palm oil, palm kernel oil, and coconut oil.  Avoid foods with partially hydrogenated oils in them. These contain trans fats. Examples of foods that have trans fats are stick margarine, some tub margarines, cookies, crackers, and other baked goods. What general guidelines do I need to follow?  Check food labels. Look for the words "trans fat" and "saturated fat."  When preparing a meal: ? Fill half of your plate with vegetables and green salads. ? Fill one fourth of your plate with whole  grains. Look for the word "whole" as the first word in the ingredient list. ? Fill one fourth of your plate with lean protein foods.  Eat more foods that have fiber, like apples, carrots, beans, peas, and barley.  Eat more home-cooked foods. Eat less at restaurants and buffets.  Limit or avoid alcohol.  Limit foods high in starch and sugar.  Limit fried foods.  Cook foods without frying them. Baking, boiling, grilling, and broiling are all great options.  Lose weight if you are overweight. Losing even a small amount of weight can help your overall health. It can also help prevent diseases such as diabetes and heart disease. What foods can I eat? Grains Whole grains, such as whole wheat or whole grain breads, crackers, cereals, and pasta. Unsweetened oatmeal, bulgur, barley, quinoa, or brown rice. Corn or whole wheat flour tortillas. Vegetables Fresh or frozen vegetables (raw, steamed, roasted, or grilled). Green salads. Fruits All fresh, canned (in natural juice), or frozen fruits. Meat and Other Protein Products Ground beef (85% or leaner), grass-fed beef, or beef trimmed of fat. Skinless chicken or turkey. Ground chicken or turkey. Pork trimmed of fat. All fish and seafood. Eggs. Dried beans, peas, or lentils. Unsalted nuts or seeds. Unsalted canned or dry beans. Dairy Low-fat dairy products, such as skim or 1% milk, 2% or reduced-fat cheeses, low-fat ricotta or cottage cheese, or plain low-fat yogurt. Fats and Oils Tub margarines without trans fats. Light or reduced-fat mayonnaise and salad dressings. Avocado. Olive, canola, sesame, or safflower oils. Natural peanut or almond butter (choose ones without   added sugar and oil). The items listed above may not be a complete list of recommended foods or beverages. Contact your dietitian for more options. What foods are not recommended? Grains White bread. White pasta. White rice. Cornbread. Bagels, pastries, and croissants. Crackers that  contain trans fat. Vegetables White potatoes. Corn. Creamed or fried vegetables. Vegetables in a cheese sauce. Fruits Dried fruits. Canned fruit in light or heavy syrup. Fruit juice. Meat and Other Protein Products Fatty cuts of meat. Ribs, chicken wings, bacon, sausage, bologna, salami, chitterlings, fatback, hot dogs, bratwurst, and packaged luncheon meats. Liver and organ meats. Dairy Whole or 2% milk, cream, half-and-half, and cream cheese. Whole milk cheeses. Whole-fat or sweetened yogurt. Full-fat cheeses. Nondairy creamers and whipped toppings. Processed cheese, cheese spreads, or cheese curds. Sweets and Desserts Corn syrup, sugars, honey, and molasses. Candy. Jam and jelly. Syrup. Sweetened cereals. Cookies, pies, cakes, donuts, muffins, and ice cream. Fats and Oils Butter, stick margarine, lard, shortening, ghee, or bacon fat. Coconut, palm kernel, or palm oils. Beverages Alcohol. Sweetened drinks (such as sodas, lemonade, and fruit drinks or punches). The items listed above may not be a complete list of foods and beverages to avoid. Contact your dietitian for more information. This information is not intended to replace advice given to you by your health care provider. Make sure you discuss any questions you have with your health care provider. Document Released: 09/18/2011 Document Revised: 11/24/2015 Document Reviewed: 06/18/2013 Elsevier Interactive Patient Education  2018 Elsevier Inc.  

## 2016-10-06 LAB — CBC WITH DIFFERENTIAL/PLATELET
BASOS ABS: 0 10*3/uL (ref 0.0–0.2)
Basos: 0 %
EOS (ABSOLUTE): 0.1 10*3/uL (ref 0.0–0.4)
Eos: 1 %
HEMOGLOBIN: 14.3 g/dL (ref 11.1–15.9)
Hematocrit: 41.8 % (ref 34.0–46.6)
IMMATURE GRANS (ABS): 0 10*3/uL (ref 0.0–0.1)
Immature Granulocytes: 0 %
LYMPHS: 34 %
Lymphocytes Absolute: 2.3 10*3/uL (ref 0.7–3.1)
MCH: 30.7 pg (ref 26.6–33.0)
MCHC: 34.2 g/dL (ref 31.5–35.7)
MCV: 90 fL (ref 79–97)
MONOCYTES: 6 %
Monocytes Absolute: 0.4 10*3/uL (ref 0.1–0.9)
Neutrophils Absolute: 3.9 10*3/uL (ref 1.4–7.0)
Neutrophils: 59 %
PLATELETS: 238 10*3/uL (ref 150–379)
RBC: 4.66 x10E6/uL (ref 3.77–5.28)
RDW: 13.2 % (ref 12.3–15.4)
WBC: 6.7 10*3/uL (ref 3.4–10.8)

## 2016-10-06 LAB — COMPREHENSIVE METABOLIC PANEL
ALBUMIN: 4.4 g/dL (ref 3.5–4.8)
ALT: 24 IU/L (ref 0–32)
AST: 21 IU/L (ref 0–40)
Albumin/Globulin Ratio: 1.8 (ref 1.2–2.2)
Alkaline Phosphatase: 89 IU/L (ref 39–117)
BUN / CREAT RATIO: 12 (ref 12–28)
BUN: 9 mg/dL (ref 8–27)
Bilirubin Total: 0.6 mg/dL (ref 0.0–1.2)
CO2: 23 mmol/L (ref 20–29)
CREATININE: 0.75 mg/dL (ref 0.57–1.00)
Calcium: 9.6 mg/dL (ref 8.7–10.3)
Chloride: 96 mmol/L (ref 96–106)
GFR calc Af Amer: 93 mL/min/{1.73_m2} (ref >59)
GFR calc non Af Amer: 80 mL/min/{1.73_m2} (ref >59)
GLUCOSE: 94 mg/dL (ref 65–99)
Globulin, Total: 2.5 g/dL (ref 1.5–4.5)
Potassium: 3.4 mmol/L — ABNORMAL LOW (ref 3.5–5.2)
Sodium: 134 mmol/L (ref 134–144)
TOTAL PROTEIN: 6.9 g/dL (ref 6.0–8.5)

## 2016-10-06 LAB — HEPATITIS C ANTIBODY: Hep C Virus Ab: <0.1 s/co ratio (ref 0.0–0.9)

## 2017-04-09 ENCOUNTER — Encounter: Payer: Medicare Other | Admitting: Family Medicine

## 2017-04-10 ENCOUNTER — Ambulatory Visit: Payer: Medicare Other

## 2017-04-23 ENCOUNTER — Ambulatory Visit: Payer: Medicare Other

## 2017-04-26 ENCOUNTER — Ambulatory Visit (INDEPENDENT_AMBULATORY_CARE_PROVIDER_SITE_OTHER): Payer: Medicare Other

## 2017-04-26 VITALS — BP 122/80 | HR 72 | Ht 64.0 in | Wt 166.0 lb

## 2017-04-26 DIAGNOSIS — Z1231 Encounter for screening mammogram for malignant neoplasm of breast: Secondary | ICD-10-CM

## 2017-04-26 DIAGNOSIS — E785 Hyperlipidemia, unspecified: Secondary | ICD-10-CM | POA: Diagnosis not present

## 2017-04-26 DIAGNOSIS — Z0000 Encounter for general adult medical examination without abnormal findings: Secondary | ICD-10-CM

## 2017-04-26 DIAGNOSIS — E2839 Other primary ovarian failure: Secondary | ICD-10-CM

## 2017-04-26 DIAGNOSIS — I10 Essential (primary) hypertension: Secondary | ICD-10-CM

## 2017-04-26 LAB — COMPREHENSIVE METABOLIC PANEL
ALBUMIN: 4.6 g/dL (ref 3.5–4.8)
ALK PHOS: 71 IU/L (ref 39–117)
ALT: 32 IU/L (ref 0–32)
AST: 30 IU/L (ref 0–40)
Albumin/Globulin Ratio: 2 (ref 1.2–2.2)
BUN / CREAT RATIO: 10 — AB (ref 12–28)
BUN: 9 mg/dL (ref 8–27)
Bilirubin Total: 0.4 mg/dL (ref 0.0–1.2)
CALCIUM: 10.3 mg/dL (ref 8.7–10.3)
CO2: 24 mmol/L (ref 20–29)
CREATININE: 0.89 mg/dL (ref 0.57–1.00)
Chloride: 98 mmol/L (ref 96–106)
GFR calc Af Amer: 75 mL/min/{1.73_m2} (ref >59)
GFR, EST NON AFRICAN AMERICAN: 65 mL/min/{1.73_m2} (ref >59)
GLUCOSE: 107 mg/dL — AB (ref 65–99)
Globulin, Total: 2.3 g/dL (ref 1.5–4.5)
Potassium: 4.2 mmol/L (ref 3.5–5.2)
Sodium: 140 mmol/L (ref 134–144)
Total Protein: 6.9 g/dL (ref 6.0–8.5)

## 2017-04-26 LAB — CBC WITH DIFFERENTIAL/PLATELET
BASOS: 1 %
Basophils Absolute: 0.1 10*3/uL (ref 0.0–0.2)
EOS (ABSOLUTE): 0.4 10*3/uL (ref 0.0–0.4)
EOS: 6 %
HEMATOCRIT: 44 % (ref 34.0–46.6)
HEMOGLOBIN: 14.7 g/dL (ref 11.1–15.9)
IMMATURE GRANULOCYTES: 0 %
Immature Grans (Abs): 0 10*3/uL (ref 0.0–0.1)
Lymphocytes Absolute: 1.6 10*3/uL (ref 0.7–3.1)
Lymphs: 24 %
MCH: 30.1 pg (ref 26.6–33.0)
MCHC: 33.4 g/dL (ref 31.5–35.7)
MCV: 90 fL (ref 79–97)
MONOCYTES: 8 %
Monocytes Absolute: 0.6 10*3/uL (ref 0.1–0.9)
NEUTROS PCT: 61 %
Neutrophils Absolute: 4.1 10*3/uL (ref 1.4–7.0)
Platelets: 257 10*3/uL (ref 150–379)
RBC: 4.89 x10E6/uL (ref 3.77–5.28)
RDW: 12.8 % (ref 12.3–15.4)
WBC: 6.6 10*3/uL (ref 3.4–10.8)

## 2017-04-26 LAB — LIPID PANEL
CHOLESTEROL TOTAL: 214 mg/dL — AB (ref 100–199)
Chol/HDL Ratio: 3.1 ratio (ref 0.0–4.4)
HDL: 69 mg/dL (ref >39)
LDL Calculated: 125 mg/dL — ABNORMAL HIGH (ref 0–99)
Triglycerides: 99 mg/dL (ref 0–149)
VLDL CHOLESTEROL CAL: 20 mg/dL (ref 5–40)

## 2017-04-26 NOTE — Patient Instructions (Addendum)
Erin Mckee , Thank you for taking time to come for your Medicare Wellness Visit. I appreciate your ongoing commitment to your health goals. Please review the following plan we discussed and let me know if I can assist you in the future.   Screening recommendations/referrals: Colonoscopy: up to date, next due 10/25/2024 Mammogram: Order in system, Please call the [facility] to have this scheduled Bone Density:  Order in system, Please call the [facility] Center to have this scheduled Recommended yearly ophthalmology/optometry visit for glaucoma screening and checkup Recommended yearly dental visit for hygiene and checkup.  Vaccinations: Influenza vaccine: up to date  Pneumococcal vaccine: up to date Tdap vaccine: up to date, next due 04/02/2018 Shingles vaccine: Check with your pharmacy about receiving the Shingrix vaccine    Advanced directives: Advance directive discussed with you today. I have provided a copy for you to complete at home and have notarized. Once this is complete please bring a copy in to our office so we can scan it into your chart.  Conditions/risks identified: Try to improve your flexibility and start a yoga class.  Next appointment: schedule follow up visit with PCP, next Medicare Wellness Visit is 04/29/2018 @ 8:20 am with [name] 72 Years and Older, Female Preventive care refers to lifestyle choices and visits with your health care provider that can promote health and wellness. What does preventive care include?  A yearly physical exam. This is also called an annual well check.  Dental exams once or twice a year.  Routine eye exams. Ask your health care provider how often you should have your eyes checked.  Personal lifestyle choices, including:  Daily care of your teeth and gums.  Regular physical activity.  Eating a healthy diet.  Avoiding tobacco and drug use.  Limiting alcohol use.  Practicing safe sex.  Taking  low-dose aspirin every day.  Taking vitamin and mineral supplements as recommended by your health care provider. What happens during an annual well check? The services and screenings done by your health care provider during your annual well check will depend on your age, overall health, lifestyle risk factors, and family history of disease. Counseling  Your health care provider may ask you questions about your:  Alcohol use.  Tobacco use.  Drug use.  Emotional well-being.  Home and relationship well-being.  Sexual activity.  Eating habits.  History of falls.  Memory and ability to understand (cognition).  Work and work Statistician.  Reproductive health. Screening  You may have the following tests or measurements:  Height, weight, and BMI.  Blood pressure.  Lipid and cholesterol levels. These may be checked every 5 years, or more frequently if you are over 28 years old.  Skin check.  Lung cancer screening. You may have this screening every year starting at age 59 if you have a 30-pack-year history of smoking and currently smoke or have quit within the past 15 years.  Fecal occult blood test (FOBT) of the stool. You may have this test every year starting at age 74.  Flexible sigmoidoscopy or colonoscopy. You may have a sigmoidoscopy every 5 years or a colonoscopy every 10 years starting at age 35.  Hepatitis C blood test.  Hepatitis B blood test.  Sexually transmitted disease (STD) testing.  Diabetes screening. This is done by checking your blood sugar (glucose) after you have not eaten for a while (fasting). You may have this done every 1-3 years.  Bone density scan. This  is done to screen for osteoporosis. You may have this done starting at age 64.  Mammogram. This may be done every 1-2 years. Talk to your health care provider about how often you should have regular mammograms. Talk with your health care provider about your test results, treatment options, and  if necessary, the need for more tests. Vaccines  Your health care provider may recommend certain vaccines, such as:  Influenza vaccine. This is recommended every year.  Tetanus, diphtheria, and acellular pertussis (Tdap, Td) vaccine. You may need a Td booster every 10 years.  Zoster vaccine. You may need this after age 36.  Pneumococcal 13-valent conjugate (PCV13) vaccine. One dose is recommended after age 43.  Pneumococcal polysaccharide (PPSV23) vaccine. One dose is recommended after age 61. Talk to your health care provider about which screenings and vaccines you need and how often you need them. This information is not intended to replace advice given to you by your health care provider. Make sure you discuss any questions you have with your health care provider. Document Released: 04/15/2015 Document Revised: 12/07/2015 Document Reviewed: 01/18/2015 Elsevier Interactive Patient Education  2017 [place] Prevention in the Home Falls can cause injuries. They can happen to people of all ages. There are many things you can do to make your home safe and to help prevent falls. What can I do on the outside of my home?  Regularly fix the edges of walkways and driveways and fix any cracks.  Remove anything that might make you trip as you walk through a door, such as a raised step or threshold.  Trim any bushes or trees on the path to your home.  Use bright outdoor lighting.  Clear any walking paths of anything that might make someone trip, such as rocks or tools.  Regularly check to see if handrails are loose or broken. Make sure that both sides of any steps have handrails.  Any raised decks and porches should have guardrails on the edges.  Have any leaves, snow, or ice cleared regularly.  Use sand or salt on walking paths during winter.  Clean up any spills in your garage right away. This includes oil or grease spills. What can I do in the bathroom?  Use night  lights.  Install grab bars by the toilet and in the tub and shower. Do not use towel bars as grab bars.  Use non-skid mats or decals in the tub or shower.  If you need to sit down in the shower, use a plastic, non-slip stool.  Keep the floor dry. Clean up any water that spills on the floor as soon as it happens.  Remove soap buildup in the tub or shower regularly.  Attach bath mats securely with double-sided non-slip rug tape.  Do not have throw rugs and other things on the floor that can make you trip. What can I do in the bedroom?  Use night lights.  Make sure that you have a light by your bed that is easy to reach.  Do not use any sheets or blankets that are too big for your bed. They should not hang down onto the floor.  Have a firm chair that has side arms. You can use this for support while you get dressed.  Do not have throw rugs and other things on the floor that can make you trip. What can I do in the kitchen?  Clean up any spills right away.  Avoid walking on wet floors.  Keep items that you use a lot in easy-to-reach places.  If you need to reach something above you, use a strong step stool that has a grab bar.  Keep electrical cords out of the way.  Do not use floor polish or wax that makes floors slippery. If you must use wax, use non-skid floor wax.  Do not have throw rugs and other things on the floor that can make you trip. What can I do with my stairs?  Do not leave any items on the stairs.  Make sure that there are handrails on both sides of the stairs and use them. Fix handrails that are broken or loose. Make sure that handrails are as long as the stairways.  Check any carpeting to make sure that it is firmly attached to the stairs. Fix any carpet that is loose or worn.  Avoid having throw rugs at the top or bottom of the stairs. If you do have throw rugs, attach them to the floor with carpet tape.  Make sure that you have a light switch at the  top of the stairs and the bottom of the stairs. If you do not have them, ask someone to add them for you. What else can I do to help prevent falls?  Wear shoes that:  Do not have high heels.  Have rubber bottoms.  Are comfortable and fit you well.  Are closed at the toe. Do not wear sandals.  If you use a stepladder:  Make sure that it is fully opened. Do not climb a closed stepladder.  Make sure that both sides of the stepladder are locked into place.  Ask someone to hold it for you, if possible.  Clearly mark and make sure that you can see:  Any grab bars or handrails.  First and last steps.  Where the edge of each step is.  Use tools that help you move around (mobility aids) if they are needed. These include:  Canes.  Walkers.  Scooters.  Crutches.  Turn on the lights when you go into a dark area. Replace any light bulbs as soon as they burn out.  Set up your furniture so you have a clear path. Avoid moving your furniture around.  If any of your floors are uneven, fix them.  If there are any pets around you, be aware of where they are.  Review your medicines with your doctor. Some medicines can make you feel dizzy. This can increase your chance of falling. Ask your doctor what other things that you can do to help prevent falls. This information is not intended to replace advice given to you by your health care provider. Make sure you discuss any questions you have with your health care provider. Document Released: 01/13/2009 Document Revised: 08/25/2015 Document Reviewed: 04/23/2014 Elsevier Interactive Patient Education  2017 [employer].

## 2017-04-26 NOTE — Progress Notes (Signed)
Subjective:   Erin Mckee is a 72 y.o. female who presents for Medicare Annual (Subsequent) preventive examination.  Review of Systems:  N/A Cardiac Risk Factors include: advanced age (>76mn, >>57women);dyslipidemia;hypertension     Objective:     Vitals: BP 122/80   Pulse 72   Ht 5' 4"  (1.626 m)   Wt 166 lb (75.3 kg)   SpO2 96%   BMI 28.49 kg/m   Body mass index is 28.49 kg/m.  Advanced Directives 04/26/2017 09/15/2015 10/12/2014 02/22/2014 02/14/2012 02/13/2012  Does Patient Have a Medical Advance Directive? No No No No - Patient has advance directive, copy not in chart  Would patient like information on creating a medical advance directive? Yes (MAU/Ambulatory/Procedural Areas - Information given) Yes - Educational materials given No - patient declined information Yes -Higher education careers advisergiven - -  Pre-existing out of facility DNR order (yellow form or pink MOST form) - - - - No -    Tobacco Social History   Tobacco Use  Smoking Status Never Smoker  Smokeless Tobacco Never Used     Counseling given: Not Answered   Clinical Intake:  Pre-visit preparation completed: Yes  Pain : No/denies pain     Nutritional Status: BMI 25 -29 Overweight Nutritional Risks: None Diabetes: No  How often do you need to have someone help you when you read instructions, pamphlets, or other written materials from your doctor or pharmacy?: 1 - Never What is the last grade level you completed in school?: Pharm D degree  Interpreter Needed?: No  Information entered by :: CAndrez Grime LPN  Past Medical History:  Diagnosis Date  . GERD (gastroesophageal reflux disease)   . Heart murmur    resolved- related to stress of throat closing , echo  done 2009  . Hepatitis    hep- a as a child  . Hyperlipidemia   . Hypertension    PCP manages htn, [facility]   .Marland KitchenNeuromuscular disorder (HCC)    spasmodic dysphonia   .  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 FRACTURE SURGERY    . OPEN REDUCTION INTERNAL FIXATION (ORIF) DISTAL RADIAL FRACTURE  02/14/2012   Procedure: OPEN REDUCTION INTERNAL FIXATION (ORIF) DISTAL RADIAL FRACTURE;  Surgeon: CMcarthur Rossetti MD;  [facility]  Service: Orthopedics;  Laterality: Right;  Open Reduction Internal Fixation Right Wrist  . TONSILLECTOMY     2009   Family History  Problem Relation Age of Onset  . Hypertension Mother   . Heart disease Mother 631      CAD/CABG  . Hyperlipidemia Mother   . Hypertension Father   . Heart disease Father        AMI  . Hyperlipidemia Father   . Multiple myeloma Father   . Hypertension Sister   . Thyroid disease Sister   . Hypertension Brother   . Crohn's disease Brother   . Cerebral palsy Son   . Hydrocephalus Son   . Colon cancer Neg Hx    Social History   Socioeconomic History  . Marital status: Married    Spouse name: None  . Number of children: 2  . Years of education: None  . Highest education level: Professional school degree (e.g., MD, DDS, DVM, JD)  Social Needs  . Financial resource strain: Not hard at all  . Food insecurity -  worry: Never true  . Food insecurity - inability: Never true  . Transportation needs - medical: No  . Transportation needs - non-medical: No  Occupational History  . Occupation: Software engineer    Comment: retired 2016; now PRN    Employer: [employer]  Tobacco Use  . Smoking status: Never Smoker  . Smokeless tobacco: Never Used  Substance and Sexual Activity  . Alcohol use: Yes    Alcohol/week: 3.6 oz    Types: 6 Glasses of wine per week    Comment: Occasional  . Drug use: No  . Sexual activity: Yes    Birth control/protection: Post-menopausal  Other Topics Concern  . None  Social History Tour manager   Married   Exercises    Outpatient Encounter Medications as of 04/26/2017    Medication Sig  . hydrochlorothiazide (HYDRODIURIL) 25 MG tablet Take 1 tablet (25 mg total) by mouth daily.  Marland Kitchen LORazepam (ATIVAN) 1 MG tablet Take 1 tablet (1 mg total) by mouth 2 (two) times daily as needed. For throat spasms  . metoprolol succinate (TOPROL-XL) 50 MG 24 hr tablet Take 1 tablet (50 mg total) by mouth daily.  Marland Kitchen OVER THE COUNTER MEDICATION Biotears 2 caps daily for dry eyes   No facility-administered encounter medications on file as of 04/26/2017.     Activities of Daily Living In your present state of health, do you have any difficulty performing the following activities: 04/26/2017  Hearing? N  Vision? Y  Comment Patient cannot see at night while driving well anymore  Difficulty concentrating or making decisions? N  Walking or climbing stairs? N  Dressing or bathing? N  Doing errands, shopping? N  Preparing Food and eating ? N  Using the Toilet? N  In the past six months, have you accidently leaked urine? Y  Comment Patient has some stress incontinence  Do you have problems with loss of bowel control? N  Managing your Medications? N  Managing your Finances? N  Housekeeping or managing your Housekeeping? N  Some recent data might be hidden    Patient Care Team: [name], MD as PCP - General (Family Medicine)    Assessment:   This is a routine wellness examination for [place].  Exercise Activities and Dietary recommendations Current Exercise Habits: The patient does not participate in regular exercise at present(Stays very active at home [place] her [place]), Exercise limited by: None identified  Goals    . Exercise 3x per week (30 min per time)     Patient states that she wants to improve her flexibility and is interested in starting a yoga class.        Fall Risk Fall Risk  04/26/2017 10/05/2016 09/15/2015 02/22/2014  Falls in the past year? No No No No   Is the patient's home free of loose throw rugs in walkways, pet beds, electrical cords,  etc?   yes      Grab bars in the bathroom? yes      Handrails on the stairs?   no doesn't have any steps      Adequate lighting?   yes  Timed Get Up and Go performed: yes, completed within 30 seconds  Depression Screen PHQ 2/9 Scores 04/26/2017 10/05/2016 09/15/2015 12/29/2014  PHQ - 2 Score 0 0 0 0     Cognitive Function     6CIT Screen 04/26/2017  What Year? 0 points  What month? 0 points  What time? 0 points  Count back from 20 0 points  Months in reverse 0 points  Repeat phrase 0 points  Total Score 0    Immunization History  Administered Date(s) Administered  . Hepatitis B 04/14/1997  . Influenza Split 01/17/2012, 12/15/2013  . Pneumococcal Conjugate-13 02/22/2014  . Pneumococcal Polysaccharide-23 04/14/2012  . Tdap 04/02/2008  . Zoster 02/01/2015    Qualifies for Shingles Vaccine? Zostavax completed 02/01/2015, Patient declined the Shingrix vaccine    Screening Tests Health Maintenance  Topic Date Due  . DEXA SCAN  09/02/2010  . MAMMOGRAM  11/11/2015  . TETANUS/TDAP  04/02/2018  . COLONOSCOPY  10/26/2019  . INFLUENZA VACCINE  Completed  . Hepatitis C Screening  Completed  . PNA vac Low Risk Adult  Completed    Cancer Screenings: Lung: Low Dose CT Chest recommended if Age 54-80 years, 30 pack-year currently smoking OR have quit w/in 15years. Patient does not qualify. Breast:  Up to date on Mammogram? No, Order sent to [facility], Patient will call and setup appointment. Up to date of Bone Density/Dexa? No, Order sent to [facility], Patient will call and setup appointment. Colorectal: colonoscopy completed 10/26/2014  Additional Screenings:  Hepatitis B/HIV/Syphillis: not indicated  Hepatitis C Screening: completed 10/05/2016     Plan:   I have personally reviewed and noted the following in the patient's chart:   . Medical and social history . Use of alcohol, tobacco or illicit drugs  . Current medications and supplements . Functional ability and  status . Nutritional status . Physical activity . Advanced directives . List of other physicians . Hospitalizations, surgeries, and ER visits in previous 12 months . Vitals . Screenings to include cognitive, depression, and falls . Referrals and appointments  In addition, I have reviewed and discussed with patient certain preventive protocols, quality metrics, and best practice recommendations. A written personalized care plan for preventive services as well as general preventive health recommendations were provided to patient.   1. Essential hypertension - Lipid panel - Comprehensive metabolic panel - CBC with Differential/Platelet  2. Hyperlipidemia, unspecified hyperlipidemia type - Lipid panel - Comprehensive metabolic panel - CBC with Differential/Platelet  3. Estrogen deficiency - DEXAScan  4. Encounter for screening mammogram for breast cancer - Mammogram Digital Screening  5. Encounter for Medicare annual wellness exam   Andrez Grime, LPN  [id number]

## 2017-06-21 ENCOUNTER — Other Ambulatory Visit: Payer: Self-pay | Admitting: Family Medicine

## 2017-06-21 DIAGNOSIS — Z1231 Encounter for screening mammogram for malignant neoplasm of breast: Secondary | ICD-10-CM

## 2017-07-17 ENCOUNTER — Ambulatory Visit
Admission: RE | Admit: 2017-07-17 | Discharge: 2017-07-17 | Disposition: A | Discharge status: Home / Self Care | Payer: Medicare Other | Source: Ambulatory Visit | Attending: Family Medicine | Admitting: Family Medicine

## 2017-07-17 DIAGNOSIS — M85851 Other specified disorders of bone density and structure, right thigh: Secondary | ICD-10-CM | POA: Diagnosis not present

## 2017-07-17 DIAGNOSIS — Z780 Asymptomatic menopausal state: Secondary | ICD-10-CM | POA: Diagnosis not present

## 2017-07-17 DIAGNOSIS — Z1231 Encounter for screening mammogram for malignant neoplasm of breast: Secondary | ICD-10-CM | POA: Diagnosis not present

## 2017-08-27 ENCOUNTER — Encounter: Payer: Self-pay | Admitting: Family Medicine

## 2017-09-12 ENCOUNTER — Encounter: Payer: Self-pay | Admitting: Family Medicine

## 2017-09-12 ENCOUNTER — Ambulatory Visit (INDEPENDENT_AMBULATORY_CARE_PROVIDER_SITE_OTHER): Payer: Medicare Other | Admitting: Family Medicine

## 2017-09-12 VITALS — BP 142/80 | HR 65 | Temp 98.4°F | Ht 64.0 in | Wt 165.0 lb

## 2017-09-12 DIAGNOSIS — R490 Dysphonia: Secondary | ICD-10-CM | POA: Diagnosis not present

## 2017-09-12 DIAGNOSIS — I10 Essential (primary) hypertension: Secondary | ICD-10-CM

## 2017-09-12 DIAGNOSIS — R739 Hyperglycemia, unspecified: Secondary | ICD-10-CM

## 2017-09-12 DIAGNOSIS — E785 Hyperlipidemia, unspecified: Secondary | ICD-10-CM

## 2017-09-12 DIAGNOSIS — J383 Other diseases of vocal cords: Secondary | ICD-10-CM | POA: Diagnosis not present

## 2017-09-12 MED ORDER — HYDROCHLOROTHIAZIDE 25 MG PO TABS: 25.0000 mg | ORAL_TABLET | Freq: Every day | ORAL | 3 refills | Status: DC

## 2017-09-12 MED ORDER — LORAZEPAM 1 MG PO TABS: 1.0000 mg | ORAL_TABLET | Freq: Two times a day (BID) | ORAL | 1 refills | Status: DC | PRN

## 2017-09-12 MED ORDER — METOPROLOL SUCCINATE ER 50 MG PO TB24: 50.0000 mg | ORAL_TABLET | Freq: Every day | ORAL | 3 refills | Status: DC

## 2017-09-12 NOTE — Progress Notes (Signed)
Subjective:  By signing my name below, I, Essence Howell, attest that this documentation has been prepared under the direction and in the presence of [name], MD Electronically Signed: Ladene Artist, ED Scribe 09/12/2017 at 9:47 AM.   Patient ID: Erin Mckee, female    DOB: 1945-10-26, 72 y.o.   MRN: [id number]  Chief Complaint  Patient presents with  . Medication Refill    all medication (only has 4 tabs left of Atavin)   HPI Erin Mckee is a 72 y.o. female who presents to Primary Care at [facility] for f/u. Last seen by myself in 09/2015 but was seen in f/u by Dr. Tamala Julian in 09/2016. Also had medicare wellness exam 1/25.  Dysphonia Episodic use of ativan has been helpful. Last filled July 2018 #60 with 1 refill. See prior notes. Has been treated by [facility] speech pathology and speech therapy in [place]. - Pt has been taking ativan about twice/wk which she states has calmed down vocal cord spasms.  Hyperlipidemia Lab Results  Component Value Date   CHOL 214 (H) 04/26/2017   HDL 69 04/26/2017   LDLCALC 125 (H) 04/26/2017   TRIG 99 04/26/2017   CHOLHDL 3.1 04/26/2017   Lab Results  Component Value Date   ALT 32 04/26/2017   AST 30 04/26/2017   ALKPHOS 71 04/26/2017   BILITOT 0.4 04/26/2017  The 10-year ASCVD risk score Mikey Bussing DC Jr., et al., 2013) is: 18.6%   Values used to calculate the score:     Age: 31 years     Sex: Female     Is Non-Hispanic African American: No     Diabetic: No     Tobacco smoker: No     Systolic Blood Pressure: 902 mmHg     Is BP treated: Yes     HDL Cholesterol: 69 mg/dL     Total Cholesterol: 214 mg/dL Discussed meds in the past but preferred diet approach. - Pt would like to have cholesterol rechecked today. She would consider starting statin if still elevated. States she has exercised some by chasing her horses. Denies polydipsia, urinary frequency, any other symptoms.  HTN BP Readings from Last 3 Encounters:  09/12/17 (!) 142/80    04/26/17 122/80  10/05/16 (!) 150/88   Lab Results  Component Value Date   CREATININE 0.89 04/26/2017  She has continued Toprol, HCTZ. - Pt has noticed fluctuation in her home systolic readings ranging from 117-150. Denies any side-effects.  Patient Active Problem List   Diagnosis Date Noted  . Distal radius fracture, right 02/14/2012  . Hyperlipidemia 09/13/2011  . HYPERTENSION 01/15/2008  . 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 DYSPNEA 01/15/2008  . PALPITATIONS, HX OF 01/15/2008   Past Medical History:  Diagnosis Date  . GERD (gastroesophageal reflux disease)   . Heart murmur    resolved- related to stress of throat closing , echo  done 2009  . Hepatitis    hep- a as a child  . Hyperlipidemia   . Hypertension    PCP manages htn, pt. also reports that she has seen Cardiology  at [facility] too.   Marland Kitchen Neuromuscular disorder (HCC)    spasmodic dysphonia   . 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 FRACTURE SURGERY    . OPEN REDUCTION INTERNAL FIXATION (ORIF) DISTAL RADIAL FRACTURE  02/14/2012   Procedure: OPEN REDUCTION INTERNAL FIXATION (ORIF) DISTAL RADIAL FRACTURE;  Surgeon:  Mcarthur Rossetti, MD;  Location: [facility];  Service: Orthopedics;  Laterality: Right;  Open Reduction Internal Fixation Right Wrist  . TONSILLECTOMY     2009   No Known Allergies Prior to Admission medications   Medication Sig Start Date End Date Taking? Authorizing Provider  calcium-vitamin D 250-100 MG-UNIT tablet Take 1 tablet by mouth 2 (two) times daily.   Yes [provider]  hydrochlorothiazide (HYDRODIURIL) 25 MG tablet Take 1 tablet (25 mg total) by mouth daily. 10/05/16  Yes Wardell Honour, MD  LORazepam (ATIVAN) 1 MG tablet Take 1 tablet (1 mg total) by mouth 2 (two) times daily as needed. For throat spasms 10/05/16  Yes Wardell Honour, MD  metoprolol succinate (TOPROL-XL) 50 MG 24 hr tablet Take 1 tablet (50 mg total)  by mouth daily. 10/05/16  Yes Wardell Honour, MD  OVER THE COUNTER MEDICATION Biotears 2 caps daily for dry eyes   Yes [provider]   Social History   Socioeconomic History  . Marital status: Married    Spouse name: Not on file  . Number of children: 2  . Years of education: Not on file  . Highest education level: Professional school degree (e.g., MD, DDS, DVM, JD)  Occupational History  . Occupation: Software engineer    Comment: retired 2016; now PRN    Employer: [employer]  . Financial resource strain: Not hard at all  . Food insecurity:    Worry: Never true    Inability: Never true  . Transportation needs:    Medical: No    Non-medical: No  Tobacco Use  . Smoking status: Never Smoker  . Smokeless tobacco: Never Used  Substance and Sexual Activity  . Alcohol use: Yes    Alcohol/week: 3.6 oz    Types: 6 Glasses of wine per week    Comment: Occasional  . Drug use: No  . Sexual activity: Yes    Birth control/protection: Post-menopausal  Lifestyle  . Physical activity:    Days per week: 0 days    Minutes per session: 0 min  . Stress: Not at all  Relationships  . Social connections:    Talks on phone: More than three times a week    Gets together: More than three times a week    Attends religious service: Never    Active member of club or organization: Yes    Attends meetings of clubs or organizations: More than 4 times per year    Relationship status: Married  . Intimate partner violence:    Fear of current or ex partner: No    Emotionally abused: No    Physically abused: No    Forced sexual activity: No  Other Topics Concern  . Not on file  Social History Narrative   Industrial/product designer   Married   Exercises   Review of Systems  Constitutional: Negative for fatigue and unexpected weight change.  Respiratory: Negative for chest tightness and shortness of breath.   Cardiovascular: Negative for chest pain, palpitations and leg swelling.    Gastrointestinal: Negative for abdominal pain and blood in stool.  Endocrine: Negative for polydipsia.  Genitourinary: Negative for frequency.  Neurological: Negative for dizziness, syncope, light-headedness and headaches.      Objective:   Physical Exam  Constitutional: She is oriented to person, place, and time. She appears well-developed and well-nourished.  HENT:  Head: Normocephalic and atraumatic.  Eyes: Pupils are equal, round, and reactive to  light. Conjunctivae and EOM are normal.  Neck: Carotid bruit is not present.  Cardiovascular: Normal rate, regular rhythm, normal heart sounds and intact distal pulses.  Pulmonary/Chest: Effort normal and breath sounds normal.  Abdominal: Soft. She exhibits no pulsatile midline mass. There is no tenderness.  Neurological: She is alert and oriented to person, place, and time.  Skin: Skin is warm and dry.  Psychiatric: She has a normal mood and affect. Her behavior is normal.  Vitals reviewed.  Vitals:   09/12/17 0921 09/12/17 0925  BP: (!) 150/88 (!) 142/80  Pulse: 65   Temp: 98.4 F (36.9 C)   TempSrc: Oral   SpO2: 96%   Weight: 165 lb (74.8 kg)   Height: 5\' 4"  (1.626 m)       Assessment & Plan:   Erin Mckee is a 72 y.o. female Hyperlipidemia, unspecified hyperlipidemia type - Plan: Comprehensive metabolic panel  -Previous elevated ASCVD risk, discussed statin.  Decided against medication initially, will repeat testing today and have a discussion again.  With risk factors hypertension, possible hyperglycemia, may need to encourage statin, even low-dose.  Dysphonia - Plan: LORazepam (ATIVAN) 1 MG tablet  Stable, voice improved on today's visit.  Ativan as needed for-vocal cord dysfunction/dysphonia.   Vocal cord dysfunction - Plan: LORazepam (ATIVAN) 1 MG tablet  -As above  Essential hypertension - Plan: metoprolol succinate (TOPROL-XL) 50 MG 24 hr tablet, hydrochlorothiazide (HYDRODIURIL) 25 MG tablet, Comprehensive  metabolic panel, Lipid panel  -Variable home readings, has had some low readings as well as in the 140s.  May have a component of isolated systolic hypertension.  Based on variability will continue same regimen at this time.  Labs pending  Hyperglycemia - Plan: Hemoglobin A1c  -Check A1c for diabetes screening.  Continue activity/exercise.  Meds ordered this encounter  Medications  . LORazepam (ATIVAN) 1 MG tablet    Sig: Take 1 tablet (1 mg total) by mouth 2 (two) times daily as needed. For throat spasms    Dispense:  60 tablet    Refill:  1  . metoprolol succinate (TOPROL-XL) 50 MG 24 hr tablet    Sig: Take 1 tablet (50 mg total) by mouth daily.    Dispense:  90 tablet    Refill:  3  . hydrochlorothiazide (HYDRODIURIL) 25 MG tablet    Sig: Take 1 tablet (25 mg total) by mouth daily.    Dispense:  90 tablet    Refill:  3   Patient Instructions   No change in blood pressure medication for now, Ativan was refilled.   Depending on cholesterol level, can discuss statin medication again.  I will also check your blood sugar 40-month average as it was borderline elevated previously.  Thank you for coming in today, follow-up in 6 months.   IF you received an x-ray today, you will receive an invoice from [facility]. Please contact [facility] at [phone] with questions or concerns regarding your invoice.   IF you received labwork today, you will receive an invoice from [facility]. Please contact [facility] at [phone] with questions or concerns regarding your invoice.   Our billing staff will not be able to assist you with questions regarding bills from these companies.  You will be contacted with the lab results as soon as they are available. The fastest way to get your results is to activate your My Chart account. Instructions are located on the last page of this paperwork. If you have not heard from [place] regarding the  results in 2 weeks, please contact this office.         I personally performed the services described in this documentation, which was scribed in my presence. The recorded information has been reviewed and considered for accuracy and completeness, addended by me as needed, and agree with information above.  Signed,   Merri Ray, MD [facility] [place].  09/12/17 10:00 AM

## 2017-09-12 NOTE — Patient Instructions (Addendum)
No change in blood pressure medication for now, Ativan was refilled.   Depending on cholesterol level, can discuss statin medication again.  I will also check your blood sugar 72-month average as it was borderline elevated previously.  Thank you for coming in today, follow-up in 6 months.   IF you received an x-ray today, you will receive an invoice from [facility]. Please contact [facility] at [phone] with questions or concerns regarding your invoice.   IF you received labwork today, you will receive an invoice from [facility]. Please contact [facility] at [phone] with questions or concerns regarding your invoice.   Our billing staff will not be able to assist you with questions regarding bills from these companies.  You will be contacted with the lab results as soon as they are available. The fastest way to get your results is to activate your My Chart account. Instructions are located on the last page of this paperwork. If you have not heard from [place] regarding the results in 2 weeks, please contact this office.

## 2017-09-13 LAB — LIPID PANEL
CHOLESTEROL TOTAL: 254 mg/dL — AB (ref 100–199)
Chol/HDL Ratio: 4 ratio (ref 0.0–4.4)
HDL: 64 mg/dL (ref >39)
LDL Calculated: 157 mg/dL — ABNORMAL HIGH (ref 0–99)
TRIGLYCERIDES: 164 mg/dL — AB (ref 0–149)
VLDL Cholesterol Cal: 33 mg/dL (ref 5–40)

## 2017-09-13 LAB — COMPREHENSIVE METABOLIC PANEL
ALK PHOS: 84 IU/L (ref 39–117)
ALT: 22 IU/L (ref 0–32)
AST: 21 IU/L (ref 0–40)
Albumin/Globulin Ratio: 2 (ref 1.2–2.2)
Albumin: 4.7 g/dL (ref 3.5–4.8)
BILIRUBIN TOTAL: 0.5 mg/dL (ref 0.0–1.2)
BUN/Creatinine Ratio: 14 (ref 12–28)
BUN: 12 mg/dL (ref 8–27)
CALCIUM: 10.6 mg/dL — AB (ref 8.7–10.3)
CHLORIDE: 102 mmol/L (ref 96–106)
CO2: 24 mmol/L (ref 20–29)
Creatinine, Ser: 0.84 mg/dL (ref 0.57–1.00)
GFR calc Af Amer: 80 mL/min/{1.73_m2} (ref >59)
GFR, EST NON AFRICAN AMERICAN: 70 mL/min/{1.73_m2} (ref >59)
Globulin, Total: 2.4 g/dL (ref 1.5–4.5)
Glucose: 99 mg/dL (ref 65–99)
POTASSIUM: 4.3 mmol/L (ref 3.5–5.2)
Sodium: 141 mmol/L (ref 134–144)
Total Protein: 7.1 g/dL (ref 6.0–8.5)

## 2017-09-13 LAB — HEMOGLOBIN A1C
Est. average glucose Bld gHb Est-mCnc: 105 mg/dL
Hgb A1c MFr Bld: 5.3 % (ref 4.8–5.6)

## 2018-04-29 ENCOUNTER — Ambulatory Visit: Payer: Medicare Other

## 2018-04-29 ENCOUNTER — Ambulatory Visit (INDEPENDENT_AMBULATORY_CARE_PROVIDER_SITE_OTHER): Payer: Medicare Other | Admitting: Family Medicine

## 2018-04-29 VITALS — BP 140/72 | Ht 64.25 in | Wt 168.8 lb

## 2018-04-29 DIAGNOSIS — Z0000 Encounter for general adult medical examination without abnormal findings: Secondary | ICD-10-CM | POA: Diagnosis not present

## 2018-04-29 NOTE — Progress Notes (Signed)
Presents today for Medicare Annual Wellness Visit   Patient Care Team: [name], MD as PCP - General (Family Medicine)   Immunization status:  Immunization History  Administered Date(s) Administered  . Hepatitis B 04/14/1997  . Influenza Split 01/17/2012, 12/15/2013  . Pneumococcal Conjugate-13 02/22/2014  . Pneumococcal Polysaccharide-23 04/14/2012  . Tdap 04/02/2008  . Zoster 02/01/2015     Health Maintenance Due  Topic Date Due  . Samul Dada  04/02/2018     Functional Status Survey: Is the patient deaf or have difficulty hearing?: No Does the patient have difficulty seeing, even when wearing glasses/contacts?: No Does the patient have difficulty concentrating, remembering, or making decisions?: No Does the patient have difficulty walking or climbing stairs?: No Does the patient have difficulty dressing or bathing?: No Does the patient have difficulty doing errands alone such as visiting a doctor's office or shopping?: No   6CIT Screen 04/29/2018 04/26/2017  What Year? 0 points 0 points  What month? 0 points 0 points  What time? 0 points 0 points  Count back from 20 0 points 0 points  Months in reverse 0 points 0 points  Repeat phrase 0 points 0 points  Total Score 0 0         Patient Active Problem List   Diagnosis Date Noted  . Distal radius fracture, right 02/14/2012  . Hyperlipidemia 09/13/2011  . HYPERTENSION 01/15/2008  . 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 DYSPNEA 01/15/2008  . PALPITATIONS, HX OF 01/15/2008     Past Medical History:  Diagnosis Date  . GERD (gastroesophageal reflux disease)   . Heart murmur    resolved- related to stress of throat closing , echo  done 2009  . Hepatitis    hep- a as a child  . Hyperlipidemia   . Hypertension    PCP manages htn, pt. also reports that she has seen Cardiology  at [facility] too.   Marland Kitchen Neuromuscular disorder (HCC)    spasmodic dysphonia   . Spasmodic dysphonia    being seen by  speech therapy     Past Surgical History:  Procedure Laterality Date  . FRACTURE SURGERY    . OPEN REDUCTION INTERNAL FIXATION (ORIF) DISTAL RADIAL FRACTURE  02/14/2012   Procedure: OPEN REDUCTION INTERNAL FIXATION (ORIF) DISTAL RADIAL FRACTURE;  Surgeon: Mcarthur Rossetti, MD;  Location: [facility];  Service: Orthopedics;  Laterality: Right;  Open Reduction Internal Fixation Right Wrist  . TONSILLECTOMY     2009     Family History  Problem Relation Age of Onset  . Hypertension Mother   . Heart disease Mother 71       CAD/CABG  . Hyperlipidemia Mother   . Hypertension Father   . Heart disease Father        AMI  . Hyperlipidemia Father   . Multiple myeloma Father   . Hypertension Sister   . Thyroid disease Sister   . Hypertension Brother   . Crohn's disease Brother   . Cerebral palsy Son   . Hydrocephalus Son   . Colon cancer Neg Hx      Social History   Socioeconomic History  . Marital status: Married    Spouse name: Not on file  . Number of children: 2  . Years of education: Not on file  . Highest education level: Professional school degree (e.g., MD, DDS, DVM, JD)  Occupational History  . Occupation: Software engineer    Comment: retired 2016; now PRN    Employer:  [employer]  Social Needs  . Financial resource strain: Not hard at all  . Food insecurity:    Worry: Never true    Inability: Never true  . Transportation needs:    Medical: No    Non-medical: No  Tobacco Use  . Smoking status: Never Smoker  . Smokeless tobacco: Never Used  Substance and Sexual Activity  . Alcohol use: Yes    Alcohol/week: 6.0 standard drinks    Types: 6 Glasses of wine per week    Comment: Occasional  . Drug use: No  . Sexual activity: Yes    Birth control/protection: Post-menopausal  Lifestyle  . Physical activity:    Days per week: 0 days    Minutes per session: 0 min  . Stress: Not at all  Relationships  . Social connections:    Talks on phone: More than three times a  week    Gets together: More than three times a week    Attends religious service: Never    Active member of club or organization: Yes    Attends meetings of clubs or organizations: More than 4 times per year    Relationship status: Married  . Intimate partner violence:    Fear of current or ex partner: No    Emotionally abused: No    Physically abused: No    Forced sexual activity: No  Other Topics Concern  . Not on file  Social History Narrative   Industrial/product designer   Married   Exercises     No Known Allergies   Prior to Admission medications   Medication Sig Start Date End Date Taking? Authorizing Provider  calcium-vitamin D 250-100 MG-UNIT tablet Take 1 tablet by mouth 2 (two) times daily.   Yes [provider]  hydrochlorothiazide (HYDRODIURIL) 25 MG tablet Take 1 tablet (25 mg total) by mouth daily. 09/12/17  Yes [name], MD  LORazepam (ATIVAN) 1 MG tablet Take 1 tablet (1 mg total) by mouth 2 (two) times daily as needed. For throat spasms 09/12/17  Yes [name], MD  metoprolol succinate (TOPROL-XL) 50 MG 24 hr tablet Take 1 tablet (50 mg total) by mouth daily. 09/12/17  Yes [name], MD  OVER THE COUNTER MEDICATION Biotears 2 caps daily for dry eyes   Yes [provider]     Depression screen [facility] 2/9 04/29/2018 09/12/2017 04/26/2017 10/05/2016 09/15/2015  Decreased Interest 0 0 0 0 0  Down, Depressed, Hopeless 0 0 0 0 0  PHQ - 2 Score 0 0 0 0 0     Fall Risk  04/29/2018 09/12/2017 04/26/2017 10/05/2016 09/15/2015  Falls in the past year? 0 No No No No  Number falls in past yr: 0 - - - -  Injury with Fall? 0 - - - -      PHYSICAL EXAM: BP 140/72   Ht 5' 4.25" (1.632 m)   Wt 168 lb 12.8 oz (76.6 kg)   BMI 28.75 kg/m    Wt Readings from Last 3 Encounters:  04/29/18 168 lb 12.8 oz (76.6 kg)  09/12/17 165 lb (74.8 kg)  04/26/17 166 lb (75.3 kg)     No exam data present    Physical Exam   Education/Counseling  provided regarding diet and exercise, prevention of chronic diseases, smoking/tobacco cessation, if applicable, and reviewed "Covered Medicare Preventive Services."   ASSESSMENT/PLAN: 1. Medicare annual wellness visit, subsequent     Nutritional Status: BMI 25 -29 Overweight  Nutritional Risks: None Diabetes: No  Qualifies for Shingles Vaccine? Zostavax completed 02/01/2015, Patient declined the Shingrix vaccine    Is the patient's home free of loose throw rugs in walkways, pet beds, electrical cords, etc?   yes      Grab bars in the bathroom? yes      Handrails on the stairs?   no doesn't have any steps      Adequate lighting?   yes  Exercise Activities and Dietary recommendations Current Exercise Habits: The patient does not participate in regular exercise at present(Stays very active at home on her [place]), Exercise limited by: None identified   [place] eye appointment 04/30/2018.

## 2018-04-29 NOTE — Patient Instructions (Signed)
Thank you for taking time to come for your Medicare Wellness Visit. I appreciate your ongoing commitment to your health goals. Please review the following plan we discussed and let me know if I can assist you in the future.  Leroy Kennedy LPN   Healthy Eating Following a healthy eating pattern may help you to achieve and maintain a healthy body weight, reduce the risk of chronic disease, and live a long and productive life. It is important to follow a healthy eating pattern at an appropriate calorie level for your body. Your nutritional needs should be met primarily through food by choosing a variety of nutrient-rich foods. What are tips for following this plan? Reading food labels  Read labels and choose the following: ? Reduced or low sodium. ? Juices with 100% fruit juice. ? Foods with low saturated fats and high polyunsaturated and monounsaturated fats. ? Foods with whole grains, such as whole wheat, cracked wheat, brown rice, and wild rice. ? Whole grains that are fortified with folic acid. This is recommended for women who are pregnant or who want to become pregnant.  Read labels and avoid the following: ? Foods with a lot of added sugars. These include foods that contain brown sugar, corn sweetener, corn syrup, dextrose, fructose, glucose, high-fructose corn syrup, honey, invert sugar, lactose, malt syrup, maltose, molasses, raw sugar, sucrose, trehalose, or turbinado sugar.  Do not eat more than the following amounts of added sugar per day:  6 teaspoons (25 g) for women.  9 teaspoons (38 g) for men. ? Foods that contain processed or refined starches and grains. ? Refined grain products, such as white flour, degermed cornmeal, white bread, and white rice. Shopping  Choose nutrient-rich snacks, such as vegetables, whole fruits, and nuts. Avoid high-calorie and high-sugar snacks, such as potato chips, fruit snacks, and candy.  Use oil-based dressings and spreads on foods instead of  solid fats such as butter, stick margarine, or cream cheese.  Limit pre-made sauces, mixes, and "instant" products such as flavored rice, instant noodles, and ready-made pasta.  Try more plant-protein sources, such as tofu, tempeh, black beans, edamame, lentils, nuts, and seeds.  Explore eating plans such as the Mediterranean diet or vegetarian diet. Cooking  Use oil to saut or stir-fry foods instead of solid fats such as butter, stick margarine, or lard.  Try baking, boiling, grilling, or broiling instead of frying.  Remove the fatty part of meats before cooking.  Steam vegetables in water or broth. Meal planning   At meals, imagine dividing your plate into fourths: ? One-half of your plate is fruits and vegetables. ? One-fourth of your plate is whole grains. ? One-fourth of your plate is protein, especially lean meats, poultry, eggs, tofu, beans, or nuts.  Include low-fat dairy as part of your daily diet. Lifestyle  Choose healthy options in all settings, including home, work, school, restaurants, or stores.  Prepare your food safely: ? Wash your hands after handling raw meats. ? Keep food preparation surfaces clean by regularly washing with hot, soapy water. ? Keep raw meats separate from ready-to-eat foods, such as fruits and vegetables. ? Cook seafood, meat, poultry, and eggs to the recommended internal temperature. ? Store foods at safe temperatures. In general:  Keep cold foods at 39F (4.4C) or below.  Keep hot foods at 139F (60C) or above.  Keep your freezer at [facility] (-17.8C) or below.  Foods are no longer safe to eat when they have been between the temperatures of 40-139F (4.4-60C)  for more than 2 hours. What foods should I eat? Fruits Aim to eat 2 cup-equivalents of fresh, canned (in natural juice), or frozen fruits each day. Examples of 1 cup-equivalent of fruit include 1 small apple, 8 large strawberries, 1 cup canned fruit,  cup dried fruit, or 1 cup  100% juice. Vegetables Aim to eat 2-3 cup-equivalents of fresh and frozen vegetables each day, including different varieties and colors. Examples of 1 cup-equivalent of vegetables include 2 medium carrots, 2 cups raw, leafy greens, 1 cup chopped vegetable (raw or cooked), or 1 medium baked potato. Grains Aim to eat 6 ounce-equivalents of whole grains each day. Examples of 1 ounce-equivalent of grains include 1 slice of bread, 1 cup ready-to-eat cereal, 3 cups popcorn, or  cup cooked rice, pasta, or cereal. Meats and other proteins Aim to eat 5-6 ounce-equivalents of protein each day. Examples of 1 ounce-equivalent of protein include 1 egg, 1/2 cup nuts or seeds, or 1 tablespoon (16 g) peanut butter. A cut of meat or fish that is the size of a deck of cards is about 3-4 ounce-equivalents.  Of the protein you eat each week, try to have at least 8 ounces come from seafood. This includes salmon, trout, herring, and anchovies. Dairy Aim to eat 3 cup-equivalents of fat-free or low-fat dairy each day. Examples of 1 cup-equivalent of dairy include 1 cup (240 mL) milk, 8 ounces (250 g) yogurt, 1 ounces (44 g) natural cheese, or 1 cup (240 mL) fortified soy milk. Fats and oils  Aim for about 5 teaspoons (21 g) per day. Choose monounsaturated fats, such as canola and olive oils, avocados, peanut butter, and most nuts, or polyunsaturated fats, such as sunflower, corn, and soybean oils, walnuts, pine nuts, sesame seeds, sunflower seeds, and flaxseed. Beverages  Aim for six 8-oz glasses of water per day. Limit coffee to three to five 8-oz cups per day.  Limit caffeinated beverages that have added calories, such as soda and energy drinks.  Limit alcohol intake to no more than 1 drink a day for nonpregnant women and 2 drinks a day for men. One drink equals 12 oz of beer (355 mL), 5 oz of wine (148 mL), or 1 oz of hard liquor (44 mL). Seasoning and other foods  Avoid adding excess amounts of salt to your  foods. Try flavoring foods with herbs and spices instead of salt.  Avoid adding sugar to foods.  Try using oil-based dressings, sauces, and spreads instead of solid fats. This information is based on general [place] nutrition guidelines. For more information, visit Build[employer].es. Exact amounts may vary based on your nutrition needs. Summary  A healthy eating plan may help you to maintain a healthy weight, reduce the risk of chronic diseases, and stay active throughout your life.  Plan your meals. Make sure you eat the right portions of a variety of nutrient-rich foods.  Try baking, boiling, grilling, or broiling instead of frying.  Choose healthy options in all settings, including home, work, school, restaurants, or stores. This information is not intended to replace advice given to you by your health care provider. Make sure you discuss any questions you have with your health care provider. Document Released: 07/01/2017 Document Revised: 07/01/2017 Document Reviewed: 07/01/2017 Elsevier Interactive Patient Education  2019 [employer].

## 2018-04-30 DIAGNOSIS — H2513 Age-related nuclear cataract, bilateral: Secondary | ICD-10-CM | POA: Diagnosis not present

## 2018-07-03 ENCOUNTER — Encounter (HOSPITAL_COMMUNITY): Payer: Self-pay | Admitting: Emergency Medicine

## 2018-07-03 ENCOUNTER — Emergency Department (HOSPITAL_COMMUNITY)
Admission: EM | Admit: 2018-07-03 | Discharge: 2018-07-04 | Disposition: A | Discharge status: Home / Self Care | Payer: Medicare Other | Attending: Emergency Medicine | Admitting: Emergency Medicine

## 2018-07-03 ENCOUNTER — Other Ambulatory Visit: Payer: Self-pay

## 2018-07-03 DIAGNOSIS — Y9352 Activity, horseback riding: Secondary | ICD-10-CM | POA: Insufficient documentation

## 2018-07-03 DIAGNOSIS — Y929 Unspecified place or not applicable: Secondary | ICD-10-CM | POA: Insufficient documentation

## 2018-07-03 DIAGNOSIS — I10 Essential (primary) hypertension: Secondary | ICD-10-CM | POA: Diagnosis not present

## 2018-07-03 DIAGNOSIS — S42211A Unspecified displaced fracture of surgical neck of right humerus, initial encounter for closed fracture: Secondary | ICD-10-CM | POA: Insufficient documentation

## 2018-07-03 DIAGNOSIS — Y999 Unspecified external cause status: Secondary | ICD-10-CM | POA: Diagnosis not present

## 2018-07-03 DIAGNOSIS — Z79899 Other long term (current) drug therapy: Secondary | ICD-10-CM | POA: Insufficient documentation

## 2018-07-03 NOTE — ED Triage Notes (Signed)
Pt reports she fell off a horse this afternoon. Pt reports she landed on her R shoulder. Pt has significant swelling to R shoulder and extreme pain with movement. Pt denies any other complaints.

## 2018-07-04 ENCOUNTER — Telehealth (INDEPENDENT_AMBULATORY_CARE_PROVIDER_SITE_OTHER): Payer: Self-pay

## 2018-07-04 ENCOUNTER — Emergency Department (HOSPITAL_COMMUNITY): Payer: Medicare Other

## 2018-07-04 DIAGNOSIS — S42211A Unspecified displaced fracture of surgical neck of right humerus, initial encounter for closed fracture: Secondary | ICD-10-CM | POA: Diagnosis not present

## 2018-07-04 MED ORDER — OXYCODONE-ACETAMINOPHEN 5-325 MG PO TABS: 1.0000 | ORAL_TABLET | Freq: Three times a day (TID) | ORAL | 0 refills | Status: AC | PRN

## 2018-07-04 MED ORDER — ACETAMINOPHEN 500 MG PO TABS: 1000.0000 mg | ORAL_TABLET | Freq: Three times a day (TID) | ORAL | 0 refills | Status: AC

## 2018-07-04 NOTE — ED Provider Notes (Signed)
[facility] EMERGENCY DEPARTMENT Provider Note  CSN: [id number] Arrival date & time: 07/03/18 2317  Chief Complaint(s) Fall  HPI Erin Mckee is a 73 y.o. female who presents to the emergency department with right shoulder pain following a fall from horse that occurred 7 hours prior to arrival.  She reports that she was in [place] at the time of the incident.  States that she fell onto her right shoulder and back.  Is complaining of right shoulder pain that is mild to severe in intensity.  Throbbing and achy.  Exacerbated with movement but has significant pain alleviation with immobility.  Patient reports that she drove herself down from [place].  Denied any head trauma or loss of consciousness.  Currently denies any headache, neck pain, back pain, chest pain, abdominal pain, hip pain, other extremity pain.  HPI  Past Medical History Past Medical History:  Diagnosis Date  . GERD (gastroesophageal reflux disease)   . Heart murmur    resolved- related to stress of throat closing , echo  done 2009  . Hepatitis    hep- a as a child  . Hyperlipidemia   . Hypertension    PCP manages htn, pt. also reports that she has seen Cardiology  at [facility] too.   Marland Kitchen Neuromuscular disorder (HCC)    spasmodic dysphonia   . 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 Distal radius fracture, right 02/14/2012  . Hyperlipidemia 09/13/2011  . HYPERTENSION 01/15/2008  . 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 DYSPNEA 01/15/2008  . PALPITATIONS, HX OF 01/15/2008   Home Medication(s) Prior to Admission medications   Medication Sig Start Date End Date Taking? Authorizing Provider  acetaminophen (TYLENOL) 500 MG tablet Take 2 tablets (1,000 mg total) by mouth every 8 (eight) hours for 5 days. Do not take more than 4000 mg of acetaminophen (Tylenol) in a 24-hour period. Please note that other medicines that you may be  prescribed may have Tylenol as well. 07/04/18 07/09/18  Fatima Blank, MD  calcium-vitamin D 250-100 MG-UNIT tablet Take 1 tablet by mouth 2 (two) times daily.    [provider]  hydrochlorothiazide (HYDRODIURIL) 25 MG tablet Take 1 tablet (25 mg total) by mouth daily. 09/12/17   [name], MD  LORazepam (ATIVAN) 1 MG tablet Take 1 tablet (1 mg total) by mouth 2 (two) times daily as needed. For throat spasms 09/12/17   [name], MD  metoprolol succinate (TOPROL-XL) 50 MG 24 hr tablet Take 1 tablet (50 mg total) by mouth daily. 09/12/17   [name], MD  OVER THE COUNTER MEDICATION Biotears 2 caps daily for dry eyes    [provider]  oxyCODONE-acetaminophen (PERCOCET) 5-325 MG tablet Take 1 tablet by mouth every 8 (eight) hours as needed for up to 5 days for severe pain. Please do not exceed 4000 mg of acetaminophen (Tylenol) a 24-hour period. Please note that he may be prescribed additional medicine that contains acetaminophen. 07/04/18 07/09/18  Fatima Blank, MD  Past Surgical History Past Surgical History:  Procedure Laterality Date  . FRACTURE SURGERY    . OPEN REDUCTION INTERNAL FIXATION (ORIF) DISTAL RADIAL FRACTURE  02/14/2012   Procedure: OPEN REDUCTION INTERNAL FIXATION (ORIF) DISTAL RADIAL FRACTURE;  Surgeon: Mcarthur Rossetti, MD;  Location: [place];  Service: Orthopedics;  Laterality: Right;  Open Reduction Internal Fixation Right Wrist  . TONSILLECTOMY     2009   Family History Family History  Problem Relation Age of Onset  . Hypertension Mother   . Heart disease Mother 34       CAD/CABG  . Hyperlipidemia Mother   . Hypertension Father   . Heart disease Father        AMI  . Hyperlipidemia Father   . Multiple myeloma Father   . Hypertension Sister   . Thyroid disease Sister   . Hypertension  Brother   . Crohn's disease Brother   . Cerebral palsy Son   . Hydrocephalus Son   . Colon cancer Neg Hx     Social History Social History   Tobacco Use  . Smoking status: Never Smoker  . Smokeless tobacco: Never Used  Substance Use Topics  . Alcohol use: Yes    Alcohol/week: 6.0 standard drinks    Types: 6 Glasses of wine per week    Comment: Occasional  . Drug use: No   Allergies Patient has no known allergies.  Review of Systems Review of Systems All other systems are reviewed and are negative for acute change except as noted in the HPI  Physical Exam Vital Signs  I have reviewed the triage vital signs BP 136/77 (BP Location: Left Arm)   Pulse 84   Temp 98.3 F (36.8 C) (Oral)   Resp 16   SpO2 98%   Physical Exam Constitutional:      General: She is not in acute distress.    Appearance: She is well-developed. She is not diaphoretic.  HENT:     Head: Normocephalic and atraumatic.     Right Ear: External ear normal.     Left Ear: External ear normal.     Nose: Nose normal.  Eyes:     General: No scleral icterus.       Right eye: No discharge.        Left eye: No discharge.     Conjunctiva/sclera: Conjunctivae normal.     Pupils: Pupils are equal, round, and reactive to light.  Neck:     Musculoskeletal: Normal range of motion and neck supple.  Cardiovascular:     Rate and Rhythm: Normal rate and regular rhythm.     Pulses:          Radial pulses are 2+ on the right side and 2+ on the left side.       Dorsalis pedis pulses are 2+ on the right side and 2+ on the left side.     Heart sounds: Normal heart sounds. No murmur. No friction rub. No gallop.   Pulmonary:     Effort: Pulmonary effort is normal. No respiratory distress.     Breath sounds: Normal breath sounds. No stridor. No wheezing.  Abdominal:     General: There is no distension.     Palpations: Abdomen is soft.     Tenderness: There is no abdominal tenderness.  Musculoskeletal:     Right  shoulder: She exhibits tenderness, bony tenderness and swelling. She exhibits normal range of motion, normal pulse and normal strength.  Cervical back: She exhibits no bony tenderness.     Thoracic back: She exhibits no bony tenderness.     Lumbar back: She exhibits no bony tenderness.     Comments: Clavicles stable. Chest stable to AP/Lat compression. Pelvis stable to Lat compression. No obvious extremity deformity. No chest or abdominal wall contusion.  Skin:    General: Skin is warm and dry.     Findings: No erythema or rash.  Neurological:     Mental Status: She is alert and oriented to person, place, and time.     Comments: Moving all extremities     ED Results and Treatments Labs (all labs ordered are listed, but only abnormal results are displayed) Labs Reviewed - No data to display                                                                                                                       EKG  EKG Interpretation  Date/Time:    Ventricular Rate:    PR Interval:    QRS Duration:   QT Interval:    QTC Calculation:   R Axis:     Text Interpretation:        Radiology Dg Shoulder Right  Result Date: 07/04/2018 CLINICAL DATA:  73 year old female with right shoulder injury. EXAM: RIGHT SHOULDER - 2+ VIEW COMPARISON:  Chest radiograph dated 02/13/2012 FINDINGS: There is a mildly displaced fracture of the right humeral neck. Evaluation for dislocation is somewhat limited on the provided images however, the humeral head appears in anatomic alignment with the glenoid. The bones are osteopenic. There is mild degenerative changes of the shoulder joint. There is a moderate-sized joint effusion. A 4 mm nodule in the right lower lung field appears stable since 2013, likely a granuloma. IMPRESSION: Mildly displaced fracture of the right humeral neck. Electronically Signed   By: Anner Crete M.D.   On: 07/04/2018 00:15   Pertinent labs & imaging results that were  available during my care of the patient were reviewed by me and considered in my medical decision making (see chart for details).  Medications Ordered in ED Medications - No data to display                                                                                                                                  Procedures Procedures  (including critical care time)  Medical Decision Making / ED  Course I have reviewed the nursing notes for this encounter and the patient's prior records (if available in EHR or on provided paperwork).    Fall from horse resulting in right humeral neck fracture.  Appears to be in the socket.  Neurovascular intact distally.  Pain is currently managed with immobility and patient declined any additional pain medicine.  She is already established with Dr. Ninfa Linden.  She is instructed to follow-up with orthopedic surgery for further management.  The patient appears reasonably screened and/or stabilized for discharge and I doubt any other medical condition or other [facility] requiring further screening, evaluation, or treatment in the ED at this time prior to discharge.  The patient is safe for discharge with strict return precautions.   Final Clinical Impression(s) / ED Diagnoses Final diagnoses:  Closed displaced fracture of surgical neck of right humerus, unspecified fracture morphology, initial encounter  Fall from horse, initial encounter   Disposition: Discharge  Condition: Good  I have discussed the results, Dx and Tx plan with the patient who expressed understanding and agree(s) with the plan. Discharge instructions discussed at great length. The patient was given strict return precautions who verbalized understanding of the instructions. No further questions at time of discharge.    ED Discharge Orders         Ordered    oxyCODONE-acetaminophen (PERCOCET) 5-325 MG tablet  Every 8 hours PRN     07/04/18 0057    acetaminophen (TYLENOL) 500 MG tablet   Every 8 hours     07/04/18 0057           Follow Up: Mcarthur Rossetti, MD [place] [address] [phone]  Schedule an appointment as soon as possible for a visit  For close follow up to assess for arm/shoulder fracture      This chart was dictated using voice recognition software.  Despite best efforts to proofread,  errors can occur which can change the documentation meaning.   Fatima Blank, MD 07/04/18 0100

## 2018-07-04 NOTE — Telephone Encounter (Signed)
Called patient and asked the screening questions.  Do you have now or have you had in the past 7 days a fever and/or chills? NO  Do you have now or have you had in the past 7 days a cough? NO  Do you have now or have you had in the last 7 days nausea, vomiting or abdominal pain? NO  Have you been exposed to anyone who has tested positive for COVID-19? NO  Have you or anyone who lives with you traveled within the last month? NO 

## 2018-07-07 ENCOUNTER — Other Ambulatory Visit: Payer: Self-pay

## 2018-07-07 ENCOUNTER — Encounter (INDEPENDENT_AMBULATORY_CARE_PROVIDER_SITE_OTHER): Payer: Self-pay | Admitting: Orthopaedic Surgery

## 2018-07-07 ENCOUNTER — Ambulatory Visit (INDEPENDENT_AMBULATORY_CARE_PROVIDER_SITE_OTHER): Payer: Medicare Other | Admitting: Orthopaedic Surgery

## 2018-07-07 ENCOUNTER — Ambulatory Visit (INDEPENDENT_AMBULATORY_CARE_PROVIDER_SITE_OTHER): Payer: Medicare Other

## 2018-07-07 DIAGNOSIS — S42211A Unspecified displaced fracture of surgical neck of right humerus, initial encounter for closed fracture: Secondary | ICD-10-CM | POA: Diagnosis not present

## 2018-07-07 DIAGNOSIS — S42291A Other displaced fracture of upper end of right humerus, initial encounter for closed fracture: Secondary | ICD-10-CM

## 2018-07-07 NOTE — Progress Notes (Signed)
Office Visit Note   Patient: Erin Mckee           Date of Birth: [birth year]           MRN: [id number] Visit Date: 07/07/2018              Requested by: [name], MD [address] [place], [place] [address] PCP: [name], MD   Assessment & Plan: Visit Diagnoses:  1. Closed displaced fracture of surgical neck of right humerus, unspecified fracture morphology, initial encounter   2. Closed 3-part fracture of proximal humerus, right, initial encounter     Plan: We talked in detail about options of nonoperative versus surgical options to treat her complex proximal humerus fracture of the right shoulder.  For now given the coronavirus pandemic she would rather not go to the hospital and have surgery.  I think this is reasonable as well.  We will see her back in 1 week and at that visit I would like internal and external rotated views of the right shoulder as well as a scapular Y view.  All questions concerns were answered and addressed.  She can come out of her sling to work on pendulum exercises if needed.  Follow-Up Instructions: Return in about 1 week (around 07/14/2018).   Orders:  Orders Placed This Encounter  Procedures  . XR Shoulder 1V Right   No orders of the defined types were placed in this encounter.     Procedures: No procedures performed   Clinical Data: No additional findings.   Subjective: Chief Complaint  Patient presents with  . Right Shoulder - Fracture  Patient is a very pleasant 73 year old former patient of mine who last week sustained a mechanical fall on her dominant right upper extremity.  She went to the emergency room is found to have a complex proximal humerus fracture.  The humeral heads are located but it was at least a three-part to four-part proximal humerus fracture.  She was placed appropriate in a sling.  She was given oxycodone but says she is only taken about 3 pills and that the pain is there but is not too bad for her.   She has been compliant with wearing her sling.  She is right-handed.  We fixed her right wrist 6 years ago.  She denies any active medical issues.  She is not a diabetic and not a smoker.  HPI  Review of Systems She currently denies any headache, chest pain, shortness of breath, fever, chills, nausea, vomiting  Objective: Vital Signs: There were no vitals taken for this visit.  Physical Exam She is alert and orient x3 and in no acute distress Ortho Exam Examination of her right shoulder shows significant bruising.  Clinically is well located.  Distally motor and sensory exam is normal.  I did not put her through any type of range of motion of the right shoulder. Specialty Comments:  No specialty comments available.  Imaging: Xr Shoulder 1v Right  Result Date: 07/07/2018 A single AP view of the right shoulder shows a displaced 2 to 3 part proximal humerus fracture at the surgical neck.  There is been more displacement when compared to films from last week.    PMFS History: Patient Active Problem List   Diagnosis Date Noted  . Distal radius fracture, right 02/14/2012  . Hyperlipidemia 09/13/2011  . HYPERTENSION 01/15/2008  . 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 DYSPNEA 01/15/2008  . PALPITATIONS, HX OF 01/15/2008   Past  Medical History:  Diagnosis Date  . GERD (gastroesophageal reflux disease)   . Heart murmur    resolved- related to stress of throat closing , echo  done 2009  . Hepatitis    hep- a as a child  . Hyperlipidemia   . Hypertension    PCP manages htn, pt. also reports that she has seen Cardiology  at [facility] too.   Marland Kitchen Neuromuscular disorder (HCC)    spasmodic dysphonia   . 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 Hypertension Mother   . Heart disease Mother 59       CAD/CABG  . Hyperlipidemia Mother   . Hypertension Father   . Heart disease Father        AMI  . Hyperlipidemia Father   .  Multiple myeloma Father   . Hypertension Sister   . Thyroid disease Sister   . Hypertension Brother   . Crohn's disease Brother   . Cerebral palsy Son   . Hydrocephalus Son   . Colon cancer Neg Hx     Past Surgical History:  Procedure Laterality Date  . FRACTURE SURGERY    . OPEN REDUCTION INTERNAL FIXATION (ORIF) DISTAL RADIAL FRACTURE  02/14/2012   Procedure: OPEN REDUCTION INTERNAL FIXATION (ORIF) DISTAL RADIAL FRACTURE;  Surgeon: Mcarthur Rossetti, MD;  Location: [place];  Service: Orthopedics;  Laterality: Right;  Open Reduction Internal Fixation Right Wrist  . TONSILLECTOMY     2009   Social History   Occupational History  . Occupation: Software engineer    Comment: retired 2016; now PRN    Employer: [employer]  Tobacco Use  . Smoking status: Never Smoker  . Smokeless tobacco: Never Used  Substance and Sexual Activity  . Alcohol use: Yes    Alcohol/week: 6.0 standard drinks    Types: 6 Glasses of wine per week    Comment: Occasional  . Drug use: No  . Sexual activity: Yes    Birth control/protection: Post-menopausal

## 2018-07-10 ENCOUNTER — Telehealth (INDEPENDENT_AMBULATORY_CARE_PROVIDER_SITE_OTHER): Payer: Self-pay

## 2018-07-10 NOTE — Telephone Encounter (Signed)
Talked with patient and confirmed appointment for Monday, 07/14/2018.  Patient answered NO to all COVID-19 screening questions.

## 2018-07-14 ENCOUNTER — Ambulatory Visit (INDEPENDENT_AMBULATORY_CARE_PROVIDER_SITE_OTHER): Payer: Medicare Other | Admitting: Orthopaedic Surgery

## 2018-07-14 ENCOUNTER — Other Ambulatory Visit (INDEPENDENT_AMBULATORY_CARE_PROVIDER_SITE_OTHER): Payer: Self-pay

## 2018-07-14 ENCOUNTER — Other Ambulatory Visit: Payer: Self-pay

## 2018-07-14 ENCOUNTER — Ambulatory Visit (INDEPENDENT_AMBULATORY_CARE_PROVIDER_SITE_OTHER): Payer: Medicare Other

## 2018-07-14 ENCOUNTER — Encounter (INDEPENDENT_AMBULATORY_CARE_PROVIDER_SITE_OTHER): Payer: Self-pay | Admitting: Orthopaedic Surgery

## 2018-07-14 DIAGNOSIS — S42211D Unspecified displaced fracture of surgical neck of right humerus, subsequent encounter for fracture with routine healing: Secondary | ICD-10-CM | POA: Diagnosis not present

## 2018-07-14 NOTE — Progress Notes (Signed)
The patient is a very active right-hand-dominant 73 year old female who is following up after having sustained a comminuted right proximal humerus fracture.  We have her in a sling we want to see her back today 1 week later for repeat x-rays to assess the alignment.  She still having some swelling and bruising but denies any numbness and tingling and it is uncomfortable for her.  Again she is someone who is highly active.  On exam her shoulder is well located but clinically for some bruising and deformity.  3 views of the right shoulder which include internal and external carotid views and scapular Y view show a displaced comminuted proximal humerus fracture.  Given her high level of function activity a CT scan is warranted to assess the fracture fragments and displacement to better ascertain whether or not she would benefit from open reduction-internal fixation versus a shoulder arthroplasty.  We need to get the CT scan done in the next day or 2 so we can then get her back to the office to discuss this further and potentially make plans for an operative intervention given the severity of this fracture.  All question concerns were answered and addressed.

## 2018-07-15 ENCOUNTER — Telehealth (INDEPENDENT_AMBULATORY_CARE_PROVIDER_SITE_OTHER): Payer: Self-pay | Admitting: *Deleted

## 2018-07-15 ENCOUNTER — Ambulatory Visit
Admission: RE | Admit: 2018-07-15 | Discharge: 2018-07-15 | Disposition: A | Discharge status: Home / Self Care | Payer: Medicare Other | Source: Ambulatory Visit | Attending: Orthopaedic Surgery | Admitting: Orthopaedic Surgery

## 2018-07-15 DIAGNOSIS — S42211D Unspecified displaced fracture of surgical neck of right humerus, subsequent encounter for fracture with routine healing: Secondary | ICD-10-CM

## 2018-07-15 NOTE — Telephone Encounter (Signed)
Pt has follow up appt with CB on 07/17/18, pt was screened for the COVID 19 and has answered "NO" to all questions

## 2018-07-17 ENCOUNTER — Ambulatory Visit (INDEPENDENT_AMBULATORY_CARE_PROVIDER_SITE_OTHER): Payer: Medicare Other | Admitting: Orthopedic Surgery

## 2018-07-17 ENCOUNTER — Encounter (INDEPENDENT_AMBULATORY_CARE_PROVIDER_SITE_OTHER): Payer: Self-pay | Admitting: Orthopedic Surgery

## 2018-07-17 DIAGNOSIS — S42211D Unspecified displaced fracture of surgical neck of right humerus, subsequent encounter for fracture with routine healing: Secondary | ICD-10-CM

## 2018-07-17 MED ORDER — OXYCODONE-ACETAMINOPHEN 5-325 MG PO TABS: ORAL_TABLET | ORAL | 0 refills | Status: DC

## 2018-07-18 ENCOUNTER — Encounter (INDEPENDENT_AMBULATORY_CARE_PROVIDER_SITE_OTHER): Payer: Self-pay | Admitting: Orthopedic Surgery

## 2018-07-18 NOTE — Progress Notes (Signed)
Post-Op Visit Note   Patient: Erin Mckee           Date of Birth: 1946/03/10           MRN: [id number] Visit Date: 07/17/2018 PCP: [name], MD   Assessment & Plan:  Chief Complaint:  Chief Complaint  Patient presents with   Right Shoulder - Follow-up   Visit Diagnoses:  1. Closed displaced fracture of surgical neck of right humerus with routine healing, unspecified fracture morphology, subsequent encounter     Plan: [name] is a 73 year old patient with right shoulder pain.  She has a three-part proximal humerus fracture.  Also has a diagnosis of osteopenia.  Has been is at home.  Tries to ride horses.  Since have seen her she had a CT scan which is reviewed.  The CT scan shows a little bit of varus collapse of the head.  There is still maintenance of the acromiohumeral distance with only about 1 to 2 mm of proximal migration of the greater tuberosity.  The shaft is displaced anterior relative to the head which is located.  All in all there is displacement of the fracture but general alignment is potentially functional if it heals in this position.  Discussed with her at length the risks and benefits of operative and nonoperative treatment options.  In general operative treatment would be open reduction and improvement of the alignment with plate fixation.  That would potentially jeopardize the blood supply to the humeral head which would lead to avascular necrosis and necessitate further surgery.  Alternatively we can let this heal as it is and she really does have limited goals with that arm just in terms of being the abduction at 90 degrees.  If that fails then reverse shoulder replacement would be an option but I would not jump right into that at this time.  Primary decision point today was for or against plate fixation versus observation.  I think based on our conversation she would like to proceed with observation.  Her pain is improved.  I think some of the prominence  that she has anteriorly will diminish but we will still be asymmetric compared to the left-hand side.  Refill Percocet today and I will see her back in 2 weeks for clinical recheck.  Follow-Up Instructions: Return in about 2 weeks (around 07/31/2018).   Orders:  No orders of the defined types were placed in this encounter.  Meds ordered this encounter  Medications   oxyCODONE-acetaminophen (PERCOCET/ROXICET) 5-325 MG tablet    Sig: 1 po bid prn pain    Dispense:  40 tablet    Refill:  0    Imaging: No results found.  PMFS History: Patient Active Problem List   Diagnosis Date Noted   Distal radius fracture, right 02/14/2012   Hyperlipidemia 09/13/2011   HYPERTENSION 01/15/2008   VOCAL CORD DISORDER 01/15/2008   DYSPNEA 01/15/2008   PALPITATIONS, HX OF 01/15/2008   Past Medical History:  Diagnosis Date   GERD (gastroesophageal reflux disease)    Heart murmur    resolved- related to stress of throat closing , echo  done 2009   Hepatitis    hep- a as a child   Hyperlipidemia    Hypertension    PCP manages htn, pt. also reports that she has seen Cardiology  at [facility] too.    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  Family History  Problem Relation Age of Onset   Hypertension Mother    Heart disease Mother 83       CAD/CABG   Hyperlipidemia Mother    Hypertension Father    Heart disease Father        AMI   Hyperlipidemia Father    Multiple myeloma Father    Hypertension Sister    Thyroid disease Sister    Hypertension Brother    Crohn's disease Brother    Cerebral palsy Son    Hydrocephalus Son    Colon cancer Neg Hx     Past Surgical History:  Procedure Laterality Date   FRACTURE SURGERY     OPEN REDUCTION INTERNAL FIXATION (ORIF) DISTAL RADIAL FRACTURE  02/14/2012   Procedure: OPEN REDUCTION INTERNAL FIXATION (ORIF) DISTAL RADIAL FRACTURE;  Surgeon:  Mcarthur Rossetti, MD;  Location: [place];  Service: Orthopedics;  Laterality: Right;  Open Reduction Internal Fixation Right Wrist   TONSILLECTOMY     2009   Social History   Occupational History   Occupation: pharmacist    Comment: retired 2016; now PRN    Employer: [employer]  Tobacco Use   Smoking status: Never Smoker   Smokeless tobacco: Never Used  Substance and Sexual Activity   Alcohol use: Yes    Alcohol/week: 6.0 standard drinks    Types: 6 Glasses of wine per week    Comment: Occasional   Drug use: No   Sexual activity: Yes    Birth control/protection: Post-menopausal

## 2018-07-31 ENCOUNTER — Ambulatory Visit (INDEPENDENT_AMBULATORY_CARE_PROVIDER_SITE_OTHER): Payer: Medicare Other

## 2018-07-31 ENCOUNTER — Other Ambulatory Visit: Payer: Self-pay

## 2018-07-31 ENCOUNTER — Ambulatory Visit (INDEPENDENT_AMBULATORY_CARE_PROVIDER_SITE_OTHER): Payer: Medicare Other | Admitting: Orthopedic Surgery

## 2018-07-31 ENCOUNTER — Encounter (INDEPENDENT_AMBULATORY_CARE_PROVIDER_SITE_OTHER): Payer: Self-pay | Admitting: Orthopedic Surgery

## 2018-07-31 DIAGNOSIS — S42211D Unspecified displaced fracture of surgical neck of right humerus, subsequent encounter for fracture with routine healing: Secondary | ICD-10-CM | POA: Diagnosis not present

## 2018-07-31 NOTE — Progress Notes (Signed)
Post-Op Visit Note   Patient: Erin Mckee           Date of Birth: [birth year]           MRN: [id number] Visit Date: 07/31/2018 PCP: Erin Agreste, MD   Assessment & Plan:  Chief Complaint:  Chief Complaint  Patient presents with  . Right Shoulder - Follow-up   Visit Diagnoses:  1. Closed displaced fracture of surgical neck of right humerus with routine healing, unspecified fracture morphology, subsequent encounter     Plan: Erin Mckee is a patient is now about a month out from right proximal humerus fracture.  She is taking Percocet as needed usually at night.  She discontinued the sling a week ago.  On exam she has functional union of that fracture.  No grinding with rotation.  Motor or sensory function to the hand is intact.  Radiographs show malunited proximal humerus fracture but the head remains located.  Plan at this time is to start physical therapy for passive range of motion and active assisted range of motion no lifting with that right arm.  Follow-up in 4 weeks for clinical recheck.  Follow-Up Instructions: Return in about 4 weeks (around 08/28/2018).   Orders:  Orders Placed This Encounter  Procedures  . XR Shoulder Right  . Ambulatory referral to Physical Therapy   No orders of the defined types were placed in this encounter.   Imaging: Xr Shoulder Right  Result Date: 07/31/2018 AP lateral outlet right shoulder reviewed.  Patient does have some callus formation present around healing proximal humerus fracture.  The shaft does remain anterior to the head.  The head is located.   PMFS History: Patient Active Problem List   Diagnosis Date Noted  . Distal radius fracture, right 02/14/2012  . Hyperlipidemia 09/13/2011  . HYPERTENSION 01/15/2008  . 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 DYSPNEA 01/15/2008  . PALPITATIONS, HX OF 01/15/2008   Past Medical History:  Diagnosis Date  . GERD (gastroesophageal reflux disease)   . Heart murmur    resolved- related  to stress of throat closing , echo  done 2009  . Hepatitis    hep- a as a child  . Hyperlipidemia   . Hypertension    PCP manages htn, pt. also reports that she has seen Cardiology  at [facility] too.   Marland Kitchen Neuromuscular disorder (HCC)    spasmodic dysphonia   . 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 Hypertension Mother   . Heart disease Mother 73       CAD/CABG  . Hyperlipidemia Mother   . Hypertension Father   . Heart disease Father        AMI  . Hyperlipidemia Father   . Multiple myeloma Father   . Hypertension Sister   . Thyroid disease Sister   . Hypertension Brother   . Crohn's disease Brother   . Cerebral palsy Son   . Hydrocephalus Son   . Colon cancer Neg Hx     Past Surgical History:  Procedure Laterality Date  . FRACTURE SURGERY    . OPEN REDUCTION INTERNAL FIXATION (ORIF) DISTAL RADIAL FRACTURE  02/14/2012   Procedure: OPEN REDUCTION INTERNAL FIXATION (ORIF) DISTAL RADIAL FRACTURE;  Surgeon: Mcarthur Rossetti, MD;  Location: [place];  Service: Orthopedics;  Laterality: Right;  Open Reduction Internal Fixation Right Wrist  . TONSILLECTOMY     2009  Social History   Occupational History  . Occupation: Software engineer    Comment: retired 2016; now PRN    Employer: [employer]  Tobacco Use  . Smoking status: Never Smoker  . Smokeless tobacco: Never Used  Substance and Sexual Activity  . Alcohol use: Yes    Alcohol/week: 6.0 standard drinks    Types: 6 Glasses of wine per week    Comment: Occasional  . Drug use: No  . Sexual activity: Yes    Birth control/protection: Post-menopausal

## 2018-08-12 DIAGNOSIS — M79601 Pain in right arm: Secondary | ICD-10-CM | POA: Diagnosis not present

## 2018-08-14 DIAGNOSIS — M79601 Pain in right arm: Secondary | ICD-10-CM | POA: Diagnosis not present

## 2018-08-14 DIAGNOSIS — S42201D Unspecified fracture of upper end of right humerus, subsequent encounter for fracture with routine healing: Secondary | ICD-10-CM | POA: Diagnosis not present

## 2018-08-19 DIAGNOSIS — M79601 Pain in right arm: Secondary | ICD-10-CM | POA: Diagnosis not present

## 2018-08-19 DIAGNOSIS — S42201D Unspecified fracture of upper end of right humerus, subsequent encounter for fracture with routine healing: Secondary | ICD-10-CM | POA: Diagnosis not present

## 2018-08-21 DIAGNOSIS — S42201D Unspecified fracture of upper end of right humerus, subsequent encounter for fracture with routine healing: Secondary | ICD-10-CM | POA: Diagnosis not present

## 2018-08-21 DIAGNOSIS — M79601 Pain in right arm: Secondary | ICD-10-CM | POA: Diagnosis not present

## 2018-08-28 ENCOUNTER — Ambulatory Visit: Payer: Medicare Other | Admitting: Orthopedic Surgery

## 2018-08-28 DIAGNOSIS — M79601 Pain in right arm: Secondary | ICD-10-CM | POA: Diagnosis not present

## 2018-08-28 DIAGNOSIS — S42201D Unspecified fracture of upper end of right humerus, subsequent encounter for fracture with routine healing: Secondary | ICD-10-CM | POA: Diagnosis not present

## 2018-09-03 ENCOUNTER — Ambulatory Visit (INDEPENDENT_AMBULATORY_CARE_PROVIDER_SITE_OTHER): Payer: Medicare Other | Admitting: Orthopedic Surgery

## 2018-09-03 ENCOUNTER — Encounter: Payer: Self-pay | Admitting: Orthopedic Surgery

## 2018-09-03 ENCOUNTER — Other Ambulatory Visit: Payer: Self-pay

## 2018-09-03 DIAGNOSIS — S42211D Unspecified displaced fracture of surgical neck of right humerus, subsequent encounter for fracture with routine healing: Secondary | ICD-10-CM

## 2018-09-04 ENCOUNTER — Encounter: Payer: Self-pay | Admitting: Orthopedic Surgery

## 2018-09-04 DIAGNOSIS — S42201D Unspecified fracture of upper end of right humerus, subsequent encounter for fracture with routine healing: Secondary | ICD-10-CM | POA: Diagnosis not present

## 2018-09-04 DIAGNOSIS — M79601 Pain in right arm: Secondary | ICD-10-CM | POA: Diagnosis not present

## 2018-09-04 NOTE — Progress Notes (Signed)
Post-Op Visit Note   Patient: Erin Mckee           Date of Birth: 02-May-1945           MRN: [id number] Visit Date: 09/03/2018 PCP: Erin Agreste, MD   Assessment & Plan:  Chief Complaint:  Chief Complaint  Patient presents with  . Right Shoulder - Follow-up   Visit Diagnoses:  1. Closed displaced fracture of surgical neck of right humerus with routine healing, unspecified fracture morphology, subsequent encounter     Plan: Erin Mckee is a patient with right proximal humerus fracture.  Since have seen her is been 4 weeks.  She has been doing well.  She has been in physical therapy.  She is able to wash her hair.  She is able to get her hand to the top of her head.  On exam she has forward flexion abduction both about 90 degrees.  Rotator cuff strength is good.  She has some occasional popping and grinding but nothing that I would expect from her posttraumatic deformity.  All in all she is doing exceptionally well considering her fracture.  She is functional and relatively pain-free.  Surgery not indicated.  I think she will continue to do well and I will see her back as needed  Follow-Up Instructions: Return if symptoms worsen or fail to improve.   Orders:  No orders of the defined types were placed in this encounter.  No orders of the defined types were placed in this encounter.   Imaging: No results found.  PMFS History: Patient Active Problem List   Diagnosis Date Noted  . Distal radius fracture, right 02/14/2012  . Hyperlipidemia 09/13/2011  . HYPERTENSION 01/15/2008  . 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 DYSPNEA 01/15/2008  . PALPITATIONS, HX OF 01/15/2008   Past Medical History:  Diagnosis Date  . GERD (gastroesophageal reflux disease)   . Heart murmur    resolved- related to stress of throat closing , echo  done 2009  . Hepatitis    hep- a as a child  . Hyperlipidemia   . Hypertension    PCP manages htn, pt. also reports that she has seen Cardiology  at  [facility] too.   Marland Kitchen Neuromuscular disorder (HCC)    spasmodic dysphonia   . 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 Hypertension Mother   . Heart disease Mother 70       CAD/CABG  . Hyperlipidemia Mother   . Hypertension Father   . Heart disease Father        AMI  . Hyperlipidemia Father   . Multiple myeloma Father   . Hypertension Sister   . Thyroid disease Sister   . Hypertension Brother   . Crohn's disease Brother   . Cerebral palsy Son   . Hydrocephalus Son   . Colon cancer Neg Hx     Past Surgical History:  Procedure Laterality Date  . FRACTURE SURGERY    . OPEN REDUCTION INTERNAL FIXATION (ORIF) DISTAL RADIAL FRACTURE  02/14/2012   Procedure: OPEN REDUCTION INTERNAL FIXATION (ORIF) DISTAL RADIAL FRACTURE;  Surgeon: Mcarthur Rossetti, MD;  Location: [place];  Service: Orthopedics;  Laterality: Right;  Open Reduction Internal Fixation Right Wrist  . TONSILLECTOMY     2009   Social History   Occupational History  . Occupation: Software engineer    Comment: retired 2016; now PRN  Employer: [employer]  Tobacco Use  . Smoking status: Never Smoker  . Smokeless tobacco: Never Used  Substance and Sexual Activity  . Alcohol use: Yes    Alcohol/week: 6.0 standard drinks    Types: 6 Glasses of wine per week    Comment: Occasional  . Drug use: No  . Sexual activity: Yes    Birth control/protection: Post-menopausal

## 2018-09-09 ENCOUNTER — Encounter: Payer: Self-pay | Admitting: Family Medicine

## 2018-09-09 ENCOUNTER — Telehealth: Payer: Self-pay | Admitting: *Deleted

## 2018-09-09 ENCOUNTER — Other Ambulatory Visit: Payer: Self-pay

## 2018-09-09 ENCOUNTER — Ambulatory Visit (INDEPENDENT_AMBULATORY_CARE_PROVIDER_SITE_OTHER): Payer: Medicare Other | Admitting: Family Medicine

## 2018-09-09 VITALS — BP 134/76 | HR 63 | Temp 98.6°F | Resp 14 | Ht 64.0 in | Wt 167.8 lb

## 2018-09-09 DIAGNOSIS — Z23 Encounter for immunization: Secondary | ICD-10-CM

## 2018-09-09 DIAGNOSIS — I10 Essential (primary) hypertension: Secondary | ICD-10-CM

## 2018-09-09 DIAGNOSIS — J383 Other diseases of vocal cords: Secondary | ICD-10-CM | POA: Diagnosis not present

## 2018-09-09 DIAGNOSIS — R490 Dysphonia: Secondary | ICD-10-CM

## 2018-09-09 DIAGNOSIS — E785 Hyperlipidemia, unspecified: Secondary | ICD-10-CM

## 2018-09-09 MED ORDER — LORAZEPAM 1 MG PO TABS: 1.0000 mg | ORAL_TABLET | Freq: Two times a day (BID) | ORAL | 1 refills | Status: DC | PRN

## 2018-09-09 MED ORDER — HYDROCHLOROTHIAZIDE 25 MG PO TABS: 25.0000 mg | ORAL_TABLET | Freq: Every day | ORAL | 3 refills | Status: DC

## 2018-09-09 MED ORDER — METOPROLOL SUCCINATE ER 50 MG PO TB24: 50.0000 mg | ORAL_TABLET | Freq: Every day | ORAL | 3 refills | Status: DC

## 2018-09-09 NOTE — Telephone Encounter (Signed)
09/09/2018 - PATIENT HAD AN OFFICE VISIT WITH DR. Carlota Raspberry ON Tuesday (09/09/2018). HE HAS REQUESTED SHE HAVE A MEDICARE WELLNESS VISIT IN 1 YEAR. I WILL SEND THIS TELEPHONE MESSAGE TO JULIE TO SCHEDULE.  BEST PHONE [phone] (HOME) [place]

## 2018-09-09 NOTE — Patient Instructions (Addendum)
No medication changes for now.  I will check your electrolytes as well as cholesterol again, and typically would recommend statin given prior levels but can evaluate those again.  We will let you know the results once I have those available.  Follow-up in 1 year for annual exam.  Let me know if there are questions in the meantime.    If you have lab work done today you will be contacted with your lab results within the next 2 weeks.  If you have not heard from [place] then please contact us. The fastest way to get your results is to register for My Chart.   IF you received an x-ray today, you will receive an invoice from [facility] Radiology. Please contact [facility] Radiology at [phone] with questions or concerns regarding your invoice.   IF you received labwork today, you will receive an invoice from [place]. Please contact [facility] at [phone] with questions or concerns regarding your invoice.   Our billing staff will not be able to assist you with questions regarding bills from these companies.  You will be contacted with the lab results as soon as they are available. The fastest way to get your results is to activate your My Chart account. Instructions are located on the last page of this paperwork. If you have not heard from [place] regarding the results in 2 weeks, please contact this office.

## 2018-09-09 NOTE — Progress Notes (Signed)
Subjective:    Patient ID: Erin Mckee, female    DOB: 09-Feb-1946, 73 y.o.   MRN: [id number]  HPI Erin Mckee is a 73 y.o. female Presents today for: Chief Complaint  Patient presents with  . Hypertension    need a refill on hctz, and metoprolol. Patient also would like to have a td shot she was informed ins will not pay and she willing to pay for it  . Bronchial spasms    Need a refill on the lorazepam for bronchial spasms   Hypertension: BP Readings from Last 3 Encounters:  09/09/18 134/76  07/04/18 130/80  04/29/18 140/72   Lab Results  Component Value Date   CREATININE 0.84 09/12/2017  Has been well managed with HCTZ 25 mg daily, Toprol 50 mg daily.  Home readings infrequently. Usually in 130/60-70's  Hyperlipidemia:  Lab Results  Component Value Date   CHOL 254 (H) 09/12/2017   HDL 64 09/12/2017   LDLCALC 157 (H) 09/12/2017   TRIG 164 (H) 09/12/2017   CHOLHDL 4.0 09/12/2017   Lab Results  Component Value Date   ALT 22 09/12/2017   AST 21 09/12/2017   ALKPHOS 84 09/12/2017   BILITOT 0.5 09/12/2017  The 10-year ASCVD risk score Mikey Bussing DC Jr., et al., 2013) is: 19.2%   Values used to calculate the score:     Age: 48 years     Sex: Female     Is Non-Hispanic African American: No     Diabetic: No     Tobacco smoker: No     Systolic Blood Pressure: 585 mmHg     Is BP treated: Yes     HDL Cholesterol: 64 mg/dL     Total Cholesterol: 254 mg/dL Discussed statins as well as her ASCVD risk for the past and she has declined starting that medication. Still wants to avoid statin. Still would like to try diet/exercise approach. Would like to check numbers today. Fasting today.   Vocal cord dysfunction/dysphonia: See previous visits.  Has been treated by [facility] speech pathology and speech therapist in [place].  Has been overall managed with Ativan about twice per week which calms down vocal cord spasms.rare use, but 1/2 pill if having to work. Few days in  a row, then not for a few weeks   Controlled substance database (PDMP) reviewed. No concerns appreciated.  Lorazepam 1 mg #60 last filled December 13, 2017.  2 prescriptions for oxycodone from orthopedics most recently April 16 from fracture.   Review of Systems  Constitutional: Negative for fatigue and unexpected weight change.  Respiratory: Negative for chest tightness and shortness of breath.   Cardiovascular: Negative for chest pain, palpitations and leg swelling.  Gastrointestinal: Negative for abdominal pain and blood in stool.  Neurological: Negative for dizziness, syncope, light-headedness and headaches.       Objective:   Physical Exam Vitals signs reviewed.  Constitutional:      Appearance: She is well-developed.  HENT:     Head: Normocephalic and atraumatic.     Mouth/Throat:     Comments: Clear voice, vocalizing normally.  Eyes:     Conjunctiva/sclera: Conjunctivae normal.     Pupils: Pupils are equal, round, and reactive to light.  Neck:     Vascular: No carotid bruit.  Cardiovascular:     Rate and Rhythm: Normal rate and regular rhythm.     Heart sounds: Normal heart sounds.  Pulmonary:     Effort: Pulmonary effort is normal.  Breath sounds: Normal breath sounds.  Abdominal:     Palpations: Abdomen is soft. There is no pulsatile mass.     Tenderness: There is no abdominal tenderness.  Skin:    General: Skin is warm and dry.  Neurological:     Mental Status: She is alert and oriented to person, place, and time.  Psychiatric:        Behavior: Behavior normal.    Vitals:   09/09/18 0901 09/09/18 0902  BP: (!) 148/81 134/76  Pulse: 63   Resp: 14   Temp: 98.6 F (37 C)   TempSrc: Oral   SpO2: 96%   Weight: 167 lb 12.8 oz (76.1 kg)   Height: 5\' 4"  (1.626 m)         Assessment & Plan:   Erin Mckee is a 73 y.o. female Essential hypertension - Plan: Comprehensive metabolic panel, Lipid panel, hydrochlorothiazide (HYDRODIURIL) 25 MG  tablet, metoprolol succinate (TOPROL-XL) 50 MG 24 hr tablet  -  Stable, tolerating current regimen. Medications refilled. Labs pending as above.   Need for prophylactic vaccination against diphtheria and tetanus - Plan: Td vaccine greater than or equal to 7yo preservative free IM  Dysphonia - Plan: LORazepam (ATIVAN) 1 MG tablet Vocal cord dysfunction - Plan: LORazepam (ATIVAN) 1 MG tablet  -Improved.  Episodic use of Ativan.  Controlled substance registry reviewed as above without concerns, cautioned on combo with narcotics if needed in the future.  Not using narcotic pain medication.  Recheck in 1 year, sooner if worsening symptoms  Hyperlipidemia, unspecified hyperlipidemia type - Plan: Lipid panel  -Elevated ASCVD risk discussed previously as well as recommendation for statin, but she would like to avoid that at this time and work on diet/exercise approach.  CVD/CAD risk with elevated cholesterol discussed.  Meds ordered this encounter  Medications  . LORazepam (ATIVAN) 1 MG tablet    Sig: Take 1 tablet (1 mg total) by mouth 2 (two) times daily as needed. For throat spasms    Dispense:  60 tablet    Refill:  1  . hydrochlorothiazide (HYDRODIURIL) 25 MG tablet    Sig: Take 1 tablet (25 mg total) by mouth daily.    Dispense:  90 tablet    Refill:  3  . metoprolol succinate (TOPROL-XL) 50 MG 24 hr tablet    Sig: Take 1 tablet (50 mg total) by mouth daily.    Dispense:  90 tablet    Refill:  3   Patient Instructions   No medication changes for now.  I will check your electrolytes as well as cholesterol again, and typically would recommend statin given prior levels but can evaluate those again.  We will let you know the results once I have those available.  Follow-up in 1 year for annual exam.  Let me know if there are questions in the meantime.    If you have lab work done today you will be contacted with your lab results within the next 2 weeks.  If you have not heard from [place] then  please contact us. The fastest way to get your results is to register for My Chart.   IF you received an x-ray today, you will receive an invoice from [facility] Radiology. Please contact [facility] Radiology at [phone] with questions or concerns regarding your invoice.   IF you received labwork today, you will receive an invoice from [place]. Please contact [facility] at [phone] with questions or concerns regarding your invoice.   Our billing staff  will not be able to assist you with questions regarding bills from these companies.  You will be contacted with the lab results as soon as they are available. The fastest way to get your results is to activate your My Chart account. Instructions are located on the last page of this paperwork. If you have not heard from [place] regarding the results in 2 weeks, please contact this office.       Signed,   Merri Ray, MD [facility] [place].  09/09/18 9:30 AM

## 2018-09-10 LAB — COMPREHENSIVE METABOLIC PANEL
ALT: 20 IU/L (ref 0–32)
AST: 20 IU/L (ref 0–40)
Albumin/Globulin Ratio: 2.2 (ref 1.2–2.2)
Albumin: 4.7 g/dL (ref 3.7–4.7)
Alkaline Phosphatase: 102 IU/L (ref 39–117)
BUN/Creatinine Ratio: 29 — ABNORMAL HIGH (ref 12–28)
BUN: 20 mg/dL (ref 8–27)
Bilirubin Total: 0.4 mg/dL (ref 0.0–1.2)
CO2: 21 mmol/L (ref 20–29)
Calcium: 9.8 mg/dL (ref 8.7–10.3)
Chloride: 100 mmol/L (ref 96–106)
Creatinine, Ser: 0.7 mg/dL (ref 0.57–1.00)
GFR calc Af Amer: 99 mL/min/{1.73_m2} (ref >59)
GFR calc non Af Amer: 86 mL/min/{1.73_m2} (ref >59)
Globulin, Total: 2.1 g/dL (ref 1.5–4.5)
Glucose: 103 mg/dL — ABNORMAL HIGH (ref 65–99)
Potassium: 3.8 mmol/L (ref 3.5–5.2)
Sodium: 138 mmol/L (ref 134–144)
Total Protein: 6.8 g/dL (ref 6.0–8.5)

## 2018-09-10 LAB — LIPID PANEL
Chol/HDL Ratio: 4.2 ratio (ref 0.0–4.4)
Cholesterol, Total: 249 mg/dL — ABNORMAL HIGH (ref 100–199)
HDL: 59 mg/dL (ref >39)
LDL Calculated: 153 mg/dL — ABNORMAL HIGH (ref 0–99)
Triglycerides: 185 mg/dL — ABNORMAL HIGH (ref 0–149)
VLDL Cholesterol Cal: 37 mg/dL (ref 5–40)

## 2019-05-28 ENCOUNTER — Telehealth: Payer: Self-pay | Admitting: *Deleted

## 2019-06-01 NOTE — Telephone Encounter (Signed)
Scheduled an appointment 

## 2019-06-02 ENCOUNTER — Ambulatory Visit: Payer: Self-pay

## 2019-06-28 ENCOUNTER — Encounter (HOSPITAL_COMMUNITY): Payer: Self-pay | Admitting: Emergency Medicine

## 2019-06-28 ENCOUNTER — Emergency Department (HOSPITAL_COMMUNITY)
Admission: EM | Admit: 2019-06-28 | Discharge: 2019-06-28 | Disposition: A | Discharge status: Home / Self Care | Payer: Medicare Other | Attending: Emergency Medicine | Admitting: Emergency Medicine

## 2019-06-28 ENCOUNTER — Emergency Department (HOSPITAL_COMMUNITY): Payer: Medicare Other

## 2019-06-28 ENCOUNTER — Other Ambulatory Visit: Payer: Self-pay

## 2019-06-28 DIAGNOSIS — Y929 Unspecified place or not applicable: Secondary | ICD-10-CM | POA: Insufficient documentation

## 2019-06-28 DIAGNOSIS — W010XXA Fall on same level from slipping, tripping and stumbling without subsequent striking against object, initial encounter: Secondary | ICD-10-CM | POA: Diagnosis not present

## 2019-06-28 DIAGNOSIS — S99912A Unspecified injury of left ankle, initial encounter: Secondary | ICD-10-CM | POA: Diagnosis present

## 2019-06-28 DIAGNOSIS — Y999 Unspecified external cause status: Secondary | ICD-10-CM | POA: Diagnosis not present

## 2019-06-28 DIAGNOSIS — S82842A Displaced bimalleolar fracture of left lower leg, initial encounter for closed fracture: Secondary | ICD-10-CM | POA: Insufficient documentation

## 2019-06-28 DIAGNOSIS — Z79899 Other long term (current) drug therapy: Secondary | ICD-10-CM | POA: Insufficient documentation

## 2019-06-28 DIAGNOSIS — S8262XA Displaced fracture of lateral malleolus of left fibula, initial encounter for closed fracture: Secondary | ICD-10-CM | POA: Diagnosis not present

## 2019-06-28 DIAGNOSIS — I10 Essential (primary) hypertension: Secondary | ICD-10-CM | POA: Diagnosis not present

## 2019-06-28 DIAGNOSIS — Y939 Activity, unspecified: Secondary | ICD-10-CM | POA: Diagnosis not present

## 2019-06-28 DIAGNOSIS — S8252XA Displaced fracture of medial malleolus of left tibia, initial encounter for closed fracture: Secondary | ICD-10-CM | POA: Diagnosis not present

## 2019-06-28 DIAGNOSIS — S82832A Other fracture of upper and lower end of left fibula, initial encounter for closed fracture: Secondary | ICD-10-CM | POA: Diagnosis not present

## 2019-06-28 MED ORDER — HYDROCODONE-ACETAMINOPHEN 5-325 MG PO TABS: 1.0000 | ORAL_TABLET | Freq: Three times a day (TID) | ORAL | 0 refills | Status: DC | PRN

## 2019-06-28 MED ORDER — ACETAMINOPHEN ER 650 MG PO TBCR: 650.0000 mg | EXTENDED_RELEASE_TABLET | Freq: Three times a day (TID) | ORAL | 0 refills | Status: DC | PRN

## 2019-06-28 NOTE — ED Notes (Signed)
Patient verbalizes understanding of discharge instructions. Opportunity for questioning and answers were provided. Armband removed by staff, pt discharged from ED.  

## 2019-06-28 NOTE — Discharge Instructions (Addendum)
You have a fracture to your ankle. You likely will need surgical care.  Please follow-up with the orthopedic doctors in 7 to 10 days.  Do not bear any weight on that ankle.  Keep the ankle elevated and take the medications prescribed for pain control.

## 2019-06-28 NOTE — ED Notes (Signed)
Ortho paged. 

## 2019-06-28 NOTE — Progress Notes (Signed)
Orthopedic Tech Progress Note Patient Details:  Erin Mckee [birth year] [id number]  Ortho Devices Type of Ortho Device: Ace wrap, Stirrup splint, Post (short leg) splint, Crutches Ortho Device/Splint Location: left Ortho Device/Splint Interventions: Application   Post Interventions Patient Tolerated: Well Instructions Provided: Care of device, Poper ambulation with device   [name] 06/28/2019, 5:49 PM

## 2019-06-28 NOTE — ED Triage Notes (Signed)
Pt states she slipped on a wooden ramp this morning.  C/o swelling to L ankle and foot and redness to foot.  Denies pain.  Reports tingling in toes.

## 2019-06-28 NOTE — ED Provider Notes (Signed)
[facility] EMERGENCY DEPARTMENT Provider Note   CSN: [id number] Arrival date & time: 06/28/19  1359     History Chief Complaint  Patient presents with  . Fall  . ankle injury    Erin Mckee is a 74 y.o. female.  HPI    74 year old female comes in a chief complaint of fall.  She has history of hypertension, hyperlipidemia and is not on any blood thinners. Patient reports that she slipped and fell forward.  In the process she injured her left ankle and she heard a pop.  She was unable to get up.  She denies having pain anywhere else at this time besides her left ankle.  She never struck her head and denies any headache, loss of consciousness, nausea, vomiting, new neurologic symptoms.  Past Medical History:  Diagnosis Date  . GERD (gastroesophageal reflux disease)   . Heart murmur    resolved- related to stress of throat closing , echo  done 2009  . Hepatitis    hep- a as a child  . Hyperlipidemia   . Hypertension    PCP manages htn, pt. also reports that she has seen Cardiology  at [facility] too.   Marland Kitchen Neuromuscular disorder (HCC)    spasmodic dysphonia   . 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 Distal radius fracture, right 02/14/2012  . Hyperlipidemia 09/13/2011  . HYPERTENSION 01/15/2008  . 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 DYSPNEA 01/15/2008  . PALPITATIONS, HX OF 01/15/2008    Past Surgical History:  Procedure Laterality Date  . FRACTURE SURGERY    . OPEN REDUCTION INTERNAL FIXATION (ORIF) DISTAL RADIAL FRACTURE  02/14/2012   Procedure: OPEN REDUCTION INTERNAL FIXATION (ORIF) DISTAL RADIAL FRACTURE;  Surgeon: Mcarthur Rossetti, MD;  Location: [facility];  Service: Orthopedics;  Laterality: Right;  Open Reduction Internal Fixation Right Wrist  . TONSILLECTOMY     2009     OB History   No obstetric history on file.     Family History  Problem Relation Age of  Onset  . Hypertension Mother   . Heart disease Mother 62       CAD/CABG  . Hyperlipidemia Mother   . Hypertension Father   . Heart disease Father        AMI  . Hyperlipidemia Father   . Multiple myeloma Father   . Hypertension Sister   . Thyroid disease Sister   . Hypertension Brother   . Crohn's disease Brother   . Cerebral palsy Son   . Hydrocephalus Son   . Colon cancer Neg Hx     Social History   Tobacco Use  . Smoking status: Never Smoker  . Smokeless tobacco: Never Used  Substance Use Topics  . Alcohol use: Yes    Alcohol/week: 6.0 standard drinks    Types: 6 Glasses of wine per week    Comment: Occasional  . Drug use: No    Home Medications Prior to Admission medications   Medication Sig Start Date End Date Taking? Authorizing Provider  acetaminophen (TYLENOL 8 HOUR) 650 MG CR tablet Take 1 tablet (650 mg total) by mouth every 8 (eight) hours as needed for pain or fever. 06/28/19   Varney Biles, MD  calcium-vitamin D 250-100 MG-UNIT tablet Take 1 tablet by mouth 2 (two) times daily.    [provider]  hydrochlorothiazide (HYDRODIURIL) 25 MG tablet Take 1  tablet (25 mg total) by mouth daily. 09/09/18   [name], MD  HYDROcodone-acetaminophen (NORCO/VICODIN) 5-325 MG tablet Take 1 tablet by mouth every 8 (eight) hours as needed for up to 3 days for severe pain. 06/28/19 07/01/19  Varney Biles, MD  LORazepam (ATIVAN) 1 MG tablet Take 1 tablet (1 mg total) by mouth 2 (two) times daily as needed. For throat spasms 09/09/18   [name], MD  metoprolol succinate (TOPROL-XL) 50 MG 24 hr tablet Take 1 tablet (50 mg total) by mouth daily. 09/09/18   [name], MD  OVER THE COUNTER MEDICATION Biotears 2 caps daily for dry eyes    [provider]    Allergies    Patient has no known allergies.  Review of Systems   Review of Systems  Constitutional: Positive for activity change.  Gastrointestinal: Negative for nausea and vomiting.    Musculoskeletal: Positive for arthralgias.  Neurological: Negative for seizures, syncope, weakness, numbness and headaches.  Hematological: Does not bruise/bleed easily.    Physical Exam Updated Vital Signs BP (!) 174/88 (BP Location: Right Arm)   Pulse 71   Temp 98.2 F (36.8 C) (Oral)   Resp 20   Ht 5' 4"  (1.626 m)   Wt 77.1 kg   SpO2 99%   BMI 29.18 kg/m   Physical Exam Vitals and nursing note reviewed.  Constitutional:      Appearance: She is well-developed.  HENT:     Head: Normocephalic and atraumatic.  Cardiovascular:     Rate and Rhythm: Normal rate.  Pulmonary:     Effort: Pulmonary effort is normal.  Abdominal:     General: Bowel sounds are normal.  Musculoskeletal:     Cervical back: Normal range of motion and neck supple.     Comments: Patient has generalized swelling over her left ankle with some ecchymosis   Skin:    General: Skin is warm and dry.  Neurological:     Mental Status: She is alert and oriented to person, place, and time.     ED Results / Procedures / Treatments   Labs (all labs ordered are listed, but only abnormal results are displayed) Labs Reviewed - No data to display  EKG None  Radiology DG Ankle Complete Left  Result Date: 06/28/2019 CLINICAL DATA:  Pain status post fall EXAM: LEFT ANKLE COMPLETE - 3+ VIEW COMPARISON:  None. FINDINGS: There are acute displaced fractures of the medial and lateral malleoli. There is widening of the medial clear space. There is no convincing fracture of the posterior malleolus. There is extensive surrounding soft tissue swelling. There is a small plantar calcaneal spur. IMPRESSION: 1. Acute displaced fractures of the medial and lateral malleoli. 2. Widening of the medial clear space. 3. Soft tissue swelling about the ankle. Electronically Signed   By: Constance Holster M.D.   On: 06/28/2019 16:17   DG Foot Complete Left  Result Date: 06/28/2019 CLINICAL DATA:  Pain status post fall EXAM: LEFT  FOOT - COMPLETE 3+ VIEW COMPARISON:  None. FINDINGS: There are moderate degenerative changes at the first metatarsophalangeal joint. There are acute fractures of the distal fibula and medial malleolus. There is no evidence for an acute displaced fracture or dislocation involving the foot. There is a small plantar calcaneal spur. IMPRESSION: 1. There are acute displaced fractures of the distal fibula and medial malleolus. See separate ankle radiographs for further details. 2. No acute displaced fracture involving the foot. 3. Moderate degenerative changes are noted  at the first metatarsophalangeal joint. Electronically Signed   By: Constance Holster M.D.   On: 06/28/2019 15:34    Procedures Procedures (including critical care time)  Medications Ordered in ED Medications - No data to display  ED Course  I have reviewed the triage vital signs and the nursing notes.  Pertinent labs & imaging results that were available during my care of the patient were reviewed by me and considered in my medical decision making (see chart for details).  Clinical Course as of Jun 28 1707  Sun Jun 28, 2019  1617 DG Foot Complete Left [AN]    Clinical Course User Index [AN] Varney Biles, MD   MDM Rules/Calculators/A&P                       Pt comes in with cc of fall. Has bimal ankle fx. Neurovascularly intact. Will put her in splint. She is okay with crutches.  She has wheelchairs at home and her home is fitted to handle persons with disability.  She has seen Dr. Marlou Sa before, once follow-up with the same group.  Final Clinical Impression(s) / ED Diagnoses Final diagnoses:  Bimalleolar ankle fracture, left, closed, initial encounter    Rx / DC Orders ED Discharge Orders         Ordered    acetaminophen (TYLENOL 8 HOUR) 650 MG CR tablet  Every 8 hours PRN     06/28/19 1655    HYDROcodone-acetaminophen (NORCO/VICODIN) 5-325 MG tablet  Every 8 hours PRN     06/28/19 1707             Varney Biles, MD 06/28/19 1715

## 2019-06-29 ENCOUNTER — Encounter: Payer: Self-pay | Admitting: Orthopedic Surgery

## 2019-06-29 ENCOUNTER — Encounter (HOSPITAL_COMMUNITY): Payer: Self-pay | Admitting: Orthopedic Surgery

## 2019-06-29 ENCOUNTER — Other Ambulatory Visit: Payer: Self-pay

## 2019-06-29 ENCOUNTER — Other Ambulatory Visit (HOSPITAL_COMMUNITY)
Admission: RE | Admit: 2019-06-29 | Discharge: 2019-06-29 | Disposition: A | Discharge status: Home / Self Care | Payer: Medicare Other | Source: Ambulatory Visit | Attending: Orthopedic Surgery | Admitting: Orthopedic Surgery

## 2019-06-29 ENCOUNTER — Other Ambulatory Visit: Payer: Self-pay | Admitting: Surgical

## 2019-06-29 ENCOUNTER — Ambulatory Visit (INDEPENDENT_AMBULATORY_CARE_PROVIDER_SITE_OTHER): Payer: Medicare Other | Admitting: Orthopedic Surgery

## 2019-06-29 DIAGNOSIS — Z20822 Contact with and (suspected) exposure to covid-19: Secondary | ICD-10-CM | POA: Diagnosis not present

## 2019-06-29 DIAGNOSIS — Z01812 Encounter for preprocedural laboratory examination: Secondary | ICD-10-CM | POA: Diagnosis not present

## 2019-06-29 DIAGNOSIS — S82842A Displaced bimalleolar fracture of left lower leg, initial encounter for closed fracture: Secondary | ICD-10-CM

## 2019-06-29 LAB — SARS CORONAVIRUS 2 (TAT 6-24 HRS): SARS Coronavirus 2: NEGATIVE

## 2019-06-29 NOTE — Progress Notes (Signed)
Office Visit Note   Patient: Erin Mckee           Date of Birth: January 07, 1946           MRN: [id number] Visit Date: 06/29/2019 Requested by: [name], MD [address] [place],  [place] [address] PCP: [name], MD  Subjective: Chief Complaint  Patient presents with  . Left Ankle - Injury    HPI: Erin Mckee is an ambulatory 74 year old patient who injured her left ankle yesterday after a fall.  She was noted to have bimalleolar ankle fracture in the emergency department.  Splint applied.  She denies any other orthopedic complaints.  Does report left ankle pain.  No personal or family history of DVT or pulmonary embolism.              ROS: All systems reviewed are negative as they relate to the chief complaint within the history of present illness.  Patient denies  fevers or chills.   Assessment & Plan: Visit Diagnoses:  1. Bimalleolar ankle fracture, left, closed, initial encounter     Plan: Impression is closed displaced bimalleolar ankle fracture.  Plan is open reduction internal fixation.  Risk benefits are discussed with the patient including not limited to infection nerve vessel damage ankle stiffness arthritis potential need for more surgery.  All questions answered.  Expected rehab course also described.  Skin is intact at this time.  I want her to elevate this is much as possible tomorrow.  Plan for aspirin for DVT prophylaxis.  Follow-Up Instructions: Return in about 1 week (around 07/06/2019).   Orders:  No orders of the defined types were placed in this encounter.  No orders of the defined types were placed in this encounter.     Procedures: No procedures performed   Clinical Data: No additional findings.  Objective: Vital Signs: There were no vitals taken for this visit.  Physical Exam:   Constitutional: Patient appears well-developed HEENT:  Head: Normocephalic Eyes:EOM are normal Neck: Normal range of motion Cardiovascular: Normal  rate Pulmonary/chest: Effort normal Neurologic: Patient is alert Skin: Skin is warm Psychiatric: Patient has normal mood and affect    Ortho Exam: Ortho exam demonstrates swelling around the left ankle but the skin is intact.  Foot is perfused and sensate.  Compartments are soft.  No left knee effusion.  Specialty Comments:  No specialty comments available.  Imaging: DG Ankle Complete Left  Result Date: 06/28/2019 CLINICAL DATA:  Pain status post fall EXAM: LEFT ANKLE COMPLETE - 3+ VIEW COMPARISON:  None. FINDINGS: There are acute displaced fractures of the medial and lateral malleoli. There is widening of the medial clear space. There is no convincing fracture of the posterior malleolus. There is extensive surrounding soft tissue swelling. There is a small plantar calcaneal spur. IMPRESSION: 1. Acute displaced fractures of the medial and lateral malleoli. 2. Widening of the medial clear space. 3. Soft tissue swelling about the ankle. Electronically Signed   By: Constance Holster M.D.   On: 06/28/2019 16:17   DG Foot Complete Left  Result Date: 06/28/2019 CLINICAL DATA:  Pain status post fall EXAM: LEFT FOOT - COMPLETE 3+ VIEW COMPARISON:  None. FINDINGS: There are moderate degenerative changes at the first metatarsophalangeal joint. There are acute fractures of the distal fibula and medial malleolus. There is no evidence for an acute displaced fracture or dislocation involving the foot. There is a small plantar calcaneal spur. IMPRESSION: 1. There are acute displaced fractures of the  distal fibula and medial malleolus. See separate ankle radiographs for further details. 2. No acute displaced fracture involving the foot. 3. Moderate degenerative changes are noted at the first metatarsophalangeal joint. Electronically Signed   By: Constance Holster M.D.   On: 06/28/2019 15:34     PMFS History: Patient Active Problem List   Diagnosis Date Noted  . Distal radius fracture, right 02/14/2012    . Hyperlipidemia 09/13/2011  . HYPERTENSION 01/15/2008  . 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 DYSPNEA 01/15/2008  . PALPITATIONS, HX OF 01/15/2008   Past Medical History:  Diagnosis Date  . GERD (gastroesophageal reflux disease)   . Heart murmur    resolved- related to stress of throat closing , echo  done 2009  . Hepatitis    hep- a as a child  . Hyperlipidemia   . Hypertension    PCP manages htn, pt. also reports that she has seen Cardiology  at [facility] too.   Marland Kitchen Neuromuscular disorder (HCC)    spasmodic dysphonia   . 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 Hypertension Mother   . Heart disease Mother 55       CAD/CABG  . Hyperlipidemia Mother   . Hypertension Father   . Heart disease Father        AMI  . Hyperlipidemia Father   . Multiple myeloma Father   . Hypertension Sister   . Thyroid disease Sister   . Hypertension Brother   . Crohn's disease Brother   . Cerebral palsy Son   . Hydrocephalus Son   . Colon cancer Neg Hx     Past Surgical History:  Procedure Laterality Date  . FRACTURE SURGERY    . OPEN REDUCTION INTERNAL FIXATION (ORIF) DISTAL RADIAL FRACTURE  02/14/2012   Procedure: OPEN REDUCTION INTERNAL FIXATION (ORIF) DISTAL RADIAL FRACTURE;  Surgeon: Mcarthur Rossetti, MD;  Location: [facility];  Service: Orthopedics;  Laterality: Right;  Open Reduction Internal Fixation Right Wrist  . TONSILLECTOMY     2009   Social History   Occupational History  . Occupation: Software engineer    Comment: retired 2016; now PRN    Employer: [employer]  Tobacco Use  . Smoking status: Never Smoker  . Smokeless tobacco: Never Used  Substance and Sexual Activity  . Alcohol use: Yes    Alcohol/week: 6.0 standard drinks    Types: 6 Glasses of wine per week    Comment: Occasional  . Drug use: No  . Sexual activity: Yes    Birth control/protection: Post-menopausal

## 2019-06-29 NOTE — Progress Notes (Signed)
Spoke with pt for pre-op call. Pt denies cardiac history or Diabetes. Denies any recent chest pain or sob. Pt is treated for HTN.  Covid test done today. Pt states she's been in quarantine since the test was done and understands that she needs to stay in quarantine until she comes to the hospital tomorrow.

## 2019-06-30 ENCOUNTER — Ambulatory Visit (HOSPITAL_COMMUNITY): Payer: Medicare Other

## 2019-06-30 ENCOUNTER — Other Ambulatory Visit: Payer: Self-pay

## 2019-06-30 ENCOUNTER — Ambulatory Visit (HOSPITAL_COMMUNITY): Payer: Medicare Other | Admitting: Certified Registered Nurse Anesthetist

## 2019-06-30 ENCOUNTER — Encounter (HOSPITAL_COMMUNITY): Payer: Self-pay | Admitting: Orthopedic Surgery

## 2019-06-30 ENCOUNTER — Ambulatory Visit (HOSPITAL_COMMUNITY)
Admission: RE | Admit: 2019-06-30 | Discharge: 2019-06-30 | Disposition: A | Discharge status: Home / Self Care | Payer: Medicare Other | Attending: Orthopedic Surgery | Admitting: Orthopedic Surgery

## 2019-06-30 ENCOUNTER — Encounter (HOSPITAL_COMMUNITY)
Admission: RE | Disposition: A | Discharge status: Home / Self Care | Payer: Self-pay | Source: Home / Self Care | Attending: Orthopedic Surgery

## 2019-06-30 DIAGNOSIS — G709 Myoneural disorder, unspecified: Secondary | ICD-10-CM | POA: Insufficient documentation

## 2019-06-30 DIAGNOSIS — Z419 Encounter for procedure for purposes other than remedying health state, unspecified: Secondary | ICD-10-CM

## 2019-06-30 DIAGNOSIS — K219 Gastro-esophageal reflux disease without esophagitis: Secondary | ICD-10-CM | POA: Insufficient documentation

## 2019-06-30 DIAGNOSIS — S82842D Displaced bimalleolar fracture of left lower leg, subsequent encounter for closed fracture with routine healing: Secondary | ICD-10-CM | POA: Diagnosis not present

## 2019-06-30 DIAGNOSIS — E785 Hyperlipidemia, unspecified: Secondary | ICD-10-CM | POA: Insufficient documentation

## 2019-06-30 DIAGNOSIS — S82842A Displaced bimalleolar fracture of left lower leg, initial encounter for closed fracture: Secondary | ICD-10-CM | POA: Insufficient documentation

## 2019-06-30 DIAGNOSIS — Z8249 Family history of ischemic heart disease and other diseases of the circulatory system: Secondary | ICD-10-CM | POA: Insufficient documentation

## 2019-06-30 DIAGNOSIS — X58XXXA Exposure to other specified factors, initial encounter: Secondary | ICD-10-CM | POA: Insufficient documentation

## 2019-06-30 DIAGNOSIS — Z79899 Other long term (current) drug therapy: Secondary | ICD-10-CM | POA: Diagnosis not present

## 2019-06-30 DIAGNOSIS — G8918 Other acute postprocedural pain: Secondary | ICD-10-CM | POA: Diagnosis not present

## 2019-06-30 DIAGNOSIS — I10 Essential (primary) hypertension: Secondary | ICD-10-CM | POA: Diagnosis not present

## 2019-06-30 HISTORY — PX: ORIF ANKLE FRACTURE: SHX5408

## 2019-06-30 LAB — BASIC METABOLIC PANEL
Anion gap: 12 (ref 5–15)
BUN: 9 mg/dL (ref 8–23)
CO2: 27 mmol/L (ref 22–32)
Calcium: 9.6 mg/dL (ref 8.9–10.3)
Chloride: 99 mmol/L (ref 98–111)
Creatinine, Ser: 0.68 mg/dL (ref 0.44–1.00)
GFR calc Af Amer: >60 mL/min (ref >60)
GFR calc non Af Amer: >60 mL/min (ref >60)
Glucose, Bld: 97 mg/dL (ref 70–99)
Potassium: 3.8 mmol/L (ref 3.5–5.1)
Sodium: 138 mmol/L (ref 135–145)

## 2019-06-30 LAB — CBC
HCT: 42.3 % (ref 36.0–46.0)
Hemoglobin: 14.2 g/dL (ref 12.0–15.0)
MCH: 30.7 pg (ref 26.0–34.0)
MCHC: 33.6 g/dL (ref 30.0–36.0)
MCV: 91.6 fL (ref 80.0–100.0)
Platelets: 226 10*3/uL (ref 150–400)
RBC: 4.62 MIL/uL (ref 3.87–5.11)
RDW: 11.9 % (ref 11.5–15.5)
WBC: 9.2 10*3/uL (ref 4.0–10.5)
nRBC: 0 % (ref 0.0–0.2)

## 2019-06-30 SURGERY — OPEN REDUCTION INTERNAL FIXATION (ORIF) ANKLE FRACTURE
Anesthesia: General | Site: Ankle | Laterality: Left

## 2019-06-30 MED ORDER — CEFAZOLIN SODIUM-DEXTROSE 2-4 GM/100ML-% IV SOLN
2.0000 g | INTRAVENOUS | Status: AC
  Administered 2019-06-30: 2 g via INTRAVENOUS

## 2019-06-30 MED ORDER — ONDANSETRON HCL 4 MG/2ML IJ SOLN: 4.0000 mg | Freq: Once | INTRAMUSCULAR | Status: DC | PRN

## 2019-06-30 MED ORDER — METHOCARBAMOL 500 MG PO TABS: 500.0000 mg | ORAL_TABLET | Freq: Three times a day (TID) | ORAL | 0 refills | Status: DC | PRN

## 2019-06-30 MED ORDER — MIDAZOLAM HCL 2 MG/2ML IJ SOLN
INTRAMUSCULAR | Status: AC
  Administered 2019-06-30: 1 mg via INTRAVENOUS
  Filled 2019-06-30: qty 2

## 2019-06-30 MED ORDER — BUPIVACAINE-EPINEPHRINE (PF) 0.5% -1:200000 IJ SOLN
INTRAMUSCULAR | Status: DC | PRN
  Administered 2019-06-30: 30 mL via PERINEURAL
  Administered 2019-06-30: 10 mL via PERINEURAL

## 2019-06-30 MED ORDER — FENTANYL CITRATE (PF) 100 MCG/2ML IJ SOLN
INTRAMUSCULAR | Status: AC
  Administered 2019-06-30: 50 ug via INTRAVENOUS
  Filled 2019-06-30: qty 2

## 2019-06-30 MED ORDER — PROPOFOL 10 MG/ML IV BOLUS
INTRAVENOUS | Status: AC
  Filled 2019-06-30: qty 20

## 2019-06-30 MED ORDER — LIDOCAINE HCL (CARDIAC) PF 100 MG/5ML IV SOSY
PREFILLED_SYRINGE | INTRAVENOUS | Status: DC | PRN
  Administered 2019-06-30: 80 mg via INTRAVENOUS

## 2019-06-30 MED ORDER — HYDROMORPHONE HCL 1 MG/ML IJ SOLN: 0.2500 mg | INTRAMUSCULAR | Status: DC | PRN

## 2019-06-30 MED ORDER — FENTANYL CITRATE (PF) 100 MCG/2ML IJ SOLN
INTRAMUSCULAR | Status: DC | PRN
  Administered 2019-06-30: 50 ug via INTRAVENOUS

## 2019-06-30 MED ORDER — ASPIRIN EC 81 MG PO TBEC: 81.0000 mg | DELAYED_RELEASE_TABLET | Freq: Two times a day (BID) | ORAL | 0 refills | Status: DC

## 2019-06-30 MED ORDER — PHENYLEPHRINE HCL-NACL 10-0.9 MG/250ML-% IV SOLN
INTRAVENOUS | Status: DC | PRN
  Administered 2019-06-30: 30 ug/min via INTRAVENOUS

## 2019-06-30 MED ORDER — ONDANSETRON HCL 4 MG/2ML IJ SOLN
INTRAMUSCULAR | Status: DC | PRN
  Administered 2019-06-30: 4 mg via INTRAVENOUS

## 2019-06-30 MED ORDER — CEFAZOLIN SODIUM-DEXTROSE 2-4 GM/100ML-% IV SOLN
INTRAVENOUS | Status: AC
  Filled 2019-06-30: qty 100

## 2019-06-30 MED ORDER — FENTANYL CITRATE (PF) 250 MCG/5ML IJ SOLN
INTRAMUSCULAR | Status: AC
  Filled 2019-06-30: qty 5

## 2019-06-30 MED ORDER — OXYCODONE HCL 5 MG PO TABS: 5.0000 mg | ORAL_TABLET | Freq: Once | ORAL | Status: DC | PRN

## 2019-06-30 MED ORDER — EPHEDRINE SULFATE-NACL 50-0.9 MG/10ML-% IV SOSY
PREFILLED_SYRINGE | INTRAVENOUS | Status: DC | PRN
  Administered 2019-06-30: 10 mg via INTRAVENOUS

## 2019-06-30 MED ORDER — OXYCODONE HCL 5 MG PO TABS: 5.0000 mg | ORAL_TABLET | ORAL | 0 refills | Status: DC | PRN

## 2019-06-30 MED ORDER — MIDAZOLAM HCL 2 MG/2ML IJ SOLN
1.0000 mg | Freq: Once | INTRAMUSCULAR | Status: AC
  Filled 2019-06-30: qty 1

## 2019-06-30 MED ORDER — DEXAMETHASONE SODIUM PHOSPHATE 4 MG/ML IJ SOLN
INTRAMUSCULAR | Status: DC | PRN
  Administered 2019-06-30: 10 mg via INTRAVENOUS

## 2019-06-30 MED ORDER — PROPOFOL 10 MG/ML IV BOLUS
INTRAVENOUS | Status: DC | PRN
  Administered 2019-06-30: 200 mg via INTRAVENOUS

## 2019-06-30 MED ORDER — 0.9 % SODIUM CHLORIDE (POUR BTL) OPTIME
TOPICAL | Status: DC | PRN
  Administered 2019-06-30 (×3): 1000 mL

## 2019-06-30 MED ORDER — LACTATED RINGERS IV SOLN: INTRAVENOUS | Status: DC | PRN

## 2019-06-30 MED ORDER — FENTANYL CITRATE (PF) 100 MCG/2ML IJ SOLN
50.0000 ug | Freq: Once | INTRAMUSCULAR | Status: AC
  Filled 2019-06-30: qty 1

## 2019-06-30 MED ORDER — OXYCODONE HCL 5 MG/5ML PO SOLN: 5.0000 mg | Freq: Once | ORAL | Status: DC | PRN

## 2019-06-30 SURGICAL SUPPLY — 83 items
BIT DRILL 2.7 QC CANN 155 (BIT) ×2 IMPLANT
BIT DRILL 2.7 QC CANN 155MM (BIT) ×1
BIT DRILL OVR 3.5AO QC SHRT SM (DRILL) ×1 IMPLANT
BIT DRILL QC 2.0 SHORT EVOS SM (DRILL) ×1 IMPLANT
BIT DRILL QC 2.5MM SHRT EVO SM (DRILL) ×1 IMPLANT
BLADE SURG 10 STRL SS (BLADE) IMPLANT
BNDG COHESIVE 6X5 TAN STRL LF (GAUZE/BANDAGES/DRESSINGS) IMPLANT
BNDG ELASTIC 4X5.8 VLCR STR LF (GAUZE/BANDAGES/DRESSINGS) ×3 IMPLANT
BNDG ELASTIC 6X5.8 VLCR STR LF (GAUZE/BANDAGES/DRESSINGS) ×3 IMPLANT
BNDG ESMARK 4X9 LF (GAUZE/BANDAGES/DRESSINGS) ×3 IMPLANT
COVER MAYO STAND STRL (DRAPES) ×3 IMPLANT
COVER SURGICAL LIGHT HANDLE (MISCELLANEOUS) ×3 IMPLANT
COVER WAND RF STERILE (DRAPES) IMPLANT
CUFF TOURN SGL QUICK 34 (TOURNIQUET CUFF)
CUFF TOURN SGL QUICK 42 (TOURNIQUET CUFF) IMPLANT
CUFF TRNQT CYL 34X4.125X (TOURNIQUET CUFF) IMPLANT
DRAPE C-ARM 42X72 X-RAY (DRAPES) ×3 IMPLANT
DRAPE INCISE IOBAN 66X45 STRL (DRAPES) ×3 IMPLANT
DRAPE SURG 17X23 STRL (DRAPES) ×3 IMPLANT
DRAPE U-SHAPE 47X51 STRL (DRAPES) ×3 IMPLANT
DRILL OVER 3.5 AO QC SHORT SM (DRILL) ×3
DRILL QC 2.0 SHORT EVOS SM (DRILL) ×3
DRILL QC 2.5MM SHORT EVOS SM (DRILL) ×3
DRSG AQUACEL AG ADV 3.5X 6 (GAUZE/BANDAGES/DRESSINGS) ×3 IMPLANT
DRSG PAD ABDOMINAL 8X10 ST (GAUZE/BANDAGES/DRESSINGS) ×3 IMPLANT
DURAPREP 26ML APPLICATOR (WOUND CARE) ×6 IMPLANT
ELECT CAUTERY BLADE 6.4 (BLADE) ×3 IMPLANT
ELECT REM PT RETURN 9FT ADLT (ELECTROSURGICAL) ×3
ELECTRODE REM PT RTRN 9FT ADLT (ELECTROSURGICAL) ×1 IMPLANT
GAUZE SPONGE 4X4 12PLY STRL (GAUZE/BANDAGES/DRESSINGS) ×3 IMPLANT
GAUZE XEROFORM 5X9 LF (GAUZE/BANDAGES/DRESSINGS) ×3 IMPLANT
GLOVE BIOGEL PI IND STRL 7.0 (GLOVE) ×1 IMPLANT
GLOVE BIOGEL PI IND STRL 8 (GLOVE) ×1 IMPLANT
GLOVE BIOGEL PI INDICATOR 7.0 (GLOVE) ×2
GLOVE BIOGEL PI INDICATOR 8 (GLOVE) ×2
GLOVE ECLIPSE 7.0 STRL STRAW (GLOVE) ×3 IMPLANT
GLOVE ECLIPSE 8.0 STRL XLNG CF (GLOVE) ×3 IMPLANT
GOWN STRL REUS W/ TWL LRG LVL3 (GOWN DISPOSABLE) ×3 IMPLANT
GOWN STRL REUS W/TWL LRG LVL3 (GOWN DISPOSABLE) ×6
GUIDE PIN 1.3 (PIN) ×6
HANDPIECE INTERPULSE COAX TIP (DISPOSABLE)
KIT BASIN OR (CUSTOM PROCEDURE TRAY) ×3 IMPLANT
KIT TURNOVER KIT B (KITS) ×3 IMPLANT
MANIFOLD NEPTUNE II (INSTRUMENTS) ×3 IMPLANT
NEEDLE HYPO 25GX1X1/2 BEV (NEEDLE) IMPLANT
NS IRRIG 1000ML POUR BTL (IV SOLUTION) ×3 IMPLANT
PACK ORTHO EXTREMITY (CUSTOM PROCEDURE TRAY) ×3 IMPLANT
PAD ARMBOARD 7.5X6 YLW CONV (MISCELLANEOUS) ×6 IMPLANT
PAD CAST 4YDX4 CTTN HI CHSV (CAST SUPPLIES) ×1 IMPLANT
PADDING CAST COTTON 4X4 STRL (CAST SUPPLIES) ×2
PIN GUIDE 1.3 (PIN) ×2 IMPLANT
PLATE 5H L 81MM FIBULA EVOS (Plate) ×3 IMPLANT
SCREW CANN 4.0 46MM (Screw) ×6 IMPLANT
SCREW CORT 2.7X12 EVOS (Screw) ×1 IMPLANT
SCREW CORT 2.7X16 STAR T8 EVOS (Screw) ×9 IMPLANT
SCREW CORT 3.5X13MM (Screw) ×3 IMPLANT
SCREW CORT 3.5X14 ST EVOS (Screw) ×6 IMPLANT
SCREW CORT EVOS ST 3.5X12 (Screw) ×6 IMPLANT
SCREW CORT EVOS ST T8 2.7X14MM (Screw) ×2 IMPLANT
SCREW CORTEX 3.5X26 (Screw) ×3 IMPLANT
SCREW LOCK 2.7X13 ST EVOS (Screw) ×3 IMPLANT
SET HNDPC FAN SPRY TIP SCT (DISPOSABLE) IMPLANT
SPONGE LAP 18X18 RF (DISPOSABLE) ×3 IMPLANT
STOCKINETTE IMPERVIOUS 9X36 MD (GAUZE/BANDAGES/DRESSINGS) IMPLANT
SUCTION FRAZIER HANDLE 10FR (MISCELLANEOUS) ×2
SUCTION TUBE FRAZIER 10FR DISP (MISCELLANEOUS) ×1 IMPLANT
SUT ETHILON 3 0 PS 1 (SUTURE) ×9 IMPLANT
SUT MNCRL AB 3-0 PS2 18 (SUTURE) IMPLANT
SUT VIC AB 0 CT1 27 (SUTURE) ×4
SUT VIC AB 0 CT1 27XBRD ANBCTR (SUTURE) ×2 IMPLANT
SUT VIC AB 2-0 CT1 27 (SUTURE) ×6
SUT VIC AB 2-0 CT1 TAPERPNT 27 (SUTURE) ×3 IMPLANT
SUT VIC AB 2-0 CTB1 (SUTURE) ×3 IMPLANT
SUT VIC AB 3-0 SH 27 (SUTURE) ×2
SUT VIC AB 3-0 SH 27X BRD (SUTURE) ×1 IMPLANT
SYR CONTROL 10ML LL (SYRINGE) IMPLANT
TOWEL GREEN STERILE (TOWEL DISPOSABLE) ×3 IMPLANT
TOWEL GREEN STERILE FF (TOWEL DISPOSABLE) ×3 IMPLANT
TUBE CONNECTING 12'X1/4 (SUCTIONS) ×1
TUBE CONNECTING 12X1/4 (SUCTIONS) ×2 IMPLANT
WASHER SCREW EVOS 3.5 (Washer) ×3 IMPLANT
WATER STERILE IRR 1000ML POUR (IV SOLUTION) ×3 IMPLANT
YANKAUER SUCT BULB TIP NO VENT (SUCTIONS) ×3 IMPLANT

## 2019-06-30 NOTE — H&P (Signed)
Erin Mckee is an 74 y.o. female.   Chief Complaint: Left ankle pain HPI: Erin Mckee is a 74 year old patient with left ankle pain.  Sustained an injury several days ago where she sustained bimalleolar ankle fracture which is displaced.  Yesterday in the clinic the skin was intact with ankle swelling.  Mortise is asymmetric with some movement to the left.  She presents now for operative management after explanation risk benefits.  No personal or family history of DVT or pulmonary embolism.  Past Medical History:  Diagnosis Date  . GERD (gastroesophageal reflux disease)    spasmodic dysphonia  . Heart murmur    resolved- related to stress of throat closing , echo  done 2009  . Hepatitis    hep- a as a child  . Hyperlipidemia   . Hypertension    PCP manages htn, pt. also reports that she has seen Cardiology  at [facility] too.   Marland Kitchen Neuromuscular disorder (HCC)    spasmodic dysphonia   . 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 COLONOSCOPY    . FRACTURE SURGERY    . OPEN REDUCTION INTERNAL FIXATION (ORIF) DISTAL RADIAL FRACTURE  02/14/2012   Procedure: OPEN REDUCTION INTERNAL FIXATION (ORIF) DISTAL RADIAL FRACTURE;  Surgeon: Mcarthur Rossetti, MD;  Location: [facility];  Service: Orthopedics;  Laterality: Right;  Open Reduction Internal Fixation Right Wrist  . TONSILLECTOMY     2009    Family History  Problem Relation Age of Onset  . Hypertension Mother   . Heart disease Mother 39       CAD/CABG  . Hyperlipidemia Mother   . Hypertension Father   . Heart disease Father        AMI  . Hyperlipidemia Father   . Multiple myeloma Father   . Hypertension Sister   . Thyroid disease Sister   . Hypertension Brother   . Crohn's disease Brother   . Cerebral palsy Son   . Hydrocephalus Son   . Colon cancer Neg Hx    Social History:  reports that she has never smoked. She has never used smokeless tobacco. She reports  current alcohol use of about 6.0 standard drinks of alcohol per week. She reports that she does not use drugs.  Allergies: No Known Allergies  Medications Prior to Admission  Medication Sig Dispense Refill  . hydrochlorothiazide (HYDRODIURIL) 25 MG tablet Take 1 tablet (25 mg total) by mouth daily. 90 tablet 3  . HYDROcodone-acetaminophen (NORCO/VICODIN) 5-325 MG tablet Take 1 tablet by mouth every 8 (eight) hours as needed for up to 3 days for severe pain. 9 tablet 0  . LORazepam (ATIVAN) 1 MG tablet Take 1 tablet (1 mg total) by mouth 2 (two) times daily as needed. For throat spasms (Patient taking differently: Take 1 mg by mouth 2 (two) times daily as needed (For throat spasms). ) 60 tablet 1  . metoprolol succinate (TOPROL-XL) 50 MG 24 hr tablet Take 1 tablet (50 mg total) by mouth daily. 90 tablet 3  . acetaminophen (TYLENOL 8 HOUR) 650 MG CR tablet Take 1 tablet (650 mg total) by mouth every 8 (eight) hours as needed for pain or fever. 30 tablet 0    Results for orders placed or performed during the hospital encounter of 06/30/19 (from the past 48 hour(s))  CBC     Status: None   Collection Time: 06/30/19  2:00 PM  Result Value  Ref Range   WBC 9.2 4.0 - 10.5 K/uL   RBC 4.62 3.87 - 5.11 MIL/uL   Hemoglobin 14.2 12.0 - 15.0 g/dL   HCT 42.3 36.0 - 46.0 %   MCV 91.6 80.0 - 100.0 fL   MCH 30.7 26.0 - 34.0 pg   MCHC 33.6 30.0 - 36.0 g/dL   RDW 11.9 11.5 - 15.5 %   Platelets 226 150 - 400 K/uL   nRBC 0.0 0.0 - 0.2 %    Comment: Performed at [facility] [address] [place], [place] [address]   No results found.  Review of Systems  Musculoskeletal: Positive for arthralgias.  All other systems reviewed and are negative.   Blood pressure (!) 144/66, pulse 63, temperature 98.2 F (36.8 C), temperature source Oral, resp. rate 17, height 5' 4"  (1.626 m), weight 77.1 kg, SpO2 100 %. Physical Exam  Constitutional: She appears well-developed.  HENT:  Head: Normocephalic.   Eyes: Pupils are equal, round, and reactive to light.  Cardiovascular: Normal rate.  Respiratory: Effort normal.  Musculoskeletal:     Cervical back: Normal range of motion.  Neurological: She is alert.  Skin: Skin is warm.  Psychiatric: She has a normal mood and affect.  Ortho exam demonstrates swelling in the left ankle but the foot is perfused and sensate.  Compartments are soft.  No knee effusion.  Patient has intact sensation on the dorsum plantar aspect of the foot.  Assessment/Plan Impression is displaced bimalleolar ankle fracture.  Plan is open reduction internal fixation.  Risk benefits are discussed include not limited to infection nerve vessel damage bleeding ankle arthritis ankle stiffness as well as potential need for surgery.  All questions answered.  Erin Malta, MD 06/30/2019, 3:05 PM

## 2019-06-30 NOTE — Op Note (Signed)
NAME: Erin Mckee, Erin Mckee MEDICAL RECORD [id number] ACCOUNT [id number] DATE OF BIRTH:11-08-1945 FACILITY: [facility] LOCATION: [facility] PHYSICIAN:Tattianna Schnarr Randel Pigg, MD  OPERATIVE REPORT  DATE OF PROCEDURE:  06/30/2019  PREOPERATIVE DIAGNOSIS:  Left bimalleolar ankle fracture.  POSTOPERATIVE DIAGNOSIS:  Left bimalleolar ankle fracture.  PROCEDURE:  Open reduction internal fixation of left bimalleolar ankle fracture.  SURGEON:  Meredith Pel, MD  ASSISTANT:  Annie Main, PA.  INDICATIONS:  The patient is a 74 year old patient with left ankle pain.  She presents now for operative management after explanation of risks and benefits.  The ankle fracture is displaced and unstable.  PROCEDURE IN DETAIL:  The patient was brought to the operating room where general anesthetic was induced.  Preoperative antibiotics were administered.  Timeout was called.  Left leg was prescrubbed with alcohol and Betadine, allowed to air dry, prepped  with DuraPrep solution and draped in a sterile manner.  Ioban used to cover the operative field.  Timeout was called.  Ankle Esmarch utilized for approximately 40 minutes.  A lateral incision was made.  Care was taken to avoid injury to the superficial  peroneal nerve.  Fracture site was identified, irrigated, reduced.  Lag screw placed proximal anterior to distal posterior.  Good reduction confirmed in the AP and lateral planes under fluoroscopy.  A Smith and Nephew 7-hole plate applied with cortical  screws proximally and locking screws distally.  Overall, good reduction was achieved.  Attention then directed towards the medial side.  A large medial malleolar fragment was present.  This was opened.  A small fracture blister was about 3 cm away from  the incision, which had appeared in the interim between clinic visit yesterday and today.  This was out of the operative field and covered with the [place].  The incision was opened.  Saphenous vein and nerve mobilized  anteriorly.  Fracture reduced.  Two  4.0, 46 mm cannulated partially threaded cancellous screws were placed with good fracture reduction achieved in the AP and lateral planes and on the mortise view.  Syndesmosis was stable under stress.  At this time, ankle Esmarch was released.  Thorough  irrigation was performed.  The incision was then closed using 2-0 Vicryl and 3-0 nylon.  The patient tolerated the procedure well without immediate complications.  Luke's assistance was required for opening and closing.  His assistance was a medical  necessity.  VN/NUANCE  D:06/30/2019 T:06/30/2019 JOB:[id number]

## 2019-06-30 NOTE — Brief Op Note (Signed)
   06/30/2019  5:35 PM  PATIENT:  Gennie Alma  74 y.o. female  PRE-OPERATIVE DIAGNOSIS:  left bimalleolar fracture  POST-OPERATIVE DIAGNOSIS:  left bimalleolar fracture  PROCEDURE:  Procedure(s): OPEN REDUCTION INTERNAL FIXATION (ORIF) LEFT ANKLE FRACTURE  SURGEON:  Surgeon(s): Meredith Pel, MD  ASSISTANT: magnant pa  ANESTHESIA:   general  EBL: 25 ml    Total I/O In: 500 [I.V.:500] Out: 100 [Blood:100]  BLOOD ADMINISTERED: none  DRAINS: none   LOCAL MEDICATIONS USED:  none  SPECIMEN:  No Specimen  COUNTS:  YES  TOURNIQUET:  * Missing tourniquet times found for documented tourniquets in log: [id number] *  DICTATION: .Other Dictation: Dictation Number [id number]  PLAN OF CARE: Discharge to home after PACU  PATIENT DISPOSITION:  PACU - hemodynamically stable

## 2019-06-30 NOTE — Anesthesia Procedure Notes (Signed)
Anesthesia Regional Block: Adductor canal block   Pre-Anesthetic Checklist: ,, timeout performed, Correct Patient, Correct Site, Correct Laterality, Correct Procedure, Correct Position, site marked, Risks and benefits discussed,  Surgical consent,  Pre-op evaluation,  At surgeon's request and post-op pain management  Laterality: Left  Prep: chloraprep       Needles:  Injection technique: Single-shot  Needle Type: Echogenic Needle     Needle Length: 9cm  Needle Gauge: 21     Additional Needles:   Procedures:,,,, ultrasound used (permanent image in chart),,,,  Narrative:  Start time: 06/30/2019 3:05 PM End time: 06/30/2019 3:10 PM Injection made incrementally with aspirations every 5 mL.  Performed by: Personally  Anesthesiologist: Catalina Gravel, MD  Additional Notes: No pain on injection. No increased resistance to injection. Injection made in 5cc increments.  Good needle visualization.  Patient tolerated procedure well.

## 2019-06-30 NOTE — Anesthesia Procedure Notes (Signed)
Procedure Name: LMA Insertion Date/Time: 06/30/2019 3:37 PM Performed by: Griffin Dakin, CRNA Pre-anesthesia Checklist: Patient identified, Emergency Drugs available, Suction available and Patient being monitored Patient Re-evaluated:Patient Re-evaluated prior to induction Oxygen Delivery Method: Circle system utilized Preoxygenation: Pre-oxygenation with 100% oxygen Induction Type: IV induction LMA: LMA inserted LMA Size: 4.0 Number of attempts: 1 Placement Confirmation: positive ETCO2 and breath sounds checked- equal and bilateral Tube secured with: Tape Dental Injury: Teeth and Oropharynx as per pre-operative assessment

## 2019-06-30 NOTE — Transfer of Care (Signed)
Immediate Anesthesia Transfer of Care Note  Patient: Erin Mckee  Procedure(s) Performed: OPEN REDUCTION INTERNAL FIXATION (ORIF) LEFT ANKLE FRACTURE (Left Ankle)  Patient Location: PACU  Anesthesia Type:General  Level of Consciousness: drowsy  Airway & Oxygen Therapy: Patient Spontanous Breathing  Post-op Assessment: Report given to RN and Post -op Vital signs reviewed and stable  Post vital signs: Reviewed and stable  Last Vitals:  Vitals Value Taken Time  BP 126/65 06/30/19 1728  Temp    Pulse 62 06/30/19 1731  Resp 18 06/30/19 1731  SpO2 93 % 06/30/19 1731  Vitals shown include unvalidated device data.  Last Pain:  Vitals:   06/30/19 1504  TempSrc:   PainSc: 0-No pain      Patients Stated Pain Goal: 3 ([id number])  Complications: No apparent anesthesia complications

## 2019-06-30 NOTE — Anesthesia Procedure Notes (Signed)
Anesthesia Regional Block: Popliteal block   Pre-Anesthetic Checklist: ,, timeout performed, Correct Patient, Correct Site, Correct Laterality, Correct Procedure, Correct Position, site marked, Risks and benefits discussed,  Surgical consent,  Pre-op evaluation,  At surgeon's request and post-op pain management  Laterality: Left  Prep: chloraprep       Needles:  Injection technique: Single-shot  Needle Type: Echogenic Needle     Needle Length: 9cm  Needle Gauge: 21     Additional Needles:   Procedures:,,,, ultrasound used (permanent image in chart),,,,  Narrative:  Start time: 06/30/2019 2:55 PM End time: 06/30/2019 3:05 PM Injection made incrementally with aspirations every 5 mL.  Performed by: Personally  Anesthesiologist: Catalina Gravel, MD  Additional Notes: No pain on injection. No increased resistance to injection. Injection made in 5cc increments.  Good needle visualization.  Patient tolerated procedure well.

## 2019-06-30 NOTE — Anesthesia Preprocedure Evaluation (Addendum)
Anesthesia Evaluation  Patient identified by MRN, date of birth, ID band Patient awake    Reviewed: Allergy & Precautions, H&P , NPO status , Patient's Chart, lab work & pertinent test results, reviewed documented beta blocker date and time   Airway Mallampati: II  TM Distance: >3 FB Neck ROM: Full    Dental  (+) Teeth Intact, Dental Advisory Given   Pulmonary neg pulmonary ROS,    breath sounds clear to auscultation       Cardiovascular hypertension, Pt. on medications and Pt. on home beta blockers  Rhythm:Regular Rate:Normal     Neuro/Psych  Neuromuscular disease negative psych ROS   GI/Hepatic GERD  Medicated and Controlled,(+)     substance abuse  alcohol use,   Endo/Other  negative endocrine ROS  Renal/GU negative Renal ROS  negative genitourinary   Musculoskeletal Left bimalleolar fx   Abdominal Normal abdominal exam  (+)   Peds  Hematology negative hematology ROS (+)   Anesthesia Other Findings abnl pharyngeal anatomy with kissing tonsillar pillars even after tonsilectomy in relaxed state which causes pt respiratory difficulty Hoarse, spasmodic dysphonia   Reproductive/Obstetrics negative OB ROS                           Anesthesia Physical Anesthesia Plan  ASA: II  Anesthesia Plan:    Post-op Pain Management:    Induction: Intravenous  PONV Risk Score and Plan: 2  Airway Management Planned:   Additional Equipment:   Intra-op Plan:   Post-operative Plan: Extubation in OR  Informed Consent: I have reviewed the patients History and Physical, chart, labs and discussed the procedure including the risks, benefits and alternatives for the proposed anesthesia with the patient or authorized representative who has indicated his/her understanding and acceptance.     Dental advisory given  Plan Discussed with: CRNA  Anesthesia Plan Comments:         Anesthesia Quick  Evaluation

## 2019-07-01 ENCOUNTER — Encounter: Payer: Self-pay | Admitting: *Deleted

## 2019-07-01 NOTE — Anesthesia Postprocedure Evaluation (Signed)
Anesthesia Post Note  Patient: Erin Mckee  Procedure(s) Performed: OPEN REDUCTION INTERNAL FIXATION (ORIF) LEFT ANKLE FRACTURE (Left Ankle)     Patient location during evaluation: PACU Anesthesia Type: General Level of consciousness: awake and alert Pain management: pain level controlled Vital Signs Assessment: post-procedure vital signs reviewed and stable Respiratory status: spontaneous breathing, nonlabored ventilation, respiratory function stable and patient connected to nasal cannula oxygen Cardiovascular status: blood pressure returned to baseline and stable Postop Assessment: no apparent nausea or vomiting Anesthetic complications: no    Last Vitals:  Vitals:   06/30/19 1745 06/30/19 1750  BP: (!) 141/70   Pulse: 63   Resp: 17   Temp:  (!) 36.3 C  SpO2: 96%     Last Pain:  Vitals:   06/30/19 1745  TempSrc:   PainSc: 0-No pain                 Catalina Gravel

## 2019-07-07 ENCOUNTER — Ambulatory Visit (INDEPENDENT_AMBULATORY_CARE_PROVIDER_SITE_OTHER): Payer: Medicare Other | Admitting: Family Medicine

## 2019-07-07 ENCOUNTER — Ambulatory Visit: Payer: Medicare Other

## 2019-07-07 VITALS — BP 134/76 | Ht 64.0 in | Wt 170.0 lb

## 2019-07-07 DIAGNOSIS — Z0000 Encounter for general adult medical examination without abnormal findings: Secondary | ICD-10-CM

## 2019-07-07 NOTE — Patient Instructions (Signed)
Thank you for taking time to come for your Medicare Wellness Visit. I appreciate your ongoing commitment to your health goals. Please review the following plan we discussed and let me know if I can assist you in the future.  Devani Odonnel LPN Preventive Care 74 Years and Older, Female Preventive care refers to lifestyle choices and visits with your health care provider that can promote health and wellness. This includes:  A yearly physical exam. This is also called an annual well check.  Regular dental and eye exams.  Immunizations.  Screening for certain conditions.  Healthy lifestyle choices, such as diet and exercise. What can I expect for my preventive care visit? Physical exam Your health care provider will check:  Height and weight. These may be used to calculate body mass index (BMI), which is a measurement that tells if you are at a healthy weight.  Heart rate and blood pressure.  Your skin for abnormal spots. Counseling Your health care provider may ask you questions about:  Alcohol, tobacco, and drug use.  Emotional well-being.  Home and relationship well-being.  Sexual activity.  Eating habits.  History of falls.  Memory and ability to understand (cognition).  Work and work environment.  Pregnancy and menstrual history. What immunizations do I need?  Influenza (flu) vaccine  This is recommended every year. Tetanus, diphtheria, and pertussis (Tdap) vaccine  You may need a Td booster every 10 years. Varicella (chickenpox) vaccine  You may need this vaccine if you have not already been vaccinated. Zoster (shingles) vaccine  You may need this after age 60. Pneumococcal conjugate (PCV13) vaccine  One dose is recommended after age 74. Pneumococcal polysaccharide (PPSV23) vaccine  One dose is recommended after age 74. Measles, mumps, and rubella (MMR) vaccine  You may need at least one dose of MMR if you were born in 1957 or later. You may also need  a second dose. Meningococcal conjugate (MenACWY) vaccine  You may need this if you have certain conditions. Hepatitis A vaccine  You may need this if you have certain conditions or if you travel or work in places where you may be exposed to hepatitis A. Hepatitis B vaccine  You may need this if you have certain conditions or if you travel or work in places where you may be exposed to hepatitis B. Haemophilus influenzae type b (Hib) vaccine  You may need this if you have certain conditions. You may receive vaccines as individual doses or as more than one vaccine together in one shot (combination vaccines). Talk with your health care provider about the risks and benefits of combination vaccines. What tests do I need? Blood tests  Lipid and cholesterol levels. These may be checked every 5 years, or more frequently depending on your overall health.  Hepatitis C test.  Hepatitis B test. Screening  Lung cancer screening. You may have this screening every year starting at age 55 if you have a 30-pack-year history of smoking and currently smoke or have quit within the past 15 years.  Colorectal cancer screening. All adults should have this screening starting at age 50 and continuing until age 75. Your health care provider may recommend screening at age 45 if you are at increased risk. You will have tests every 1-10 years, depending on your results and the type of screening test.  Diabetes screening. This is done by checking your blood sugar (glucose) after you have not eaten for a while (fasting). You may have this done every 1-3 years.    Mammogram. This may be done every 1-2 years. Talk with your health care provider about how often you should have regular mammograms.  BRCA-related cancer screening. This may be done if you have a family history of breast, ovarian, tubal, or peritoneal cancers. Other tests  Sexually transmitted disease (STD) testing.  Bone density scan. This is done to  screen for osteoporosis. You may have this done starting at age 31. Follow these instructions at home: Eating and drinking  Eat a diet that includes fresh fruits and vegetables, whole grains, lean protein, and low-fat dairy products. Limit your intake of foods with high amounts of sugar, saturated fats, and salt.  Take vitamin and mineral supplements as recommended by your health care provider.  Do not drink alcohol if your health care provider tells you not to drink.  If you drink alcohol: ? Limit how much you have to 0-1 drink a day. ? Be aware of how much alcohol is in your drink. In the [place] one drink equals one 12 oz bottle of beer (355 mL), one 5 oz glass of wine (148 mL), or one 1 oz glass of hard liquor (44 mL). Lifestyle  Take daily care of your teeth and gums.  Stay active. Exercise for at least 30 minutes on 5 or more days each week.  Do not use any products that contain nicotine or tobacco, such as cigarettes, e-cigarettes, and chewing tobacco. If you need help quitting, ask your health care provider.  If you are sexually active, practice safe sex. Use a condom or other form of protection in order to prevent STIs (sexually transmitted infections).  Talk with your health care provider about taking a low-dose aspirin or statin. What's next?  Go to your health care provider once a year for a well check visit.  Ask your health care provider how often you should have your eyes and teeth checked.  Stay up to date on all vaccines. This information is not intended to replace advice given to you by your health care provider. Make sure you discuss any questions you have with your health care provider. Document Revised: 03/13/2018 Document Reviewed: 03/13/2018 Elsevier Patient Education  2020 [employer].

## 2019-07-07 NOTE — Progress Notes (Signed)
Presents today for [employer] Visit   Date of last exam: 09/09/2018  Interpreter used for this visit? No  I connected with  Erin Mckee on 07/07/19 by telephone  application and verified that I am speaking with the correct person using two identifiers.   I discussed the limitations of evaluation and management by telemedicine. The patient expressed understanding and agreed to proceed.    Patient Care Team: [name], MD as PCP - General (Family Medicine)   Other items to address today:   Discussed Eye/dental Patient will call to schedule follow up with Dr. Carlota Raspberry Patient fell walking her dog and broke her ankle, is recovering with no issues.      Other Screening: Last screening for diabetes: 09-09-2018 Last lipid screening: 09-09-2018  ADVANCE DIRECTIVES: Discussed: yes On File: no Materials Provided: no  Patient has this at home to fill out  Immunization status:  Immunization History  Administered Date(s) Administered  . Hepatitis B 04/14/1997  . Influenza Split 01/17/2012, 12/15/2013  . Pneumococcal Conjugate-13 02/22/2014  . Pneumococcal Polysaccharide-23 04/14/2012  . Td 09/09/2018  . Tdap 04/02/2008  . Zoster 02/01/2015     There are no preventive care reminders to display for this patient.   Functional Status Survey: Is the patient deaf or have difficulty hearing?: No Does the patient have difficulty seeing, even when wearing glasses/contacts?: No Does the patient have difficulty concentrating, remembering, or making decisions?: No Does the patient have difficulty walking or climbing stairs?: No Does the patient have difficulty dressing or bathing?: No Does the patient have difficulty doing errands alone such as visiting a doctor's office or shopping?: No   6CIT Screen 07/07/2019 04/29/2018 04/26/2017  What Year? 0 points 0 points 0 points  What month? 0 points 0 points 0 points  What time? 0 points 0 points 0 points    Count back from 20 2 points 0 points 0 points  Months in reverse 0 points 0 points 0 points  Repeat phrase 0 points 0 points 0 points  Total Score 2 0 0        Clinical Support from 07/07/2019 in [place] [facility]  AUDIT-C Score  6       Home Environment:   Lives in a one story home Has 5 horses No scattered rugs yes grab bars Adequate lighting/no clutter   Patient Active Problem List   Diagnosis Date Noted  . Distal radius fracture, right 02/14/2012  . Hyperlipidemia 09/13/2011  . HYPERTENSION 01/15/2008  . 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 DYSPNEA 01/15/2008  . PALPITATIONS, HX OF 01/15/2008     Past Medical History:  Diagnosis Date  . GERD (gastroesophageal reflux disease)    spasmodic dysphonia  . Heart murmur    resolved- related to stress of throat closing , echo  done 2009  . Hepatitis    hep- a as a child  . Hyperlipidemia   . Hypertension    PCP manages htn, pt. also reports that she has seen Cardiology  at [facility] too.   Marland Kitchen Neuromuscular disorder (HCC)    spasmodic dysphonia   . 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 COLONOSCOPY    . FRACTURE SURGERY    . OPEN REDUCTION INTERNAL FIXATION (ORIF) DISTAL RADIAL FRACTURE  02/14/2012   Procedure: OPEN REDUCTION INTERNAL FIXATION (ORIF) DISTAL RADIAL FRACTURE;  Surgeon: Lind Guest  Ninfa Linden, MD;  Location: [place];  Service: Orthopedics;  Laterality: Right;  Open Reduction Internal Fixation Right Wrist  . ORIF ANKLE FRACTURE Left 06/30/2019   Procedure: OPEN REDUCTION INTERNAL FIXATION (ORIF) LEFT ANKLE FRACTURE;  Surgeon: Meredith Pel, MD;  Location: [place];  Service: Orthopedics;  Laterality: Left;  . TONSILLECTOMY     2009     Family History  Problem Relation Age of Onset  . Hypertension Mother   . Heart disease Mother 72       CAD/CABG  . Hyperlipidemia Mother   . Hypertension Father   . Heart disease Father         AMI  . Hyperlipidemia Father   . Multiple myeloma Father   . Hypertension Sister   . Thyroid disease Sister   . Hypertension Brother   . Crohn's disease Brother   . Cerebral palsy Son   . Hydrocephalus Son   . Colon cancer Neg Hx      Social History   Socioeconomic History  . Marital status: Married    Spouse name: Not on file  . Number of children: 2  . Years of education: Not on file  . Highest education level: Professional school degree (e.g., MD, DDS, DVM, JD)  Occupational History  . Occupation: Software engineer    Comment: retired 2016; now PRN    Employer: [employer]  Tobacco Use  . Smoking status: Never Smoker  . Smokeless tobacco: Never Used  Substance and Sexual Activity  . Alcohol use: Yes    Alcohol/week: 6.0 standard drinks    Types: 6 Glasses of wine per week    Comment: Occasional  . Drug use: No  . Sexual activity: Yes    Birth control/protection: Post-menopausal  Other Topics Concern  . Not on file  Social History Narrative   Industrial/product designer   Married   Exercises   Social Determinants of Health   Financial Resource Strain:   . Difficulty of Paying Living Expenses:   Food Insecurity:   . Worried About Charity fundraiser in the Last Year:   . Arboriculturist in the Last Year:   Transportation Needs:   . Film/video editor (Medical):   Marland Kitchen Lack of Transportation (Non-Medical):   Physical Activity:   . Days of Exercise per Week:   . Minutes of Exercise per Session:   Stress:   . Feeling of Stress :   Social Connections:   . Frequency of Communication with Friends and Family:   . Frequency of Social Gatherings with Friends and Family:   . Attends Religious Services:   . Active Member of Clubs or Organizations:   . Attends Archivist Meetings:   Marland Kitchen Marital Status:   Intimate Partner Violence:   . Fear of Current or Ex-Partner:   . Emotionally Abused:   Marland Kitchen Physically Abused:   . Sexually Abused:      No Known  Allergies   Prior to Admission medications   Medication Sig Start Date End Date Taking? Authorizing Provider  acetaminophen (TYLENOL 8 HOUR) 650 MG CR tablet Take 1 tablet (650 mg total) by mouth every 8 (eight) hours as needed for pain or fever. 06/28/19  Yes Varney Biles, MD  aspirin EC 81 MG tablet Take 1 tablet (81 mg total) by mouth 2 (two) times daily. 06/30/19 06/29/20 Yes Magnant, Charles L, PA-C  hydrochlorothiazide (HYDRODIURIL) 25 MG tablet Take 1 tablet (25 mg total) by mouth daily.  09/09/18  Yes [name], MD  LORazepam (ATIVAN) 1 MG tablet Take 1 tablet (1 mg total) by mouth 2 (two) times daily as needed. For throat spasms Patient taking differently: Take 1 mg by mouth 2 (two) times daily as needed (For throat spasms).  09/09/18  Yes [name], MD  metoprolol succinate (TOPROL-XL) 50 MG 24 hr tablet Take 1 tablet (50 mg total) by mouth daily. 09/09/18  Yes [name], MD  methocarbamol (ROBAXIN) 500 MG tablet Take 1 tablet (500 mg total) by mouth every 8 (eight) hours as needed. Patient not taking: Reported on 07/07/2019 06/30/19   Magnant, Gerrianne Scale, PA-C  oxyCODONE (ROXICODONE) 5 MG immediate release tablet Take 1 tablet (5 mg total) by mouth every 4 (four) hours as needed. Patient not taking: Reported on 07/07/2019 06/30/19 06/29/20  Rosalin Hawking     Depression screen [facility] 2/9 07/07/2019 09/09/2018 09/09/2018 04/29/2018 09/12/2017  Decreased Interest 0 0 0 0 0  Down, Depressed, Hopeless 0 0 0 0 0  PHQ - 2 Score 0 0 0 0 0     Fall Risk  07/07/2019 09/09/2018 09/09/2018 04/29/2018 09/12/2017  Falls in the past year? 1 0 0 0 No  Number falls in past yr: 0 0 0 0 -  Injury with Fall? 1 0 0 0 -  Comment fell walking dog broke ankle - - - -  Follow up Falls evaluation completed;Education provided - Falls evaluation completed - -      PHYSICAL EXAM: BP 134/76 Comment: taken from a previous visit  Ht _0  (1.626 m)   Wt 170 lb (77.1 kg)   BMI 29.18 kg/m    Wt  Readings from Last 3 Encounters:  07/07/19 170 lb (77.1 kg)  06/30/19 170 lb (77.1 kg)  06/28/19 170 lb (77.1 kg)       Education/Counseling provided regarding diet and exercise, prevention of chronic diseases, smoking/tobacco cessation, if applicable, and reviewed "Covered Medicare Preventive Services."

## 2019-07-08 ENCOUNTER — Ambulatory Visit (INDEPENDENT_AMBULATORY_CARE_PROVIDER_SITE_OTHER): Payer: Medicare Other | Admitting: Orthopedic Surgery

## 2019-07-08 ENCOUNTER — Other Ambulatory Visit: Payer: Self-pay

## 2019-07-08 ENCOUNTER — Encounter: Payer: Self-pay | Admitting: Orthopedic Surgery

## 2019-07-08 ENCOUNTER — Ambulatory Visit (INDEPENDENT_AMBULATORY_CARE_PROVIDER_SITE_OTHER): Payer: Medicare Other

## 2019-07-08 DIAGNOSIS — S82842A Displaced bimalleolar fracture of left lower leg, initial encounter for closed fracture: Secondary | ICD-10-CM

## 2019-07-10 ENCOUNTER — Encounter: Payer: Self-pay | Admitting: Orthopedic Surgery

## 2019-07-10 NOTE — Progress Notes (Signed)
Post-Op Visit Note   Patient: Erin Mckee           Date of Birth: 1945-10-10           MRN: [id number] Visit Date: 07/08/2019 PCP: [name], MD   Assessment & Plan:  Chief Complaint: No chief complaint on file.  Visit Diagnoses:  1. Bimalleolar ankle fracture, left, closed, initial encounter     Plan: Prop is now about a week out from left bimalleolar ankle fracture fixation.  On exam the incisions look intact.  No calf tenderness negative Homans today.  Plan is for her to come back in 5 days for suture removal.  Put her in a small fracture boot today so she can start doing little bit of ankle range of motion exercises.  Elevation and compression important.  Follow-Up Instructions: Return in about 5 days (around 07/13/2019).   Orders:  Orders Placed This Encounter  Procedures  . XR Ankle Complete Left   No orders of the defined types were placed in this encounter.   Imaging: No results found.  PMFS History: Patient Active Problem List   Diagnosis Date Noted  . Distal radius fracture, right 02/14/2012  . Hyperlipidemia 09/13/2011  . HYPERTENSION 01/15/2008  . 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 DYSPNEA 01/15/2008  . PALPITATIONS, HX OF 01/15/2008   Past Medical History:  Diagnosis Date  . GERD (gastroesophageal reflux disease)    spasmodic dysphonia  . Heart murmur    resolved- related to stress of throat closing , echo  done 2009  . Hepatitis    hep- a as a child  . Hyperlipidemia   . Hypertension    PCP manages htn, pt. also reports that she has seen Cardiology  at [facility] too.   Marland Kitchen Neuromuscular disorder (HCC)    spasmodic dysphonia   . 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 Hypertension Mother   . Heart disease Mother 10       CAD/CABG  . Hyperlipidemia Mother   . Hypertension Father   . Heart disease Father        AMI  . Hyperlipidemia Father   . Multiple myeloma Father    . Hypertension Sister   . Thyroid disease Sister   . Hypertension Brother   . Crohn's disease Brother   . Cerebral palsy Son   . Hydrocephalus Son   . Colon cancer Neg Hx     Past Surgical History:  Procedure Laterality Date  . COLONOSCOPY    . FRACTURE SURGERY    . OPEN REDUCTION INTERNAL FIXATION (ORIF) DISTAL RADIAL FRACTURE  02/14/2012   Procedure: OPEN REDUCTION INTERNAL FIXATION (ORIF) DISTAL RADIAL FRACTURE;  Surgeon: Mcarthur Rossetti, MD;  Location: [place];  Service: Orthopedics;  Laterality: Right;  Open Reduction Internal Fixation Right Wrist  . ORIF ANKLE FRACTURE Left 06/30/2019   Procedure: OPEN REDUCTION INTERNAL FIXATION (ORIF) LEFT ANKLE FRACTURE;  Surgeon: Meredith Pel, MD;  Location: [place];  Service: Orthopedics;  Laterality: Left;  . TONSILLECTOMY     2009   Social History   Occupational History  . Occupation: Software engineer    Comment: retired 2016; now PRN    Employer: [employer]  Tobacco Use  . Smoking status: Never Smoker  . Smokeless tobacco: Never Used  Substance and Sexual Activity  . Alcohol use: Yes    Alcohol/week: 6.0 standard drinks  Types: 6 Glasses of wine per week    Comment: Occasional  . Drug use: No  . Sexual activity: Yes    Birth control/protection: Post-menopausal

## 2019-07-10 NOTE — Progress Notes (Signed)
Post-Op Visit Note   Patient: Erin Mckee           Date of Birth: [birth year]           MRN: [id number] Visit Date: 07/08/2019 PCP: [name], MD   Assessment & Plan:  Chief Complaint: No chief complaint on file.  Visit Diagnoses:  1. Bimalleolar ankle fracture, left, closed, initial encounter     Plan: Patient is a 74 year old female who presents s/p left ankle ORIF on 06/30/2019.  Patient notes that she is doing well overall with minimal pain.  Sutures are intact.  She has been compliant with taking aspirin twice a day.  She has no significant calf tenderness on exam today.  Incisions are healing well.  She is not taking any pain medication.  Splint was removed today and patient was placed in a fracture boot.  She will continue nonweightbearing and plan to return to the office next Monday for suture removal.  Patient agreed with this plan.  Radiographs taken today show the fractures are well reduced and the mortise is preserved.  There are no significant shifts in hardware position since the intraoperative films.  Follow-Up Instructions: No follow-ups on file.   Orders:  Orders Placed This Encounter  Procedures  . XR Ankle Complete Left   No orders of the defined types were placed in this encounter.   Imaging: No results found.  PMFS History: Patient Active Problem List   Diagnosis Date Noted  . Distal radius fracture, right 02/14/2012  . Hyperlipidemia 09/13/2011  . HYPERTENSION 01/15/2008  . 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 DYSPNEA 01/15/2008  . PALPITATIONS, HX OF 01/15/2008   Past Medical History:  Diagnosis Date  . GERD (gastroesophageal reflux disease)    spasmodic dysphonia  . Heart murmur    resolved- related to stress of throat closing , echo  done 2009  . Hepatitis    hep- a as a child  . Hyperlipidemia   . Hypertension    PCP manages htn, pt. also reports that she has seen Cardiology  at [facility] too.   Marland Kitchen Neuromuscular disorder (HCC)    spasmodic dysphonia   . 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 Hypertension Mother   . Heart disease Mother 30       CAD/CABG  . Hyperlipidemia Mother   . Hypertension Father   . Heart disease Father        AMI  . Hyperlipidemia Father   . Multiple myeloma Father   . Hypertension Sister   . Thyroid disease Sister   . Hypertension Brother   . Crohn's disease Brother   . Cerebral palsy Son   . Hydrocephalus Son   . Colon cancer Neg Hx     Past Surgical History:  Procedure Laterality Date  . COLONOSCOPY    . FRACTURE SURGERY    . OPEN REDUCTION INTERNAL FIXATION (ORIF) DISTAL RADIAL FRACTURE  02/14/2012   Procedure: OPEN REDUCTION INTERNAL FIXATION (ORIF) DISTAL RADIAL FRACTURE;  Surgeon: Mcarthur Rossetti, MD;  Location: [place];  Service: Orthopedics;  Laterality: Right;  Open Reduction Internal Fixation Right Wrist  . ORIF ANKLE FRACTURE Left 06/30/2019   Procedure: OPEN REDUCTION INTERNAL FIXATION (ORIF) LEFT ANKLE FRACTURE;  Surgeon: Meredith Pel, MD;  Location: [place];  Service: Orthopedics;  Laterality: Left;  . TONSILLECTOMY     2009   Social History  Occupational History  . Occupation: Software engineer    Comment: retired 2016; now PRN    Employer: [employer]  Tobacco Use  . Smoking status: Never Smoker  . Smokeless tobacco: Never Used  Substance and Sexual Activity  . Alcohol use: Yes    Alcohol/week: 6.0 standard drinks    Types: 6 Glasses of wine per week    Comment: Occasional  . Drug use: No  . Sexual activity: Yes    Birth control/protection: Post-menopausal

## 2019-07-13 ENCOUNTER — Encounter: Payer: Self-pay | Admitting: Orthopedic Surgery

## 2019-07-13 ENCOUNTER — Ambulatory Visit (INDEPENDENT_AMBULATORY_CARE_PROVIDER_SITE_OTHER): Payer: Medicare Other | Admitting: Orthopedic Surgery

## 2019-07-13 ENCOUNTER — Other Ambulatory Visit: Payer: Self-pay

## 2019-07-13 DIAGNOSIS — S82842A Displaced bimalleolar fracture of left lower leg, initial encounter for closed fracture: Secondary | ICD-10-CM

## 2019-07-14 ENCOUNTER — Encounter: Payer: Self-pay | Admitting: Orthopedic Surgery

## 2019-07-14 NOTE — Progress Notes (Signed)
Office Visit Note   Patient: Erin Mckee           Date of Birth: 1945-11-15           MRN: [id number] Visit Date: 07/13/2019 Requested by: [name], MD [address] [place],  [place] [address] PCP: [name], MD  Subjective: Chief Complaint  Patient presents with  . Left Ankle - Follow-up    HPI: Erin Mckee is a patient with left ankle pain.  She has been in a cam boot.  On exam the incisions are intact.  A little bit of redness around the inferior aspect of that medial incision.  Lateral incision is intact.  Negative Homans today.  She does have a little bit of swelling.  I Erin Mckee write her prescription for some of the Viva socks.  Sutures are discontinued today.  Continue with exercises for the ankle.  Return in 1 week just to recheck that incision.  Okay for touchdown weightbearing.              ROS: See above  Assessment & Plan: Visit Diagnoses:  1. Bimalleolar ankle fracture, left, closed, initial encounter     Plan: See above  Follow-Up Instructions: Return in about 1 week (around 07/20/2019).   Orders:  No orders of the defined types were placed in this encounter.  No orders of the defined types were placed in this encounter.     Procedures: No procedures performed   Clinical Data: No additional findings.  Objective: Vital Signs: There were no vitals taken for this visit.  Physical Exam: See above  Ortho Exam: See above  Specialty Comments:  No specialty comments available.  Imaging: No results found.   PMFS History: Patient Active Problem List   Diagnosis Date Noted  . Distal radius fracture, right 02/14/2012  . Hyperlipidemia 09/13/2011  . HYPERTENSION 01/15/2008  . 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 DYSPNEA 01/15/2008  . PALPITATIONS, HX OF 01/15/2008   Past Medical History:  Diagnosis Date  . GERD (gastroesophageal reflux disease)    spasmodic dysphonia  . Heart murmur    resolved- related to stress of throat closing  , echo  done 2009  . Hepatitis    hep- a as a child  . Hyperlipidemia   . Hypertension    PCP manages htn, pt. also reports that she has seen Cardiology  at [facility] too.   Marland Kitchen Neuromuscular disorder (HCC)    spasmodic dysphonia   . 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 Hypertension Mother   . Heart disease Mother 34       CAD/CABG  . Hyperlipidemia Mother   . Hypertension Father   . Heart disease Father        AMI  . Hyperlipidemia Father   . Multiple myeloma Father   . Hypertension Sister   . Thyroid disease Sister   . Hypertension Brother   . Crohn's disease Brother   . Cerebral palsy Son   . Hydrocephalus Son   . Colon cancer Neg Hx     Past Surgical History:  Procedure Laterality Date  . COLONOSCOPY    . FRACTURE SURGERY    . OPEN REDUCTION INTERNAL FIXATION (ORIF) DISTAL RADIAL FRACTURE  02/14/2012   Procedure: OPEN REDUCTION INTERNAL FIXATION (ORIF) DISTAL RADIAL FRACTURE;  Surgeon: Mcarthur Rossetti, MD;  Location: [place];  Service: Orthopedics;  Laterality: Right;  Open Reduction Internal Fixation Right Wrist  . ORIF ANKLE FRACTURE Left 06/30/2019   Procedure: OPEN REDUCTION INTERNAL FIXATION (ORIF) LEFT ANKLE FRACTURE;  Surgeon: Meredith Pel, MD;  Location: [place];  Service: Orthopedics;  Laterality: Left;  . TONSILLECTOMY     2009   Social History   Occupational History  . Occupation: Software engineer    Comment: retired 2016; now PRN    Employer: [employer]  Tobacco Use  . Smoking status: Never Smoker  . Smokeless tobacco: Never Used  Substance and Sexual Activity  . Alcohol use: Yes    Alcohol/week: 6.0 standard drinks    Types: 6 Glasses of wine per week    Comment: Occasional  . Drug use: No  . Sexual activity: Yes    Birth control/protection: Post-menopausal

## 2019-07-22 ENCOUNTER — Other Ambulatory Visit: Payer: Self-pay

## 2019-07-22 ENCOUNTER — Other Ambulatory Visit: Payer: Self-pay | Admitting: Surgical

## 2019-07-22 ENCOUNTER — Ambulatory Visit (INDEPENDENT_AMBULATORY_CARE_PROVIDER_SITE_OTHER): Payer: Medicare Other | Admitting: Orthopedic Surgery

## 2019-07-22 DIAGNOSIS — S82842A Displaced bimalleolar fracture of left lower leg, initial encounter for closed fracture: Secondary | ICD-10-CM

## 2019-07-22 MED ORDER — PENTOXIFYLLINE ER 400 MG PO TBCR: 400.0000 mg | EXTENDED_RELEASE_TABLET | Freq: Three times a day (TID) | ORAL | 0 refills | Status: DC

## 2019-07-22 MED ORDER — NITROGLYCERIN 0.2 MG/HR TD PT24: 0.2000 mg | MEDICATED_PATCH | Freq: Every day | TRANSDERMAL | 0 refills | Status: DC

## 2019-07-24 ENCOUNTER — Encounter: Payer: Self-pay | Admitting: Orthopedic Surgery

## 2019-07-24 NOTE — Progress Notes (Signed)
Post-Op Visit Note   Patient: Erin Mckee           Date of Birth: 04-05-1945           MRN: [id number] Visit Date: 07/22/2019 PCP: [name], MD   Assessment & Plan:  Chief Complaint:  Chief Complaint  Patient presents with  . Left Knee - Follow-up   Visit Diagnoses:  1. Bimalleolar ankle fracture, left, closed, initial encounter     Plan: Patient is a 74 year old female who presents s/p left ankle ORIF on 06/30/2019.  She returns for wound check.  Lateral incision is healing very well.  Medial incision is healing well proximally but still struggling a bit distally.  No expressible drainage.  No significant surrounding erythema.  Patient denies any constitutional symptoms such as fevers, chills, malaise.  She is currently touchdown weightbearing.  We will discontinue the boot today and progress her to partial weightbearing in regular shoe.  She will continue to use the Vive sock.  Additionally we will prescribe nitroglycerin patches to use along the incision.  Instructed her how to use the patches correctly.  Prescribed Trental all to use 3 times a day as well.  Patient will follow up in 1 week for clinical recheck with x-ray check.  We will also have Dr. Sharol Given evaluate her wound at that time.  Patient agrees with plan.  No indication for any type of surgical debridement at this time.  Follow-Up Instructions: No follow-ups on file.   Orders:  No orders of the defined types were placed in this encounter.  Meds ordered this encounter  Medications  . nitroGLYCERIN (NITRODUR - DOSED IN MG/24 HR) 0.2 mg/hr patch    Sig: Place 1 patch (0.2 mg total) onto the skin daily. Place around the incision as directed in the office.    Dispense:  30 patch    Refill:  0  . pentoxifylline (TRENTAL) 400 MG CR tablet    Sig: Take 1 tablet (400 mg total) by mouth 3 (three) times daily with meals.    Dispense:  90 tablet    Refill:  0    Imaging: No results found.  PMFS  History: Patient Active Problem List   Diagnosis Date Noted  . Distal radius fracture, right 02/14/2012  . Hyperlipidemia 09/13/2011  . HYPERTENSION 01/15/2008  . 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 DYSPNEA 01/15/2008  . PALPITATIONS, HX OF 01/15/2008   Past Medical History:  Diagnosis Date  . GERD (gastroesophageal reflux disease)    spasmodic dysphonia  . Heart murmur    resolved- related to stress of throat closing , echo  done 2009  . Hepatitis    hep- a as a child  . Hyperlipidemia   . Hypertension    PCP manages htn, pt. also reports that she has seen Cardiology  at [facility] too.   Marland Kitchen Neuromuscular disorder (HCC)    spasmodic dysphonia   . 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 Hypertension Mother   . Heart disease Mother 72       CAD/CABG  . Hyperlipidemia Mother   . Hypertension Father   . Heart disease Father        AMI  . Hyperlipidemia Father   . Multiple myeloma Father   . Hypertension Sister   . Thyroid disease Sister   . Hypertension Brother   . Crohn's disease Brother   .  Cerebral palsy Son   . Hydrocephalus Son   . Colon cancer Neg Hx     Past Surgical History:  Procedure Laterality Date  . COLONOSCOPY    . FRACTURE SURGERY    . OPEN REDUCTION INTERNAL FIXATION (ORIF) DISTAL RADIAL FRACTURE  02/14/2012   Procedure: OPEN REDUCTION INTERNAL FIXATION (ORIF) DISTAL RADIAL FRACTURE;  Surgeon: Mcarthur Rossetti, MD;  Location: [place];  Service: Orthopedics;  Laterality: Right;  Open Reduction Internal Fixation Right Wrist  . ORIF ANKLE FRACTURE Left 06/30/2019   Procedure: OPEN REDUCTION INTERNAL FIXATION (ORIF) LEFT ANKLE FRACTURE;  Surgeon: Meredith Pel, MD;  Location: [place];  Service: Orthopedics;  Laterality: Left;  . TONSILLECTOMY     2009   Social History   Occupational History  . Occupation: Software engineer    Comment: retired 2016; now PRN    Employer: [employer]  Tobacco  Use  . Smoking status: Never Smoker  . Smokeless tobacco: Never Used  Substance and Sexual Activity  . Alcohol use: Yes    Alcohol/week: 6.0 standard drinks    Types: 6 Glasses of wine per week    Comment: Occasional  . Drug use: No  . Sexual activity: Yes    Birth control/protection: Post-menopausal

## 2019-07-30 ENCOUNTER — Ambulatory Visit (INDEPENDENT_AMBULATORY_CARE_PROVIDER_SITE_OTHER): Payer: Medicare Other | Admitting: Orthopedic Surgery

## 2019-07-30 ENCOUNTER — Encounter: Payer: Self-pay | Admitting: Orthopedic Surgery

## 2019-07-30 ENCOUNTER — Other Ambulatory Visit: Payer: Self-pay

## 2019-07-30 VITALS — Ht 64.0 in | Wt 170.0 lb

## 2019-07-30 DIAGNOSIS — S82842A Displaced bimalleolar fracture of left lower leg, initial encounter for closed fracture: Secondary | ICD-10-CM

## 2019-07-30 DIAGNOSIS — L97321 Non-pressure chronic ulcer of left ankle limited to breakdown of skin: Secondary | ICD-10-CM

## 2019-07-30 DIAGNOSIS — I83023 Varicose veins of left lower extremity with ulcer of ankle: Secondary | ICD-10-CM

## 2019-07-30 MED ORDER — DOXYCYCLINE HYCLATE 100 MG PO TABS: 100.0000 mg | ORAL_TABLET | Freq: Two times a day (BID) | ORAL | 0 refills | Status: DC

## 2019-07-30 NOTE — Progress Notes (Signed)
Office Visit Note   Patient: Erin Mckee           Date of Birth: Aug 28, 1945           MRN: [id number] Visit Date: 07/30/2019              Requested by: [name], MD [address] [place],  [place] [address] PCP: [name], MD  Chief Complaint  Patient presents with  . Left Ankle - Open Wound    06/30/19 ORIF bimal ankle fracture with Dr. Marlou Sa      HPI: Patient is a 74 year old woman who is seen in referral from Dr. Marlou Sa.  She is status post open reduction internal fixation bimalleolar closed left ankle fracture with ulceration and slow healing of the medial incision with chronic venous insufficiency.  Patient was started on Trental and a compression sock to help with the healing patient states she did have some discomfort and has not been wearing the compression sock.  Assessment & Plan: Visit Diagnoses:  1. 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 Bimalleolar ankle fracture, left, closed, initial encounter     Plan: A prescription was called in for doxycycline recommend she wear the compression sock around-the-clock okay for touchdown weightbearing on the left.  Follow-Up Instructions: Return in about 1 week (around 08/06/2019).   Ortho Exam  Patient is alert, oriented, no adenopathy, well-dressed, normal affect, normal respiratory effort. Examination patient has a strong dorsalis pedis pulse she has pitting edema with chronic venous insufficiency and varicose veins.  There is slight wound dehiscence medially secondary to the venous insufficiency.  With compression around the wound most of the redness resolves however she still has a little bit of persistent redness so an antibiotic will be started in case this is cellulitis clinically without the soft tissue being symptomatic this is most likely venous dermatitis.  Recommended she wear the compression sock directly against the wound to help with away moisture  and nonviable tissue.  Imaging: No results found. No images are attached to the encounter.  Labs: Lab Results  Component Value Date   HGBA1C 5.3 09/12/2017     Lab Results  Component Value Date   ALBUMIN 4.7 09/09/2018   ALBUMIN 4.7 09/12/2017   ALBUMIN 4.6 04/26/2017    No results found for: MG No results found for: VD25OH  No results found for: PREALBUMIN CBC EXTENDED Latest Ref Rng & Units 06/30/2019 04/26/2017 10/05/2016  WBC 4.0 - 10.5 K/uL 9.2 6.6 6.7  RBC 3.87 - 5.11 MIL/uL 4.62 4.89 4.66  HGB 12.0 - 15.0 g/dL 14.2 14.7 14.3  HCT 36.0 - 46.0 % 42.3 44.0 41.8  PLT 150 - 400 K/uL 226 257 238  NEUTROABS 1.4 - 7.0 x10E3/uL - 4.1 3.9  LYMPHSABS 0.7 - 3.1 x10E3/uL - 1.6 2.3     Body mass index is 29.18 kg/m.  Orders:  No orders of the defined types were placed in this encounter.  Meds ordered this encounter  Medications  . doxycycline (VIBRA-TABS) 100 MG tablet    Sig: Take 1 tablet (100 mg total) by mouth 2 (two) times daily.    Dispense:  20 tablet    Refill:  0     Procedures: No procedures performed  Clinical Data: No additional findings.  ROS:  All other systems negative, except as noted in the HPI. Review of Systems  Objective: Vital Signs: Ht 5' 4"  (1.626  m)   Wt 170 lb (77.1 kg)   BMI 29.18 kg/m   Specialty Comments:  No specialty comments available.  PMFS History: Patient Active Problem List   Diagnosis Date Noted  . Distal radius fracture, right 02/14/2012  . Hyperlipidemia 09/13/2011  . HYPERTENSION 01/15/2008  . 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 DYSPNEA 01/15/2008  . PALPITATIONS, HX OF 01/15/2008   Past Medical History:  Diagnosis Date  . GERD (gastroesophageal reflux disease)    spasmodic dysphonia  . Heart murmur    resolved- related to stress of throat closing , echo  done 2009  . Hepatitis    hep- a as a child  . Hyperlipidemia   . Hypertension    PCP manages htn, pt. also reports that she has seen Cardiology  at  [facility] too.   Marland Kitchen Neuromuscular disorder (HCC)    spasmodic dysphonia   . 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 Hypertension Mother   . Heart disease Mother 65       CAD/CABG  . Hyperlipidemia Mother   . Hypertension Father   . Heart disease Father        AMI  . Hyperlipidemia Father   . Multiple myeloma Father   . Hypertension Sister   . Thyroid disease Sister   . Hypertension Brother   . Crohn's disease Brother   . Cerebral palsy Son   . Hydrocephalus Son   . Colon cancer Neg Hx     Past Surgical History:  Procedure Laterality Date  . COLONOSCOPY    . FRACTURE SURGERY    . OPEN REDUCTION INTERNAL FIXATION (ORIF) DISTAL RADIAL FRACTURE  02/14/2012   Procedure: OPEN REDUCTION INTERNAL FIXATION (ORIF) DISTAL RADIAL FRACTURE;  Surgeon: Mcarthur Rossetti, MD;  Location: [place];  Service: Orthopedics;  Laterality: Right;  Open Reduction Internal Fixation Right Wrist  . ORIF ANKLE FRACTURE Left 06/30/2019   Procedure: OPEN REDUCTION INTERNAL FIXATION (ORIF) LEFT ANKLE FRACTURE;  Surgeon: Meredith Pel, MD;  Location: [place];  Service: Orthopedics;  Laterality: Left;  . TONSILLECTOMY     2009   Social History   Occupational History  . Occupation: Software engineer    Comment: retired 2016; now PRN    Employer: [employer]  Tobacco Use  . Smoking status: Never Smoker  . Smokeless tobacco: Never Used  Substance and Sexual Activity  . Alcohol use: Yes    Alcohol/week: 6.0 standard drinks    Types: 6 Glasses of wine per week    Comment: Occasional  . Drug use: No  . Sexual activity: Yes    Birth control/protection: Post-menopausal

## 2019-08-06 ENCOUNTER — Encounter: Payer: Self-pay | Admitting: Orthopedic Surgery

## 2019-08-06 ENCOUNTER — Other Ambulatory Visit: Payer: Self-pay

## 2019-08-06 ENCOUNTER — Ambulatory Visit (INDEPENDENT_AMBULATORY_CARE_PROVIDER_SITE_OTHER): Payer: Medicare Other | Admitting: Orthopedic Surgery

## 2019-08-06 VITALS — Ht 64.0 in | Wt 170.0 lb

## 2019-08-06 DIAGNOSIS — L97321 Non-pressure chronic ulcer of left ankle limited to breakdown of skin: Secondary | ICD-10-CM

## 2019-08-06 DIAGNOSIS — I83023 Varicose veins of left lower extremity with ulcer of ankle: Secondary | ICD-10-CM

## 2019-08-06 DIAGNOSIS — S82842A Displaced bimalleolar fracture of left lower leg, initial encounter for closed fracture: Secondary | ICD-10-CM

## 2019-08-06 NOTE — Progress Notes (Signed)
Office Visit Note   Patient: Erin Mckee           Date of Birth: Oct 10, 1945           MRN: [id number] Visit Date: 08/06/2019              Requested by: [name], MD [address] [place],  [place] [address] PCP: [name], MD  Chief Complaint  Patient presents with  . Left Ankle - Routine Post Op    06/30/19 ORIF bimal ankle fx with Dr. Marlou Sa       HPI: Patient is a 74 year old woman with venous insufficiency status post open reduction internal fixation of her left ankle about 5 weeks ago.  Patient is completing her course of doxycycline patient states she is wearing compression socks as much as she can.  Patient is currently ambulating in flip-flops and holding crutches.  Assessment & Plan: Visit Diagnoses:  1. 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 Bimalleolar ankle fracture, left, closed, initial encounter     Plan: Recommended continue with compression socks recommended weaning off the crutches and her fracture boot as she feels comfortable.  Patient requested note to return to work next week a note was provided she will increase her activities as tolerated.  Follow-Up Instructions: Return in about 4 weeks (around 09/03/2019).   Ortho Exam  Patient is alert, oriented, no adenopathy, well-dressed, normal affect, normal respiratory effort. Examination patient does have's persistent venous swelling the ulcer is healing nicely she has a good dorsalis pedis pulse she has good dorsiflexion of the ankle there is no cellulitis no drainage no signs of infection.  She is currently full weightbearing without symptoms.  Imaging: No results found. No images are attached to the encounter.  Labs: Lab Results  Component Value Date   HGBA1C 5.3 09/12/2017     Lab Results  Component Value Date   ALBUMIN 4.7 09/09/2018   ALBUMIN 4.7 09/12/2017   ALBUMIN 4.6 04/26/2017    No results found for: MG No  results found for: VD25OH  No results found for: PREALBUMIN CBC EXTENDED Latest Ref Rng & Units 06/30/2019 04/26/2017 10/05/2016  WBC 4.0 - 10.5 K/uL 9.2 6.6 6.7  RBC 3.87 - 5.11 MIL/uL 4.62 4.89 4.66  HGB 12.0 - 15.0 g/dL 14.2 14.7 14.3  HCT 36.0 - 46.0 % 42.3 44.0 41.8  PLT 150 - 400 K/uL 226 257 238  NEUTROABS 1.4 - 7.0 x10E3/uL - 4.1 3.9  LYMPHSABS 0.7 - 3.1 x10E3/uL - 1.6 2.3     Body mass index is 29.18 kg/m.  Orders:  No orders of the defined types were placed in this encounter.  No orders of the defined types were placed in this encounter.    Procedures: No procedures performed  Clinical Data: No additional findings.  ROS:  All other systems negative, except as noted in the HPI. Review of Systems  Objective: Vital Signs: Ht 5' 4"  (1.626 m)   Wt 170 lb (77.1 kg)   BMI 29.18 kg/m   Specialty Comments:  No specialty comments available.  PMFS History: Patient Active Problem List   Diagnosis Date Noted  . Distal radius fracture, right 02/14/2012  . Hyperlipidemia 09/13/2011  . HYPERTENSION 01/15/2008  . 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 DYSPNEA 01/15/2008  . PALPITATIONS, HX OF 01/15/2008   Past Medical History:  Diagnosis Date  . GERD (gastroesophageal reflux disease)  spasmodic dysphonia  . Heart murmur    resolved- related to stress of throat closing , echo  done 2009  . Hepatitis    hep- a as a child  . Hyperlipidemia   . Hypertension    PCP manages htn, pt. also reports that she has seen Cardiology  at [facility] too.   Marland Kitchen Neuromuscular disorder (HCC)    spasmodic dysphonia   . 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 Hypertension Mother   . Heart disease Mother 37       CAD/CABG  . Hyperlipidemia Mother   . Hypertension Father   . Heart disease Father        AMI  . Hyperlipidemia Father   . Multiple myeloma Father   . Hypertension Sister   . Thyroid disease Sister   .  Hypertension Brother   . Crohn's disease Brother   . Cerebral palsy Son   . Hydrocephalus Son   . Colon cancer Neg Hx     Past Surgical History:  Procedure Laterality Date  . COLONOSCOPY    . FRACTURE SURGERY    . OPEN REDUCTION INTERNAL FIXATION (ORIF) DISTAL RADIAL FRACTURE  02/14/2012   Procedure: OPEN REDUCTION INTERNAL FIXATION (ORIF) DISTAL RADIAL FRACTURE;  Surgeon: Mcarthur Rossetti, MD;  Location: [place];  Service: Orthopedics;  Laterality: Right;  Open Reduction Internal Fixation Right Wrist  . ORIF ANKLE FRACTURE Left 06/30/2019   Procedure: OPEN REDUCTION INTERNAL FIXATION (ORIF) LEFT ANKLE FRACTURE;  Surgeon: Meredith Pel, MD;  Location: [place];  Service: Orthopedics;  Laterality: Left;  . TONSILLECTOMY     2009   Social History   Occupational History  . Occupation: Software engineer    Comment: retired 2016; now PRN    Employer: [employer]  Tobacco Use  . Smoking status: Never Smoker  . Smokeless tobacco: Never Used  Substance and Sexual Activity  . Alcohol use: Yes    Alcohol/week: 6.0 standard drinks    Types: 6 Glasses of wine per week    Comment: Occasional  . Drug use: No  . Sexual activity: Yes    Birth control/protection: Post-menopausal

## 2019-08-25 DIAGNOSIS — H25043 Posterior subcapsular polar age-related cataract, bilateral: Secondary | ICD-10-CM | POA: Diagnosis not present

## 2019-08-25 DIAGNOSIS — H52203 Unspecified astigmatism, bilateral: Secondary | ICD-10-CM | POA: Diagnosis not present

## 2019-08-25 DIAGNOSIS — H524 Presbyopia: Secondary | ICD-10-CM | POA: Diagnosis not present

## 2019-08-25 DIAGNOSIS — H5203 Hypermetropia, bilateral: Secondary | ICD-10-CM | POA: Diagnosis not present

## 2019-09-03 ENCOUNTER — Encounter: Payer: Self-pay | Admitting: Orthopedic Surgery

## 2019-09-03 ENCOUNTER — Other Ambulatory Visit: Payer: Self-pay

## 2019-09-03 ENCOUNTER — Ambulatory Visit (INDEPENDENT_AMBULATORY_CARE_PROVIDER_SITE_OTHER): Payer: Medicare Other | Admitting: Orthopedic Surgery

## 2019-09-03 VITALS — Ht 64.0 in | Wt 170.0 lb

## 2019-09-03 DIAGNOSIS — S82842A Displaced bimalleolar fracture of left lower leg, initial encounter for closed fracture: Secondary | ICD-10-CM

## 2019-09-03 DIAGNOSIS — I83023 Varicose veins of left lower extremity with ulcer of ankle: Secondary | ICD-10-CM

## 2019-09-03 DIAGNOSIS — L97321 Non-pressure chronic ulcer of left ankle limited to breakdown of skin: Secondary | ICD-10-CM

## 2019-09-04 ENCOUNTER — Encounter: Payer: Self-pay | Admitting: Family Medicine

## 2019-09-04 ENCOUNTER — Ambulatory Visit (INDEPENDENT_AMBULATORY_CARE_PROVIDER_SITE_OTHER): Payer: Medicare Other | Admitting: Family Medicine

## 2019-09-04 VITALS — BP 140/82 | HR 64 | Temp 98.0°F | Ht 64.0 in | Wt 178.0 lb

## 2019-09-04 DIAGNOSIS — I10 Essential (primary) hypertension: Secondary | ICD-10-CM | POA: Diagnosis not present

## 2019-09-04 DIAGNOSIS — J383 Other diseases of vocal cords: Secondary | ICD-10-CM | POA: Diagnosis not present

## 2019-09-04 DIAGNOSIS — R490 Dysphonia: Secondary | ICD-10-CM | POA: Diagnosis not present

## 2019-09-04 DIAGNOSIS — E785 Hyperlipidemia, unspecified: Secondary | ICD-10-CM | POA: Diagnosis not present

## 2019-09-04 MED ORDER — LORAZEPAM 1 MG PO TABS: 1.0000 mg | ORAL_TABLET | Freq: Two times a day (BID) | ORAL | 1 refills | Status: DC | PRN

## 2019-09-04 MED ORDER — HYDROCHLOROTHIAZIDE 25 MG PO TABS: 25.0000 mg | ORAL_TABLET | Freq: Every day | ORAL | 3 refills | Status: DC

## 2019-09-04 MED ORDER — METOPROLOL SUCCINATE ER 50 MG PO TB24: 50.0000 mg | ORAL_TABLET | Freq: Every day | ORAL | 3 refills | Status: DC

## 2019-09-04 NOTE — Progress Notes (Signed)
Subjective:  Patient ID: Erin Mckee, female    DOB: 01/24/1946  Age: 74 y.o. MRN: [id number]  CC:  Chief Complaint  Patient presents with  . Medication Refill    on Metoprolol,Lorazepam, and hydrochlorothizide. pt reports these medications are working well with no know side effects. pt states she is taking thes medications as directed by the provider.    HPI Erin Mckee presents for   Hypertension: Treated with hydrochlorothiazide 25 mg daily, Toprol-XL 50 mg daily. Home readings: 130's usually/70's. No new side effects of meds.  Still working on occasion.  BP Readings from Last 3 Encounters:  09/04/19 140/82  07/07/19 134/76  06/30/19 (!) 141/70   Lab Results  Component Value Date   CREATININE 0.68 06/30/2019   Dysphonia with vocal cord dysfunction See previous notes.  Has been treated with episodic use of Ativan that has been helpful.  1 mg.  Infrequent use..  Discussed combining narcotic pain medicine with benzodiazepines and risks in the past.  Avoidance of combination discussed prior - had been treated for ankle injury. Did not need oxycodone. ortho - Dr. Marlou Sa, then Dr. Sharol Given.  Controlled substance database (PDMP) reviewed. No concerns appreciated.  Last prescription for #60 on 12/11/2018. Needs refill.  meds working well - still infrequent use. No current narcotics.   Hyperlipidemia: Elevated ASCVD risk were discussed in the past as well as recommendations for statin.  Plan for diet/exercise approach but CVD/CAD risks with elevated cholesterol have been discussed previously. Plans on diet for weight loss - some weight gain with pandemic and ankle injury. Would consider med if high enough.  The 10-year ASCVD risk score Mikey Bussing DC Brooke Bonito., et al., 2013) is: 22.9%   Values used to calculate the score:     Age: 89 years     Sex: Female     Is Non-Hispanic African American: No     Diabetic: No     Tobacco smoker: No     Systolic Blood Pressure: 016 mmHg     Is BP  treated: Yes     HDL Cholesterol: 59 mg/dL     Total Cholesterol: 249 mg/dL   Lab Results  Component Value Date   CHOL 249 (H) 09/09/2018   HDL 59 09/09/2018   LDLCALC 153 (H) 09/09/2018   TRIG 185 (H) 09/09/2018   CHOLHDL 4.2 09/09/2018   Lab Results  Component Value Date   ALT 20 09/09/2018   AST 20 09/09/2018   ALKPHOS 102 09/09/2018   BILITOT 0.4 09/09/2018   HM: Due for mammogram. She will call to schedule. Breast center at [facility] imaging.   History Patient Active Problem List   Diagnosis Date Noted  . Distal radius fracture, right 02/14/2012  . Hyperlipidemia 09/13/2011  . HYPERTENSION 01/15/2008  . 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 DYSPNEA 01/15/2008  . PALPITATIONS, HX OF 01/15/2008   Past Medical History:  Diagnosis Date  . GERD (gastroesophageal reflux disease)    spasmodic dysphonia  . Heart murmur    resolved- related to stress of throat closing , echo  done 2009  . Hepatitis    hep- a as a child  . Hyperlipidemia   . Hypertension    PCP manages htn, pt. also reports that she has seen Cardiology  at [facility] too.   Marland Kitchen Neuromuscular disorder (HCC)    spasmodic dysphonia   . 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  COLONOSCOPY    . FRACTURE SURGERY    . OPEN REDUCTION INTERNAL FIXATION (ORIF) DISTAL RADIAL FRACTURE  02/14/2012   Procedure: OPEN REDUCTION INTERNAL FIXATION (ORIF) DISTAL RADIAL FRACTURE;  Surgeon: Mcarthur Rossetti, MD;  Location: [place];  Service: Orthopedics;  Laterality: Right;  Open Reduction Internal Fixation Right Wrist  . ORIF ANKLE FRACTURE Left 06/30/2019   Procedure: OPEN REDUCTION INTERNAL FIXATION (ORIF) LEFT ANKLE FRACTURE;  Surgeon: Meredith Pel, MD;  Location: [facility];  Service: Orthopedics;  Laterality: Left;  . TONSILLECTOMY     2009   No Known Allergies Prior to Admission medications   Medication Sig Start Date End Date Taking? Authorizing  Provider  hydrochlorothiazide (HYDRODIURIL) 25 MG tablet Take 1 tablet (25 mg total) by mouth daily. 09/09/18  Yes [name], MD  LORazepam (ATIVAN) 1 MG tablet Take 1 tablet (1 mg total) by mouth 2 (two) times daily as needed. For throat spasms Patient taking differently: Take 1 mg by mouth 2 (two) times daily as needed (For throat spasms).  09/09/18  Yes [name], MD  metoprolol succinate (TOPROL-XL) 50 MG 24 hr tablet Take 1 tablet (50 mg total) by mouth daily. 09/09/18  Yes [name], MD   Social History   Socioeconomic History  . Marital status: Married    Spouse name: Not on file  . Number of children: 2  . Years of education: Not on file  . Highest education level: Professional school degree (e.g., MD, DDS, DVM, JD)  Occupational History  . Occupation: Software engineer    Comment: retired 2016; now PRN    Employer: [employer]  Tobacco Use  . Smoking status: Never Smoker  . Smokeless tobacco: Never Used  Substance and Sexual Activity  . Alcohol use: Yes    Alcohol/week: 6.0 standard drinks    Types: 6 Glasses of wine per week    Comment: Occasional  . Drug use: No  . Sexual activity: Yes    Birth control/protection: Post-menopausal  Other Topics Concern  . Not on file  Social History Narrative   Industrial/product designer   Married   Exercises   Social Determinants of Health   Financial Resource Strain:   . Difficulty of Paying Living Expenses:   Food Insecurity:   . Worried About Charity fundraiser in the Last Year:   . Arboriculturist in the Last Year:   Transportation Needs:   . Film/video editor (Medical):   Marland Kitchen Lack of Transportation (Non-Medical):   Physical Activity:   . Days of Exercise per Week:   . Minutes of Exercise per Session:   Stress:   . Feeling of Stress :   Social Connections:   . Frequency of Communication with Friends and Family:   . Frequency of Social Gatherings with Friends and Family:   . Attends Religious Services:     . Active Member of Clubs or Organizations:   . Attends Archivist Meetings:   Marland Kitchen Marital Status:   Intimate Partner Violence:   . Fear of Current or Ex-Partner:   . Emotionally Abused:   Marland Kitchen Physically Abused:   . Sexually Abused:     Review of Systems  Constitutional: Negative for fatigue and unexpected weight change (wt gain with pandemic. ).  Respiratory: Negative for chest tightness and shortness of breath.   Cardiovascular: Negative for chest pain, palpitations and leg swelling.  Gastrointestinal: Negative for abdominal pain and blood in stool.  Neurological: Negative for dizziness, syncope, light-headedness and headaches.     Objective:   Vitals:   09/04/19 1056 09/04/19 1105  BP: (!) 144/86 140/82  Pulse: 64   Temp: 98 F (36.7 C)   TempSrc: Temporal   SpO2: 95%   Weight: 178 lb (80.7 kg)   Height: 5\' 4"  (1.626 m)      Physical Exam Vitals reviewed.  Constitutional:      Appearance: She is well-developed.  HENT:     Head: Normocephalic and atraumatic.  Eyes:     Conjunctiva/sclera: Conjunctivae normal.     Pupils: Pupils are equal, round, and reactive to light.  Neck:     Vascular: No carotid bruit.  Cardiovascular:     Rate and Rhythm: Normal rate and regular rhythm.     Heart sounds: Normal heart sounds.  Pulmonary:     Effort: Pulmonary effort is normal.     Breath sounds: Normal breath sounds.  Abdominal:     Palpations: Abdomen is soft. There is no pulsatile mass.     Tenderness: There is no abdominal tenderness.  Skin:    General: Skin is warm and dry.  Neurological:     Mental Status: She is alert and oriented to person, place, and time.  Psychiatric:        Behavior: Behavior normal.        Assessment & Plan:  Erin Mckee is a 74 y.o. female . Hyperlipidemia, unspecified hyperlipidemia type - Plan: Comprehensive metabolic panel, Lipid panel  -Repeat testing, did recommend statin previously.  Could consider intermittent  dosing such as once per week for a few days per week initially for tolerability.  We will wait on labs first.  Plans on weight loss.  Essential hypertension - Plan: hydrochlorothiazide (HYDRODIURIL) 25 MG tablet, metoprolol succinate (TOPROL-XL) 50 MG 24 hr tablet, Comprehensive metabolic panel  -Borderline in office but home readings okay.  Continue same regimen.  RTC precautions.  Labs pending  Dysphonia - Plan: LORazepam (ATIVAN) 1 MG tablet Vocal cord dysfunction - Plan: LORazepam (ATIVAN) 1 MG tablet  -Stable with intermittent dosing of lorazepam.  Continue same.  Meds ordered this encounter  Medications  . hydrochlorothiazide (HYDRODIURIL) 25 MG tablet    Sig: Take 1 tablet (25 mg total) by mouth daily.    Dispense:  90 tablet    Refill:  3  . LORazepam (ATIVAN) 1 MG tablet    Sig: Take 1 tablet (1 mg total) by mouth 2 (two) times daily as needed. For throat spasms    Dispense:  60 tablet    Refill:  1  . metoprolol succinate (TOPROL-XL) 50 MG 24 hr tablet    Sig: Take 1 tablet (50 mg total) by mouth daily.    Dispense:  90 tablet    Refill:  3   Patient Instructions    If cholesterol is elevated and elevated 10-year heart disease risk score and still would recommend some form of statin even if intermittent dosing.  Can discuss further once we receive your labs.  No med changes for now.   If you have lab work done today you will be contacted with your lab results within the next 2 weeks.  If you have not heard from [place] then please contact us. The fastest way to get your results is to register for My Chart.   IF you received an x-ray today, you will receive an invoice from [facility]. Please contact [facility] at [phone] with  questions or concerns regarding your invoice.   IF you received labwork today, you will receive an invoice from [facility]. Please contact [facility] at [phone] with questions or concerns regarding your invoice.   Our billing  staff will not be able to assist you with questions regarding bills from these companies.  You will be contacted with the lab results as soon as they are available. The fastest way to get your results is to activate your My Chart account. Instructions are located on the last page of this paperwork. If you have not heard from [place] regarding the results in 2 weeks, please contact this office.         Signed, Merri Ray, MD [facility]

## 2019-09-04 NOTE — Patient Instructions (Addendum)
  If cholesterol is elevated and elevated 10-year heart disease risk score and still would recommend some form of statin even if intermittent dosing.  Can discuss further once we receive your labs.  No med changes for now.   If you have lab work done today you will be contacted with your lab results within the next 2 weeks.  If you have not heard from [place] then please contact us. The fastest way to get your results is to register for My Chart.   IF you received an x-ray today, you will receive an invoice from [facility]. Please contact [facility] at [phone] with questions or concerns regarding your invoice.   IF you received labwork today, you will receive an invoice from [facility]. Please contact [facility] at [phone] with questions or concerns regarding your invoice.   Our billing staff will not be able to assist you with questions regarding bills from these companies.  You will be contacted with the lab results as soon as they are available. The fastest way to get your results is to activate your My Chart account. Instructions are located on the last page of this paperwork. If you have not heard from [place] regarding the results in 2 weeks, please contact this office.

## 2019-09-05 LAB — LIPID PANEL
Chol/HDL Ratio: 4.7 ratio — ABNORMAL HIGH (ref 0.0–4.4)
Cholesterol, Total: 307 mg/dL — ABNORMAL HIGH (ref 100–199)
HDL: 65 mg/dL (ref >39)
LDL Chol Calc (NIH): 192 mg/dL — ABNORMAL HIGH (ref 0–99)
Triglycerides: 259 mg/dL — ABNORMAL HIGH (ref 0–149)
VLDL Cholesterol Cal: 50 mg/dL — ABNORMAL HIGH (ref 5–40)

## 2019-09-05 LAB — COMPREHENSIVE METABOLIC PANEL
ALT: 20 IU/L (ref 0–32)
AST: 18 IU/L (ref 0–40)
Albumin/Globulin Ratio: 2 (ref 1.2–2.2)
Albumin: 4.7 g/dL (ref 3.7–4.7)
Alkaline Phosphatase: 98 IU/L (ref 48–121)
BUN/Creatinine Ratio: 13 (ref 12–28)
BUN: 10 mg/dL (ref 8–27)
Bilirubin Total: 0.5 mg/dL (ref 0.0–1.2)
CO2: 24 mmol/L (ref 20–29)
Calcium: 10.2 mg/dL (ref 8.7–10.3)
Chloride: 101 mmol/L (ref 96–106)
Creatinine, Ser: 0.75 mg/dL (ref 0.57–1.00)
GFR calc Af Amer: 91 mL/min/{1.73_m2} (ref >59)
GFR calc non Af Amer: 79 mL/min/{1.73_m2} (ref >59)
Globulin, Total: 2.4 g/dL (ref 1.5–4.5)
Glucose: 94 mg/dL (ref 65–99)
Potassium: 3.9 mmol/L (ref 3.5–5.2)
Sodium: 139 mmol/L (ref 134–144)
Total Protein: 7.1 g/dL (ref 6.0–8.5)

## 2019-09-10 ENCOUNTER — Encounter: Payer: Self-pay | Admitting: Orthopedic Surgery

## 2019-09-10 NOTE — Progress Notes (Signed)
Office Visit Note   Patient: Erin Mckee           Date of Birth: 04/21/1945           MRN: [id number] Visit Date: 09/03/2019              Requested by: [name], MD [address] [place],  [place] [address] PCP: [name], MD  Chief Complaint  Patient presents with  . Left Ankle - Routine Post Op    06/30/19 s/p left ankle fx       HPI: Patient is a 74 year old woman who is 2 months status post open reduction internal fixation left ankle fracture she states she has no concerns she is going back to work this weekend has some minor swelling.  Assessment & Plan: Visit Diagnoses:  1. 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 Bimalleolar ankle fracture, left, closed, initial encounter     Plan: Recommended compression stockings continue to work on ankle range of motion and strengthening.  Follow-Up Instructions: Return if symptoms worsen or fail to improve.   Ortho Exam  Patient is alert, oriented, no adenopathy, well-dressed, normal affect, normal respiratory effort. Examination patient does have some swelling there is no redness no cellulitis no signs of infection.  The medial incision is well-healed.  Imaging: No results found. No images are attached to the encounter.  Labs: Lab Results  Component Value Date   HGBA1C 5.3 09/12/2017     Lab Results  Component Value Date   ALBUMIN 4.7 09/04/2019   ALBUMIN 4.7 09/09/2018   ALBUMIN 4.7 09/12/2017    No results found for: MG No results found for: VD25OH  No results found for: PREALBUMIN CBC EXTENDED Latest Ref Rng & Units 06/30/2019 04/26/2017 10/05/2016  WBC 4.0 - 10.5 K/uL 9.2 6.6 6.7  RBC 3.87 - 5.11 MIL/uL 4.62 4.89 4.66  HGB 12.0 - 15.0 g/dL 14.2 14.7 14.3  HCT 36 - 46 % 42.3 44.0 41.8  PLT 150 - 400 K/uL 226 257 238  NEUTROABS 1 - 7 x10E3/uL - 4.1 3.9  LYMPHSABS 0 - 3 x10E3/uL - 1.6 2.3     Body mass index is 29.18  kg/m.  Orders:  No orders of the defined types were placed in this encounter.  No orders of the defined types were placed in this encounter.    Procedures: No procedures performed  Clinical Data: No additional findings.  ROS:  All other systems negative, except as noted in the HPI. Review of Systems  Objective: Vital Signs: Ht 5' 4"  (1.626 m)   Wt 170 lb (77.1 kg)   BMI 29.18 kg/m   Specialty Comments:  No specialty comments available.  PMFS History: Patient Active Problem List   Diagnosis Date Noted  . Distal radius fracture, right 02/14/2012  . Hyperlipidemia 09/13/2011  . HYPERTENSION 01/15/2008  . 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 DYSPNEA 01/15/2008  . PALPITATIONS, HX OF 01/15/2008   Past Medical History:  Diagnosis Date  . GERD (gastroesophageal reflux disease)    spasmodic dysphonia  . Heart murmur    resolved- related to stress of throat closing , echo  done 2009  . Hepatitis    hep- a as a child  . Hyperlipidemia   . Hypertension    PCP manages htn, pt. also reports that she has seen Cardiology  at [facility] too.   Marland Kitchen Neuromuscular disorder ([place])  spasmodic dysphonia   . 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 Hypertension Mother   . Heart disease Mother 35       CAD/CABG  . Hyperlipidemia Mother   . Hypertension Father   . Heart disease Father        AMI  . Hyperlipidemia Father   . Multiple myeloma Father   . Hypertension Sister   . Thyroid disease Sister   . Hypertension Brother   . Crohn's disease Brother   . Cerebral palsy Son   . Hydrocephalus Son   . Colon cancer Neg Hx     Past Surgical History:  Procedure Laterality Date  . COLONOSCOPY    . FRACTURE SURGERY    . OPEN REDUCTION INTERNAL FIXATION (ORIF) DISTAL RADIAL FRACTURE  02/14/2012   Procedure: OPEN REDUCTION INTERNAL FIXATION (ORIF) DISTAL RADIAL FRACTURE;  Surgeon: Mcarthur Rossetti, MD;  Location:  [facility];  Service: Orthopedics;  Laterality: Right;  Open Reduction Internal Fixation Right Wrist  . ORIF ANKLE FRACTURE Left 06/30/2019   Procedure: OPEN REDUCTION INTERNAL FIXATION (ORIF) LEFT ANKLE FRACTURE;  Surgeon: Meredith Pel, MD;  Location: [facility];  Service: Orthopedics;  Laterality: Left;  . TONSILLECTOMY     2009   Social History   Occupational History  . Occupation: Software engineer    Comment: retired 2016; now PRN    Employer: [employer]  Tobacco Use  . Smoking status: Never Smoker  . Smokeless tobacco: Never Used  Vaping Use  . Vaping Use: Never used  Substance and Sexual Activity  . Alcohol use: Yes    Alcohol/week: 6.0 standard drinks    Types: 6 Glasses of wine per week    Comment: Occasional  . Drug use: No  . Sexual activity: Yes    Birth control/protection: Post-menopausal

## 2019-09-16 ENCOUNTER — Encounter: Payer: Self-pay | Admitting: Family Medicine

## 2019-09-16 NOTE — Telephone Encounter (Signed)
Message from patient routed to Dr Carlota Raspberry and he messaged back to me, ok thanks.

## 2019-09-23 DIAGNOSIS — H02831 Dermatochalasis of right upper eyelid: Secondary | ICD-10-CM | POA: Diagnosis not present

## 2019-09-23 DIAGNOSIS — H2513 Age-related nuclear cataract, bilateral: Secondary | ICD-10-CM | POA: Diagnosis not present

## 2019-09-23 DIAGNOSIS — H02834 Dermatochalasis of left upper eyelid: Secondary | ICD-10-CM | POA: Diagnosis not present

## 2019-09-23 DIAGNOSIS — D23111 Other benign neoplasm of skin of right upper eyelid, including canthus: Secondary | ICD-10-CM | POA: Diagnosis not present

## 2019-09-24 DIAGNOSIS — H2512 Age-related nuclear cataract, left eye: Secondary | ICD-10-CM | POA: Diagnosis not present

## 2019-09-24 DIAGNOSIS — H25812 Combined forms of age-related cataract, left eye: Secondary | ICD-10-CM | POA: Diagnosis not present

## 2019-09-24 DIAGNOSIS — H21562 Pupillary abnormality, left eye: Secondary | ICD-10-CM | POA: Diagnosis not present

## 2020-01-14 DIAGNOSIS — H2511 Age-related nuclear cataract, right eye: Secondary | ICD-10-CM | POA: Diagnosis not present

## 2020-01-14 DIAGNOSIS — H2512 Age-related nuclear cataract, left eye: Secondary | ICD-10-CM | POA: Diagnosis not present

## 2020-01-14 DIAGNOSIS — H25811 Combined forms of age-related cataract, right eye: Secondary | ICD-10-CM | POA: Diagnosis not present

## 2020-04-19 DIAGNOSIS — D23111 Other benign neoplasm of skin of right upper eyelid, including canthus: Secondary | ICD-10-CM | POA: Diagnosis not present

## 2020-04-19 DIAGNOSIS — D23122 Other benign neoplasm of skin of left lower eyelid, including canthus: Secondary | ICD-10-CM | POA: Diagnosis not present

## 2020-04-19 DIAGNOSIS — D23121 Other benign neoplasm of skin of left upper eyelid, including canthus: Secondary | ICD-10-CM | POA: Diagnosis not present

## 2020-04-19 DIAGNOSIS — D23112 Other benign neoplasm of skin of right lower eyelid, including canthus: Secondary | ICD-10-CM | POA: Diagnosis not present

## 2020-08-15 ENCOUNTER — Encounter: Payer: Self-pay | Admitting: Family Medicine

## 2020-08-15 ENCOUNTER — Ambulatory Visit (INDEPENDENT_AMBULATORY_CARE_PROVIDER_SITE_OTHER): Payer: Medicare Other | Admitting: Family Medicine

## 2020-08-15 ENCOUNTER — Other Ambulatory Visit: Payer: Self-pay

## 2020-08-15 VITALS — BP 134/72 | HR 63 | Temp 98.1°F | Resp 16 | Ht 64.0 in | Wt 175.8 lb

## 2020-08-15 DIAGNOSIS — E785 Hyperlipidemia, unspecified: Secondary | ICD-10-CM

## 2020-08-15 DIAGNOSIS — R490 Dysphonia: Secondary | ICD-10-CM

## 2020-08-15 DIAGNOSIS — J383 Other diseases of vocal cords: Secondary | ICD-10-CM

## 2020-08-15 DIAGNOSIS — I10 Essential (primary) hypertension: Secondary | ICD-10-CM | POA: Diagnosis not present

## 2020-08-15 LAB — LIPID PANEL
Cholesterol: 260 mg/dL — ABNORMAL HIGH (ref 0–200)
HDL: 62.6 mg/dL (ref >39.00)
LDL Cholesterol: 162 mg/dL — ABNORMAL HIGH (ref 0–99)
NonHDL: 197.08
Total CHOL/HDL Ratio: 4
Triglycerides: 175 mg/dL — ABNORMAL HIGH (ref 0.0–149.0)
VLDL: 35 mg/dL (ref 0.0–40.0)

## 2020-08-15 LAB — COMPREHENSIVE METABOLIC PANEL
ALT: 23 U/L (ref 0–35)
AST: 18 U/L (ref 0–37)
Albumin: 4.6 g/dL (ref 3.5–5.2)
Alkaline Phosphatase: 85 U/L (ref 39–117)
BUN: 16 mg/dL (ref 6–23)
CO2: 26 mEq/L (ref 19–32)
Calcium: 9.9 mg/dL (ref 8.4–10.5)
Chloride: 103 mEq/L (ref 96–112)
Creatinine, Ser: 0.74 mg/dL (ref 0.40–1.20)
GFR: 79.4 mL/min (ref >60.00)
Glucose, Bld: 97 mg/dL (ref 70–99)
Potassium: 3.6 mEq/L (ref 3.5–5.1)
Sodium: 139 mEq/L (ref 135–145)
Total Bilirubin: 0.6 mg/dL (ref 0.2–1.2)
Total Protein: 7 g/dL (ref 6.0–8.3)

## 2020-08-15 MED ORDER — ATORVASTATIN CALCIUM 10 MG PO TABS: 10.0000 mg | ORAL_TABLET | Freq: Every day | ORAL | 1 refills | Status: DC

## 2020-08-15 MED ORDER — METOPROLOL SUCCINATE ER 50 MG PO TB24: 50.0000 mg | ORAL_TABLET | Freq: Every day | ORAL | 3 refills | Status: DC

## 2020-08-15 MED ORDER — HYDROCHLOROTHIAZIDE 25 MG PO TABS: 25.0000 mg | ORAL_TABLET | Freq: Every day | ORAL | 3 refills | Status: DC

## 2020-08-15 MED ORDER — LORAZEPAM 1 MG PO TABS: 0.5000 mg | ORAL_TABLET | Freq: Two times a day (BID) | ORAL | 1 refills | Status: DC | PRN

## 2020-08-15 NOTE — Progress Notes (Signed)
Subjective:  Patient ID: Erin Mckee, female    DOB: 03-04-1946  Age: 75 y.o. MRN: [id number]  CC:  Chief Complaint  Patient presents with  . Hypertension    Pt reports does take BP at home notes she has mostly in range numbers, has some highs when she is busy or has more going on than normal.   . Hoarse    Pt uses ativan for vocal cord issues, pt reports works well is just in need of a refill today     HPI Erin Mckee presents for   Hypertension: Hydrochlorothiazide 25 mg daily, Toprol-XL 50 mg daily Home readings: 110/70 at times, to 130/80.  No new side effects with meds.  Vision better with cataract surgery in June and November last year.   BP Readings from Last 3 Encounters:  08/15/20 134/72  09/04/19 140/82  07/07/19 134/76   Lab Results  Component Value Date   CREATININE 0.75 09/04/2019   Hyperlipidemia: Statin recommended previously. last visit in June 2021.  Has not started statin - not sent in last visit , but unsure she wants to take one.  Lab Results  Component Value Date   CHOL 307 (H) 09/04/2019   HDL 65 09/04/2019   LDLCALC 192 (H) 09/04/2019   TRIG 259 (H) 09/04/2019   CHOLHDL 4.7 (H) 09/04/2019   Lab Results  Component Value Date   ALT 20 09/04/2019   AST 18 09/04/2019   ALKPHOS 98 09/04/2019   BILITOT 0.5 09/04/2019    Dysphonia with vocal cord dysfunction See prior work-up/notes.  Ultimately has done well with episodic use of Ativan 1 mg - 1/2 pill usually during flares.  We have discussed avoidance of narcotic pain medications and benzodiazepines.  Appropriate use previously.Controlled substance database (PDMP) reviewed. No concerns appreciated.  Last prescription November 28, 2019 for #60.  Stable - needs at work at times.   Health maintenance COVID-19 vaccine booster: end of January.  Mammogram: plans on scheduling.  Colonoscopy: Last colonoscopy in July 2016.  Polyps noted.  Repeat 5 years. Received new letter from GI and advised 7  years, not 5.   History Patient Active Problem List   Diagnosis Date Noted  . Distal radius fracture, right 02/14/2012  . Hyperlipidemia 09/13/2011  . HYPERTENSION 01/15/2008  . 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 DYSPNEA 01/15/2008  . PALPITATIONS, HX OF 01/15/2008   Past Medical History:  Diagnosis Date  . GERD (gastroesophageal reflux disease)    spasmodic dysphonia  . Heart murmur    resolved- related to stress of throat closing , echo  done 2009  . Hepatitis    hep- a as a child  . Hyperlipidemia   . Hypertension    PCP manages htn, pt. also reports that she has seen Cardiology  at [facility] too.   Marland Kitchen Neuromuscular disorder (HCC)    spasmodic dysphonia   . 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 COLONOSCOPY    . FRACTURE SURGERY    . OPEN REDUCTION INTERNAL FIXATION (ORIF) DISTAL RADIAL FRACTURE  02/14/2012   Procedure: OPEN REDUCTION INTERNAL FIXATION (ORIF) DISTAL RADIAL FRACTURE;  Surgeon: Mcarthur Rossetti, MD;  Location: [place];  Service: Orthopedics;  Laterality: Right;  Open Reduction Internal Fixation Right Wrist  . ORIF ANKLE FRACTURE Left 06/30/2019   Procedure: OPEN REDUCTION INTERNAL FIXATION (ORIF) LEFT ANKLE FRACTURE;  Surgeon: Meredith Pel, MD;  Location: [place];  Service: Orthopedics;  Laterality: Left;  . TONSILLECTOMY     2009   No Known Allergies Prior to Admission medications   Medication Sig Start Date End Date Taking? Authorizing Provider  hydrochlorothiazide (HYDRODIURIL) 25 MG tablet Take 1 tablet (25 mg total) by mouth daily. 09/04/19  Yes [name], MD  LORazepam (ATIVAN) 1 MG tablet Take 1 tablet (1 mg total) by mouth 2 (two) times daily as needed. For throat spasms 09/04/19  Yes [name], MD  metoprolol succinate (TOPROL-XL) 50 MG 24 hr tablet Take 1 tablet (50 mg total) by mouth daily. 09/04/19  Yes [name], MD   Social History   Socioeconomic  History  . Marital status: Married    Spouse name: Not on file  . Number of children: 2  . Years of education: Not on file  . Highest education level: Professional school degree (e.g., MD, DDS, DVM, JD)  Occupational History  . Occupation: Software engineer    Comment: retired 2016; now PRN    Employer: [employer]  Tobacco Use  . Smoking status: Never Smoker  . Smokeless tobacco: Never Used  Vaping Use  . Vaping Use: Never used  Substance and Sexual Activity  . Alcohol use: Yes    Alcohol/week: 6.0 standard drinks    Types: 6 Glasses of wine per week    Comment: Occasional  . Drug use: No  . Sexual activity: Yes    Birth control/protection: Post-menopausal  Other Topics Concern  . Not on file  Social History Narrative   Industrial/product designer   Married   Exercises   Social Determinants of Health   Financial Resource Strain: Not on file  Food Insecurity: Not on file  Transportation Needs: Not on file  Physical Activity: Not on file  Stress: Not on file  Social Connections: Not on file  Intimate Partner Violence: Not on file    Review of Systems  Constitutional: Negative for fatigue and unexpected weight change.  Respiratory: Negative for chest tightness and shortness of breath.   Cardiovascular: Negative for chest pain, palpitations and leg swelling.  Gastrointestinal: Negative for abdominal pain and blood in stool.  Neurological: Negative for dizziness, syncope, light-headedness and headaches.     Objective:   Vitals:   08/15/20 0818  BP: 134/72  Pulse: 63  Resp: 16  Temp: 98.1 F (36.7 C)  TempSrc: Temporal  SpO2: 95%  Weight: 175 lb 12.8 oz (79.7 kg)  Height: 5\' 4"  (1.626 m)     Physical Exam Vitals reviewed.  Constitutional:      Appearance: She is well-developed.  HENT:     Head: Normocephalic and atraumatic.  Eyes:     Conjunctiva/sclera: Conjunctivae normal.     Pupils: Pupils are equal, round, and reactive to light.  Neck:     Vascular: No  carotid bruit.     Comments: No stridor, phonates normally in office.  Cardiovascular:     Rate and Rhythm: Normal rate and regular rhythm.     Heart sounds: Normal heart sounds.  Pulmonary:     Effort: Pulmonary effort is normal.     Breath sounds: Normal breath sounds.  Abdominal:     Palpations: Abdomen is soft. There is no pulsatile mass.     Tenderness: There is no abdominal tenderness.  Skin:    General: Skin is warm and dry.  Neurological:     Mental Status: She is alert and oriented to person,  place, and time.  Psychiatric:        Behavior: Behavior normal.        Assessment & Plan:  KIMMI ACOCELLA is a 75 y.o. female . Essential hypertension - Plan: hydrochlorothiazide (HYDRODIURIL) 25 MG tablet, metoprolol succinate (TOPROL-XL) 50 MG 24 hr tablet  -  Stable, tolerating current regimen. Medications refilled. Labs pending as above.   Dysphonia - Plan: LORazepam (ATIVAN) 1 MG tablet  -  Stable, tolerating current regimen. Medications refilled. Vocal cord dysfunction - Plan: LORazepam (ATIVAN) 1 MG tablet  Hyperlipidemia, unspecified hyperlipidemia type - Plan: Comprehensive metabolic panel, Lipid panel, atorvastatin (LIPITOR) 10 MG tablet  -Risks and benefits, side effects of statins have been discussed, does agree to trial of statin, initially Lipitor once per week then increase to daily as tolerated, coq.10 supplement okay as well.  Recheck with  LFTs with lab only visit in 6 weeks.  Meds ordered this encounter  Medications  . hydrochlorothiazide (HYDRODIURIL) 25 MG tablet    Sig: Take 1 tablet (25 mg total) by mouth daily.    Dispense:  90 tablet    Refill:  3  . metoprolol succinate (TOPROL-XL) 50 MG 24 hr tablet    Sig: Take 1 tablet (50 mg total) by mouth daily.    Dispense:  90 tablet    Refill:  3  . LORazepam (ATIVAN) 1 MG tablet    Sig: Take 0.5-1 tablets (0.5-1 mg total) by mouth 2 (two) times daily as needed. For throat spasms    Dispense:  60  tablet    Refill:  1  . atorvastatin (LIPITOR) 10 MG tablet    Sig: Take 1 tablet (10 mg total) by mouth daily.    Dispense:  90 tablet    Refill:  1   Patient Instructions  Depending on cholesterol levels, I would recommend trying statin. If levels still elevated, try Lipitor once per week initially then increase to daily as tolerated. CoQ10 supplement ok to use as well.   No other med changes at this time.  Thanks for coming in today and let me know if there are questions.         Signed, Merri Ray, MD [facility]

## 2020-08-15 NOTE — Patient Instructions (Addendum)
Depending on cholesterol levels, I would recommend trying statin. If levels still elevated, try Lipitor once per week initially then increase to daily as tolerated. CoQ10 supplement ok to use as well.   No other med changes at this time.  Thanks for coming in today and let me know if there are questions.

## 2020-09-26 ENCOUNTER — Other Ambulatory Visit: Payer: Medicare Other

## 2021-02-09 ENCOUNTER — Ambulatory Visit: Payer: Medicare Other

## 2021-02-13 ENCOUNTER — Ambulatory Visit: Payer: Medicare Other

## 2021-02-13 ENCOUNTER — Ambulatory Visit (INDEPENDENT_AMBULATORY_CARE_PROVIDER_SITE_OTHER): Payer: Medicare Other | Admitting: Family Medicine

## 2021-02-13 ENCOUNTER — Encounter: Payer: Self-pay | Admitting: Family Medicine

## 2021-02-13 VITALS — BP 136/68 | HR 60 | Temp 98.2°F | Resp 16 | Ht 64.0 in | Wt 179.8 lb

## 2021-02-13 DIAGNOSIS — R490 Dysphonia: Secondary | ICD-10-CM

## 2021-02-13 DIAGNOSIS — I10 Essential (primary) hypertension: Secondary | ICD-10-CM

## 2021-02-13 DIAGNOSIS — J383 Other diseases of vocal cords: Secondary | ICD-10-CM | POA: Diagnosis not present

## 2021-02-13 DIAGNOSIS — E785 Hyperlipidemia, unspecified: Secondary | ICD-10-CM | POA: Diagnosis not present

## 2021-02-13 LAB — COMPREHENSIVE METABOLIC PANEL
ALT: 27 U/L (ref 0–35)
AST: 20 U/L (ref 0–37)
Albumin: 4.6 g/dL (ref 3.5–5.2)
Alkaline Phosphatase: 88 U/L (ref 39–117)
BUN: 16 mg/dL (ref 6–23)
CO2: 31 mEq/L (ref 19–32)
Calcium: 9.9 mg/dL (ref 8.4–10.5)
Chloride: 100 mEq/L (ref 96–112)
Creatinine, Ser: 0.85 mg/dL (ref 0.40–1.20)
GFR: 67 mL/min (ref >60.00)
Glucose, Bld: 101 mg/dL — ABNORMAL HIGH (ref 70–99)
Potassium: 4.3 mEq/L (ref 3.5–5.1)
Sodium: 138 mEq/L (ref 135–145)
Total Bilirubin: 0.6 mg/dL (ref 0.2–1.2)
Total Protein: 6.9 g/dL (ref 6.0–8.3)

## 2021-02-13 LAB — LIPID PANEL
Cholesterol: 202 mg/dL — ABNORMAL HIGH (ref 0–200)
HDL: 62.8 mg/dL (ref >39.00)
LDL Cholesterol: 105 mg/dL — ABNORMAL HIGH (ref 0–99)
NonHDL: 138.72
Total CHOL/HDL Ratio: 3
Triglycerides: 168 mg/dL — ABNORMAL HIGH (ref 0.0–149.0)
VLDL: 33.6 mg/dL (ref 0.0–40.0)

## 2021-02-13 MED ORDER — ATORVASTATIN CALCIUM 10 MG PO TABS: 10.0000 mg | ORAL_TABLET | Freq: Every day | ORAL | 1 refills | Status: DC

## 2021-02-13 MED ORDER — METOPROLOL SUCCINATE ER 50 MG PO TB24: 50.0000 mg | ORAL_TABLET | Freq: Every day | ORAL | 3 refills | Status: DC

## 2021-02-13 MED ORDER — LORAZEPAM 1 MG PO TABS: 0.5000 mg | ORAL_TABLET | Freq: Two times a day (BID) | ORAL | 1 refills | Status: DC | PRN

## 2021-02-13 MED ORDER — HYDROCHLOROTHIAZIDE 25 MG PO TABS: 25.0000 mg | ORAL_TABLET | Freq: Every day | ORAL | 3 refills | Status: DC

## 2021-02-13 NOTE — Patient Instructions (Signed)
No change in meds for now.  I will repeat your cholesterol and liver tests today with blood work, continue Lipitor once per day.  Watch diet with portion sizes, avoidance of fried food, sugar containing beverages.  Let me know if there are questions and take care.

## 2021-02-13 NOTE — Progress Notes (Signed)
Subjective:  Patient ID: Erin Mckee, female    DOB: 03-15-1946  Age: 75 y.o. MRN: [id number]  CC:  Chief Complaint  Patient presents with   Hypertension    Pt reports doing okay, pt denies physical sxs, pt reports she does not take home BP    Hyperlipidemia    Pt reports had not started atarvastatin until apx 6 weeks ago due to forgetting medication while she was away at [place]    Medication Refill    Pt will need refill ativan soon, uses for dysphonia notes no concerns     HPI Erin Mckee presents for   Hypertension: Hctz 25 mg daily, Toprol-XL 50 mg daily. Doing well - enjoying retirement. Most of the time at [facility]. [place]. Has 5 horses, frequent riding, camping. Plans on weight loss - weight has to be under 150 to ride in [place] in June 2023. Occasional work for [employer], [employer].  Home readings: none.  BP Readings from Last 3 Encounters:  02/13/21 136/68  08/15/20 134/72  09/04/19 140/82   Lab Results  Component Value Date   CREATININE 0.74 08/15/2020   Hyperlipidemia: Recommended with prior labs and significant elevation of LDL.  Lipitor 10 mg started.  She has not started that medication until approximately past 6 weeks, taking daily, Denies new myalgias/side effects. [place] less diet adherence [place]   Lab Results  Component Value Date   CHOL 260 (H) 08/15/2020   HDL 62.60 08/15/2020   LDLCALC 162 (H) 08/15/2020   TRIG 175.0 (H) 08/15/2020   CHOLHDL 4 08/15/2020   Lab Results  Component Value Date   ALT 23 08/15/2020   AST 18 08/15/2020   ALKPHOS 85 08/15/2020   BILITOT 0.6 08/15/2020   Dysphonia with vocal cord dysfunction See previous work-up, ultimately has done best with intermittent use of Ativan, one half of the lorazepam 1 mg during flares.  Controlled substance database reviewed, last prescription #30 on 08/15/2020. Still taking prior to work only usually. Needs refill soon.   History Patient Active  Problem List   Diagnosis Date Noted   Distal radius fracture, right 02/14/2012   Hyperlipidemia 09/13/2011   HYPERTENSION 01/15/2008   VOCAL CORD DISORDER 01/15/2008   DYSPNEA 01/15/2008   PALPITATIONS, HX OF 01/15/2008   Past Medical History:  Diagnosis Date   GERD (gastroesophageal reflux disease)    spasmodic dysphonia   Heart murmur    resolved- related to stress of throat closing , echo  done 2009   Hepatitis    hep- a as a child   Hyperlipidemia    Hypertension    PCP manages htn, pt. also reports that she has seen Cardiology  at [facility] too.    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   Past Surgical History:  Procedure Laterality Date   COLONOSCOPY     FRACTURE SURGERY     OPEN REDUCTION INTERNAL FIXATION (ORIF) DISTAL RADIAL FRACTURE  02/14/2012   Procedure: OPEN REDUCTION INTERNAL FIXATION (ORIF) DISTAL RADIAL FRACTURE;  Surgeon: Mcarthur Rossetti, MD;  Location: [place];  Service: Orthopedics;  Laterality: Right;  Open Reduction Internal Fixation Right Wrist   ORIF ANKLE FRACTURE Left 06/30/2019   Procedure: OPEN REDUCTION INTERNAL FIXATION (ORIF) LEFT ANKLE FRACTURE;  Surgeon: Meredith Pel, MD;  Location: [place];  Service: Orthopedics;  Laterality: Left;   TONSILLECTOMY  2009   No Known Allergies Prior to Admission medications   Medication Sig Start Date End Date Taking? Authorizing Provider  atorvastatin (LIPITOR) 10 MG tablet Take 1 tablet (10 mg total) by mouth daily. 08/15/20  Yes [name], MD  hydrochlorothiazide (HYDRODIURIL) 25 MG tablet Take 1 tablet (25 mg total) by mouth daily. 08/15/20  Yes [name], MD  LORazepam (ATIVAN) 1 MG tablet Take 0.5-1 tablets (0.5-1 mg total) by mouth 2 (two) times daily as needed. For throat spasms 08/15/20  Yes [name], MD  metoprolol succinate (TOPROL-XL) 50 MG 24 hr tablet Take 1 tablet (50 mg total) by mouth daily.  08/15/20  Yes [name], MD   Social History   Socioeconomic History   Marital status: Married    Spouse name: Not on file   Number of children: 2   Years of education: Not on file   Highest education level: Professional school degree (e.g., MD, DDS, DVM, JD)  Occupational History   Occupation: pharmacist    Comment: retired 2016; now PRN    Employer: [employer]  Tobacco Use   Smoking status: Never   Smokeless tobacco: Never  Vaping Use   Vaping Use: Never used  Substance and Sexual Activity   Alcohol use: Yes    Alcohol/week: 6.0 standard drinks    Types: 6 Glasses of wine per week    Comment: Occasional   Drug use: No   Sexual activity: Yes    Birth control/protection: Post-menopausal  Other Topics Concern   Not on file  Social History Narrative   Industrial/product designer   Married   Exercises   Social Determinants of Health   Financial Resource Strain: Not on file  Food Insecurity: Not on file  Transportation Needs: Not on file  Physical Activity: Not on file  Stress: Not on file  Social Connections: Not on file  Intimate Partner Violence: Not on file   Review of Systems  Constitutional:  Negative for fatigue and unexpected weight change.  Respiratory:  Negative for chest tightness and shortness of breath.   Cardiovascular:  Negative for chest pain, palpitations and leg swelling.  Gastrointestinal:  Negative for abdominal pain and blood in stool.  Neurological:  Negative for dizziness, syncope, light-headedness and headaches.    Objective:   Vitals:   02/13/21 0905  BP: 136/68  Pulse: 60  Resp: 16  Temp: 98.2 F (36.8 C)  TempSrc: Temporal  SpO2: 96%  Weight: 179 lb 12.8 oz (81.6 kg)  Height: 5\' 4"  (1.626 m)     Physical Exam Vitals reviewed.  Constitutional:      Appearance: Normal appearance. She is well-developed.  HENT:     Head: Normocephalic and atraumatic.  Eyes:     Conjunctiva/sclera: Conjunctivae normal.     Pupils: Pupils  are equal, round, and reactive to light.  Neck:     Vascular: No carotid bruit.  Cardiovascular:     Rate and Rhythm: Normal rate and regular rhythm.     Heart sounds: Normal heart sounds.  Pulmonary:     Effort: Pulmonary effort is normal.     Breath sounds: Normal breath sounds.  Abdominal:     Palpations: Abdomen is soft. There is no pulsatile mass.     Tenderness: There is no abdominal tenderness.  Musculoskeletal:     Right lower leg: No edema.     Left lower leg: No edema.  Skin:    General: Skin is  warm and dry.  Neurological:     Mental Status: She is alert and oriented to person, place, and time.  Psychiatric:        Mood and Affect: Mood normal.        Behavior: Behavior normal.       Assessment & Plan:  Erin Mckee is a 75 y.o. female . Hyperlipidemia, unspecified hyperlipidemia type - Plan: Comprehensive metabolic panel, Lipid panel, atorvastatin (LIPITOR) 10 MG tablet  -Tolerating statin, continue same, check labs.  Plan for diet changes/weight loss which may also help.  Essential hypertension - Plan: metoprolol succinate (TOPROL-XL) 50 MG 24 hr tablet, hydrochlorothiazide (HYDRODIURIL) 25 MG tablet  -Borderline, plan for diet changes/weight loss as above.  Continue same dose Toprol, HCTZ, check labs  Dysphonia - Plan: LORazepam (ATIVAN) 1 MG tablet Vocal cord dysfunction - Plan: LORazepam (ATIVAN) 1 MG tablet  -Stable with rare use of lorazepam.  Refilled.  Meds ordered this encounter  Medications   metoprolol succinate (TOPROL-XL) 50 MG 24 hr tablet    Sig: Take 1 tablet (50 mg total) by mouth daily.    Dispense:  90 tablet    Refill:  3   LORazepam (ATIVAN) 1 MG tablet    Sig: Take 0.5-1 tablets (0.5-1 mg total) by mouth 2 (two) times daily as needed. For throat spasms    Dispense:  60 tablet    Refill:  1   hydrochlorothiazide (HYDRODIURIL) 25 MG tablet    Sig: Take 1 tablet (25 mg total) by mouth daily.    Dispense:  90 tablet    Refill:  3    atorvastatin (LIPITOR) 10 MG tablet    Sig: Take 1 tablet (10 mg total) by mouth daily.    Dispense:  90 tablet    Refill:  1   Patient Instructions  No change in meds for now.  I will repeat your cholesterol and liver tests today with blood work, continue Lipitor once per day.  Watch diet with portion sizes, avoidance of fried food, sugar containing beverages.  Let me know if there are questions and take care.    Signed,   Merri Ray, MD [facility] 02/13/21 9:42 AM

## 2021-02-16 ENCOUNTER — Ambulatory Visit (INDEPENDENT_AMBULATORY_CARE_PROVIDER_SITE_OTHER): Payer: Medicare Other

## 2021-02-16 DIAGNOSIS — Z0000 Encounter for general adult medical examination without abnormal findings: Secondary | ICD-10-CM | POA: Diagnosis not present

## 2021-02-16 NOTE — Progress Notes (Signed)
Subjective:   Erin Mckee is a 75 y.o. female who presents for Medicare Annual (Subsequent) preventive examination.  I connected with Loma Newton today by telephone and verified that I am speaking with the correct person using two identifiers. Location patient: home Location provider: work Persons participating in the virtual visit: patient, provider.   I discussed the limitations, risks, security and privacy concerns of performing an evaluation and management service by telephone and the availability of in person appointments. I also discussed with the patient that there may be a patient responsible charge related to this service. The patient expressed understanding and verbally consented to this telephonic visit.    Interactive audio and video telecommunications were attempted between this provider and patient, however failed, due to patient having technical difficulties OR patient did not have access to video capability.  We continued and completed visit with audio only.    Review of Systems     Cardiac Risk Factors include: advanced age (>39mn, >>69women);dyslipidemia;hypertension     Objective:    Today's Vitals   There is no height or weight on file to calculate BMI.  Advanced Directives 02/16/2021 07/07/2019 06/30/2019 04/29/2018 04/26/2017 09/15/2015 10/12/2014  Does Patient Have a Medical Advance Directive? No No No Yes No No No  Type of Advance Directive - - - Living will - - -  Does patient want to make changes to medical advance directive? - - - No - Patient declined - - -  Would patient like information on creating a medical advance directive? No - Patient declined Yes (ED - Information included in AVS) No - Patient declined - Yes (MAU/Ambulatory/Procedural Areas - Information given) Yes - Educational materials given No - patient declined information  Pre-existing out of facility DNR order (yellow form or pink MOST form) - - - - - - -    Current Medications  (verified) Outpatient Encounter Medications as of 02/16/2021  Medication Sig   atorvastatin (LIPITOR) 10 MG tablet Take 1 tablet (10 mg total) by mouth daily.   hydrochlorothiazide (HYDRODIURIL) 25 MG tablet Take 1 tablet (25 mg total) by mouth daily.   LORazepam (ATIVAN) 1 MG tablet Take 0.5-1 tablets (0.5-1 mg total) by mouth 2 (two) times daily as needed. For throat spasms   metoprolol succinate (TOPROL-XL) 50 MG 24 hr tablet Take 1 tablet (50 mg total) by mouth daily.   No facility-administered encounter medications on file as of 02/16/2021.    Allergies (verified) Patient has no known allergies.   History: Past Medical History:  Diagnosis Date   GERD (gastroesophageal reflux disease)    spasmodic dysphonia   Heart murmur    resolved- related to stress of throat closing , echo  done 2009   Hepatitis    hep- a as a child   Hyperlipidemia    Hypertension    PCP manages htn, pt. also [facility]    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   Past Surgical History:  Procedure Laterality Date   COLONOSCOPY     FRACTURE SURGERY     OPEN REDUCTION INTERNAL FIXATION (ORIF) DISTAL RADIAL FRACTURE  02/14/2012   Procedure: OPEN REDUCTION INTERNAL FIXATION (ORIF) DISTAL RADIAL FRACTURE;  Surgeon: CMcarthur Rossetti MD;  [place]  Service: Orthopedics;  Laterality: Right;  Open Reduction Internal Fixation Right Wrist   ORIF ANKLE FRACTURE Left  06/30/2019   Procedure: OPEN REDUCTION INTERNAL FIXATION (ORIF) LEFT ANKLE FRACTURE;  Surgeon: Meredith Pel, MD;  Location: [place];  Service: Orthopedics;  Laterality: Left;   TONSILLECTOMY     2009   Family History  Problem Relation Age of Onset   Hypertension Mother    Heart disease Mother 32       CAD/CABG   Hyperlipidemia Mother    Hypertension Father    Heart disease Father        AMI   Hyperlipidemia Father     Multiple myeloma Father    Hypertension Sister    Thyroid disease Sister    Hypertension Brother    Crohn's disease Brother    Cerebral palsy Son    Hydrocephalus Son    Colon cancer Neg Hx    Social History   Socioeconomic History   Marital status: Married    Spouse name: Not on file   Number of children: 2   Years of education: Not on file   Highest education level: Professional school degree (e.g., MD, DDS, DVM, JD)  Occupational History   Occupation: pharmacist    Comment: retired 2016; now PRN    Employer: [employer]  Tobacco Use   Smoking status: Never   Smokeless tobacco: Never  Vaping Use   Vaping Use: Never used  Substance and Sexual Activity   Alcohol use: Yes    Alcohol/week: 6.0 standard drinks    Types: 6 Glasses of wine per week    Comment: Occasional   Drug use: No   Sexual activity: Yes    Birth control/protection: Post-menopausal  Other Topics Concern   Not on file  Social History Narrative   Industrial/product designer   Married   Exercises   Social Determinants of Health   Financial Resource Strain: Low Risk    Difficulty of Paying Living Expenses: Not hard at all  Food Insecurity: No Food Insecurity   Worried About Charity fundraiser in the Last Year: Never true   Ran Out of Food in the Last Year: Never true  Transportation Needs: No Transportation Needs   Lack of Transportation (Medical): No   Lack of Transportation (Non-Medical): No  Physical Activity: Sufficiently Active   Days of Exercise per Week: 5 days   Minutes of Exercise per Session: 60 min  Stress: No Stress Concern Present   Feeling of Stress : Not at all  Social Connections: Moderately Integrated   Frequency of Communication with Friends and Family: Twice a week   Frequency of Social Gatherings with Friends and Family: Twice a week   Attends Religious Services: Never   Marine scientist or Organizations: Yes   Attends Music therapist: More than 4 times  per year   Marital Status: Married    Tobacco Counseling Counseling given: Not Answered   Clinical Intake:  Pre-visit preparation completed: Yes  Pain : No/denies pain     Nutritional Risks: None Diabetes: No  How often do you need to have someone help you when you read instructions, pamphlets, or other written materials from your doctor or pharmacy?: 1 - Never What is the last grade level you completed in school?: Doctor  Diabetic?no  Interpreter Needed?: No  Information entered by :: [place] of Daily Living In your present state of health, do you have any difficulty performing the following activities: 02/16/2021  Hearing? N  Vision? N  Difficulty concentrating or making decisions? N  Walking or climbing stairs? N  Dressing or bathing? N  Doing errands, shopping? N  Preparing Food and eating ? N  Using the Toilet? N  In the past six months, have you accidently leaked urine? N  Do you have problems with loss of bowel control? N  Managing your Medications? N  Managing your Finances? N  Housekeeping or managing your Housekeeping? N  Some recent data might be hidden    Patient Care Team: [name], MD as PCP - General (Family Medicine)  Indicate any recent Medical Services you may have received from other than [facility] providers in the past year (date may be approximate).     Assessment:   This is a routine wellness examination for [place].  Hearing/Vision screen Vision Screening - Comments:: Annual eye exams   Dietary issues and exercise activities discussed: Current Exercise Habits: Home exercise routine, Type of exercise: walking, Time (Minutes): 60, Frequency (Times/Week): 5, Weekly Exercise (Minutes/Week): 300, Intensity: Mild, Exercise limited by: None identified   Goals Addressed             This Visit's Progress    Exercise 3x per week (30 min per time)   On track    Patient states that she wants to improve her  flexibility and is interested in starting a yoga class.        Depression Screen PHQ 2/9 Scores 02/16/2021 02/16/2021 02/13/2021 08/15/2020 09/04/2019 07/07/2019 09/09/2018  PHQ - 2 Score 0 0 0 0 0 0 0    Fall Risk Fall Risk  02/16/2021 02/13/2021 08/15/2020 09/04/2019 07/07/2019  Falls in the past year? 0 0 0 1 1  Number falls in past yr: 0 0 - 0 0  Injury with Fall? 0 0 - 1 1  Comment - - - - fell walking dog broke ankle  Risk for fall due to : - No Fall Risks - - -  Follow up _0 ;Education provided    FALL RISK PREVENTION PERTAINING TO THE HOME:  Any stairs in or around the home? Yes  If so, are there any without handrails? No  Home free of loose throw rugs in walkways, pet beds, electrical cords, etc? Yes  Adequate lighting in your home to reduce risk of falls? Yes   ASSISTIVE DEVICES UTILIZED TO PREVENT FALLS:  Life alert? No  Use of a cane, walker or w/c? No  Grab bars in the bathroom? No  Shower chair or bench in shower? No  Elevated toilet seat or a handicapped toilet? No    Cognitive Function:  Normal cognitive status assessed by direct observation by this Nurse Health Advisor. No abnormalities found.     6CIT Screen 07/07/2019 04/29/2018 04/26/2017  What Year? 0 points 0 points 0 points  What month? 0 points 0 points 0 points  What time? 0 points 0 points 0 points  Count back from 20 2 points 0 points 0 points  Months in reverse 0 points 0 points 0 points  Repeat phrase 0 points 0 points 0 points  Total Score 2 0 0    Immunizations Immunization History  Administered Date(s) Administered   Hepatitis B 04/14/1997   Influenza Split 01/17/2012, 12/15/2013   PFIZER(Purple Top)SARS-COV-2 Vaccination 04/15/2019, 05/06/2019   Pneumococcal Conjugate-13 02/22/2014   Pneumococcal Polysaccharide-23 04/14/2012   Td 09/09/2018   Tdap 04/02/2008    Zoster, Live 02/01/2015    TDAP status: Due, Education has  been provided regarding the importance of this vaccine. Advised may receive this vaccine at local pharmacy or Health Dept. Aware to provide a copy of the vaccination record if obtained from local pharmacy or Health Dept. Verbalized acceptance and understanding.  Flu Vaccine status: Up to date  Pneumococcal vaccine status: Up to date  Covid-19 vaccine status: Completed vaccines  Qualifies for Shingles Vaccine? Yes   Zostavax completed No   Shingrix Completed?: No.    Education has been provided regarding the importance of this vaccine. Patient has been advised to call insurance company to determine out of pocket expense if they have not yet received this vaccine. Advised may also receive vaccine at local pharmacy or Health Dept. Verbalized acceptance and understanding.  Screening Tests Health Maintenance  Topic Date Due   Zoster Vaccines- Shingrix (1 of 2) Never done   COVID-19 Vaccine (3 - Booster for Pfizer series) 03/01/2021 (Originally 07/01/2019)   INFLUENZA VACCINE  06/30/2021 (Originally 10/31/2020)   COLONOSCOPY (Pts 45-67yr Insurance coverage will need to be confirmed)  08/15/2021 (Originally 10/26/2019)   TETANUS/TDAP  09/08/2028   Pneumonia Vaccine 75 Years old  Completed   DEXA SCAN  Completed   Hepatitis C Screening  Completed   HPV VACCINES  Aged Out    Health Maintenance  Health Maintenance Due  Topic Date Due   Zoster Vaccines- Shingrix (1 of 2) Never done    Colorectal cancer screening: No longer required.   Mammogram status: No longer required due to age.  Bone Density status: Completed 07/17/2017. Results reflect: Bone density results: OSTEOPENIA. Repeat every 5 years.  Lung Cancer Screening: (Low Dose CT Chest recommended if Age 75-80years, 30 pack-year currently smoking OR have quit w/in 15years.) does not qualify.   Lung Cancer Screening Referral: n/a  Additional Screening:  Hepatitis C  Screening: does not qualify; Completed 10/05/2016  Vision Screening: Recommended annual ophthalmology exams for early detection of glaucoma and other disorders of the eye. Is the patient up to date with their annual eye exam?  Yes  Who is the provider or what is the name of the office in which the patient attends annual eye exams? Dr.McQuen  If pt is not established with a provider, would they like to be referred to a provider to establish care? No .   Dental Screening: Recommended annual dental exams for proper oral hygiene  Community Resource Referral / Chronic Care Management: CRR required this visit?  No   CCM required this visit?  No      Plan:     I have personally reviewed and noted the following in the patient's chart:   Medical and social history Use of alcohol, tobacco or illicit drugs  Current medications and supplements including opioid prescriptions.  Functional ability and status Nutritional status Physical activity Advanced directives List of other physicians Hospitalizations, surgeries, and ER visits in previous 12 months Vitals Screenings to include cognitive, depression, and falls Referrals and appointments  In addition, I have reviewed and discussed with patient certain preventive protocols, quality metrics, and best practice recommendations. A written personalized care plan for preventive services as well as general preventive health recommendations were provided to patient.     LRandel Pigg LPN   [id number]  Nurse Notes: none

## 2021-02-16 NOTE — Patient Instructions (Signed)
Erin Mckee , Thank you for taking time to come for your Medicare Wellness Visit. I appreciate your ongoing commitment to your health goals. Please review the following plan we discussed and let me know if I can assist you in the future.   Screening recommendations/referrals: Colonoscopy: no longer required  Mammogram: no longer required  Bone Density: 07/17/2017 Recommended yearly ophthalmology/optometry visit for glaucoma screening and checkup Recommended yearly dental visit for hygiene and checkup  Vaccinations: Influenza vaccine: completed  Pneumococcal vaccine: completed  Tdap vaccine: due  Shingles vaccine: will consider     Advanced directives: none   Conditions/risks identified: none   Next appointment: none    Preventive Care 14 Years and Older, Female Preventive care refers to lifestyle choices and visits with your health care provider that can promote health and wellness. What does preventive care include? A yearly physical exam. This is also called an annual well check. Dental exams once or twice a year. Routine eye exams. Ask your health care provider how often you should have your eyes checked. Personal lifestyle choices, including: Daily care of your teeth and gums. Regular physical activity. Eating a healthy diet. Avoiding tobacco and drug use. Limiting alcohol use. Practicing safe sex. Taking low-dose aspirin every day. Taking vitamin and mineral supplements as recommended by your health care provider. What happens during an annual well check? The services and screenings done by your health care provider during your annual well check will depend on your age, overall health, lifestyle risk factors, and family history of disease. Counseling  Your health care provider may ask you questions about your: Alcohol use. Tobacco use. Drug use. Emotional well-being. Home and relationship well-being. Sexual activity. Eating habits. History of falls. Memory and  ability to understand (cognition). Work and work Statistician. Reproductive health. Screening  You may have the following tests or measurements: Height, weight, and BMI. Blood pressure. Lipid and cholesterol levels. These may be checked every 5 years, or more frequently if you are over 42 years old. Skin check. Lung cancer screening. You may have this screening every year starting at age 34 if you have a 30-pack-year history of smoking and currently smoke or have quit within the past 15 years. Fecal occult blood test (FOBT) of the stool. You may have this test every year starting at age 70. Flexible sigmoidoscopy or colonoscopy. You may have a sigmoidoscopy every 5 years or a colonoscopy every 10 years starting at age 52. Hepatitis C blood test. Hepatitis B blood test. Sexually transmitted disease (STD) testing. Diabetes screening. This is done by checking your blood sugar (glucose) after you have not eaten for a while (fasting). You may have this done every 1-3 years. Bone density scan. This is done to screen for osteoporosis. You may have this done starting at age 76. Mammogram. This may be done every 1-2 years. Talk to your health care provider about how often you should have regular mammograms. Talk with your health care provider about your test results, treatment options, and if necessary, the need for more tests. Vaccines  Your health care provider may recommend certain vaccines, such as: Influenza vaccine. This is recommended every year. Tetanus, diphtheria, and acellular pertussis (Tdap, Td) vaccine. You may need a Td booster every 10 years. Zoster vaccine. You may need this after age 49. Pneumococcal 13-valent conjugate (PCV13) vaccine. One dose is recommended after age 74. Pneumococcal polysaccharide (PPSV23) vaccine. One dose is recommended after age 23. Talk to your health care provider about which screenings  and vaccines you need and how often you need them. This information is  not intended to replace advice given to you by your health care provider. Make sure you discuss any questions you have with your health care provider. Document Released: 04/15/2015 Document Revised: 12/07/2015 Document Reviewed: 01/18/2015 Elsevier Interactive Patient Education  2017 [place] Prevention in the Home Falls can cause injuries. They can happen to people of all ages. There are many things you can do to make your home safe and to help prevent falls. What can I do on the outside of my home? Regularly fix the edges of walkways and driveways and fix any cracks. Remove anything that might make you trip as you walk through a door, such as a raised step or threshold. Trim any bushes or trees on the path to your home. Use bright outdoor lighting. Clear any walking paths of anything that might make someone trip, such as rocks or tools. Regularly check to see if handrails are loose or broken. Make sure that both sides of any steps have handrails. Any raised decks and porches should have guardrails on the edges. Have any leaves, snow, or ice cleared regularly. Use sand or salt on walking paths during winter. Clean up any spills in your garage right away. This includes oil or grease spills. What can I do in the bathroom? Use night lights. Install grab bars by the toilet and in the tub and shower. Do not use towel bars as grab bars. Use non-skid mats or decals in the tub or shower. If you need to sit down in the shower, use a plastic, non-slip stool. Keep the floor dry. Clean up any water that spills on the floor as soon as it happens. Remove soap buildup in the tub or shower regularly. Attach bath mats securely with double-sided non-slip rug tape. Do not have throw rugs and other things on the floor that can make you trip. What can I do in the bedroom? Use night lights. Make sure that you have a light by your bed that is easy to reach. Do not use any sheets or blankets that  are too big for your bed. They should not hang down onto the floor. Have a firm chair that has side arms. You can use this for support while you get dressed. Do not have throw rugs and other things on the floor that can make you trip. What can I do in the kitchen? Clean up any spills right away. Avoid walking on wet floors. Keep items that you use a lot in easy-to-reach places. If you need to reach something above you, use a strong step stool that has a grab bar. Keep electrical cords out of the way. Do not use floor polish or wax that makes floors slippery. If you must use wax, use non-skid floor wax. Do not have throw rugs and other things on the floor that can make you trip. What can I do with my stairs? Do not leave any items on the stairs. Make sure that there are handrails on both sides of the stairs and use them. Fix handrails that are broken or loose. Make sure that handrails are as long as the stairways. Check any carpeting to make sure that it is firmly attached to the stairs. Fix any carpet that is loose or worn. Avoid having throw rugs at the top or bottom of the stairs. If you do have throw rugs, attach them to the floor with carpet tape.  Make sure that you have a light switch at the top of the stairs and the bottom of the stairs. If you do not have them, ask someone to add them for you. What else can I do to help prevent falls? Wear shoes that: Do not have high heels. Have rubber bottoms. Are comfortable and fit you well. Are closed at the toe. Do not wear sandals. If you use a stepladder: Make sure that it is fully opened. Do not climb a closed stepladder. Make sure that both sides of the stepladder are locked into place. Ask someone to hold it for you, if possible. Clearly mark and make sure that you can see: Any grab bars or handrails. First and last steps. Where the edge of each step is. Use tools that help you move around (mobility aids) if they are needed. These  include: Canes. Walkers. Scooters. Crutches. Turn on the lights when you go into a dark area. Replace any light bulbs as soon as they burn out. Set up your furniture so you have a clear path. Avoid moving your furniture around. If any of your floors are uneven, fix them. If there are any pets around you, be aware of where they are. Review your medicines with your doctor. Some medicines can make you feel dizzy. This can increase your chance of falling. Ask your doctor what other things that you can do to help prevent falls. This information is not intended to replace advice given to you by your health care provider. Make sure you discuss any questions you have with your health care provider. Document Released: 01/13/2009 Document Revised: 08/25/2015 Document Reviewed: 04/23/2014 Elsevier Interactive Patient Education  2017 [employer].

## 2021-02-28 DIAGNOSIS — Z961 Presence of intraocular lens: Secondary | ICD-10-CM | POA: Diagnosis not present

## 2021-05-15 IMAGING — DX DG ANKLE COMPLETE 3+V*L*
3 series · 3 of 3 positions shown · non-contrast
Comparison: None.

CLINICAL DATA: Pain status post fall

EXAM:
LEFT ANKLE COMPLETE - 3+ VIEW

[ankle ap]
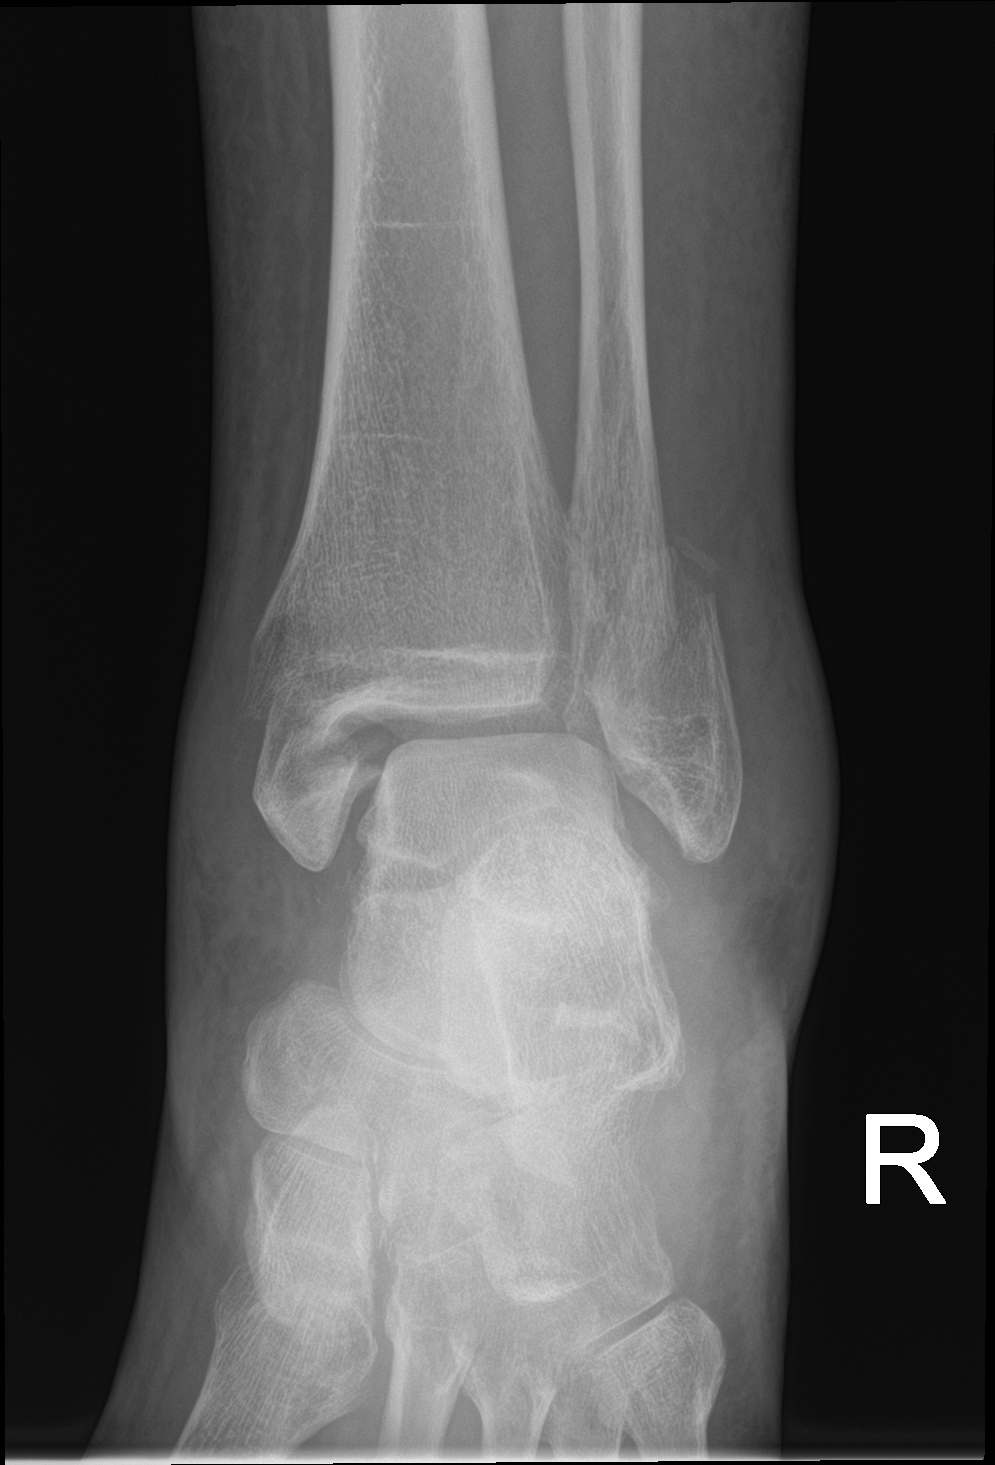

[ankle obl]
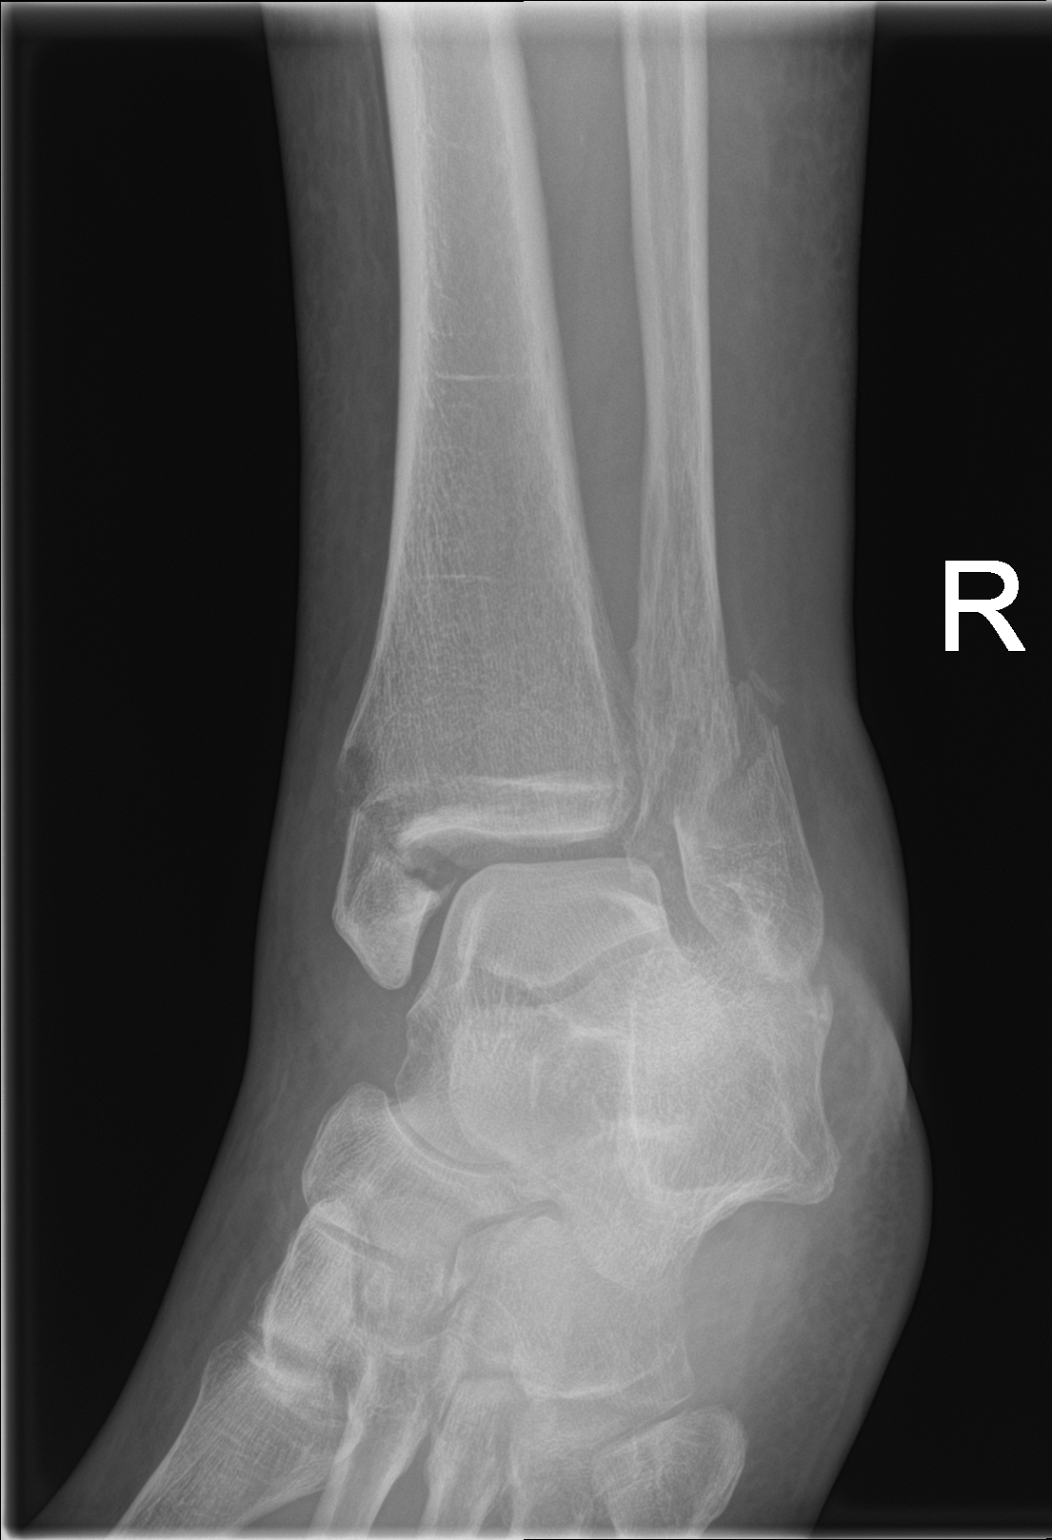

[ankle lat]
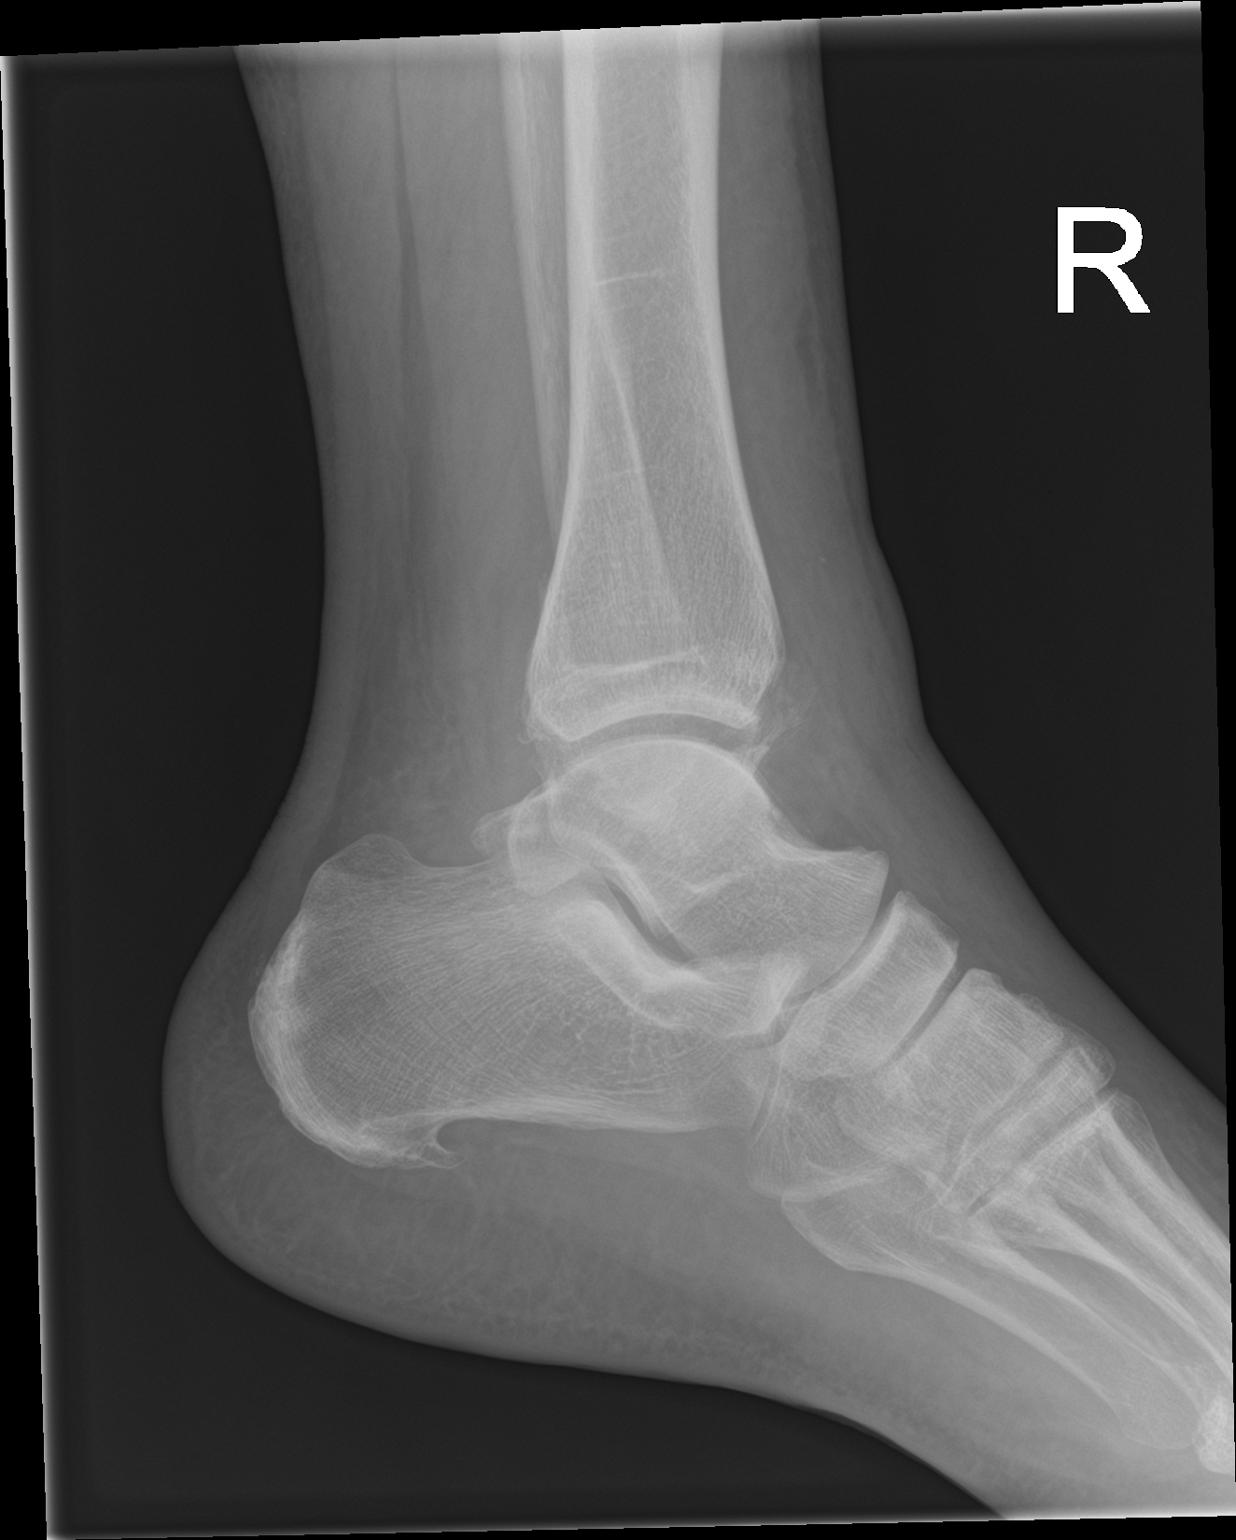

[3 of 3 positions shown; findings below may reference images not displayed]

FINDINGS: There are acute displaced fractures of the medial and lateral
malleoli. There is widening of the medial clear space. There is no
convincing fracture of the posterior malleolus. There is extensive
surrounding soft tissue swelling. There is a small plantar calcaneal
spur.
IMPRESSION: 1. Acute displaced fractures of the medial and lateral malleoli.
2. Widening of the medial clear space.
3. Soft tissue swelling about the ankle.

## 2021-05-15 IMAGING — DX DG FOOT COMPLETE 3+V*L*
3 series · 3 of 3 positions shown · non-contrast
Comparison: None.

CLINICAL DATA: Pain status post fall

EXAM:
LEFT FOOT - COMPLETE 3+ VIEW

[foot ap]
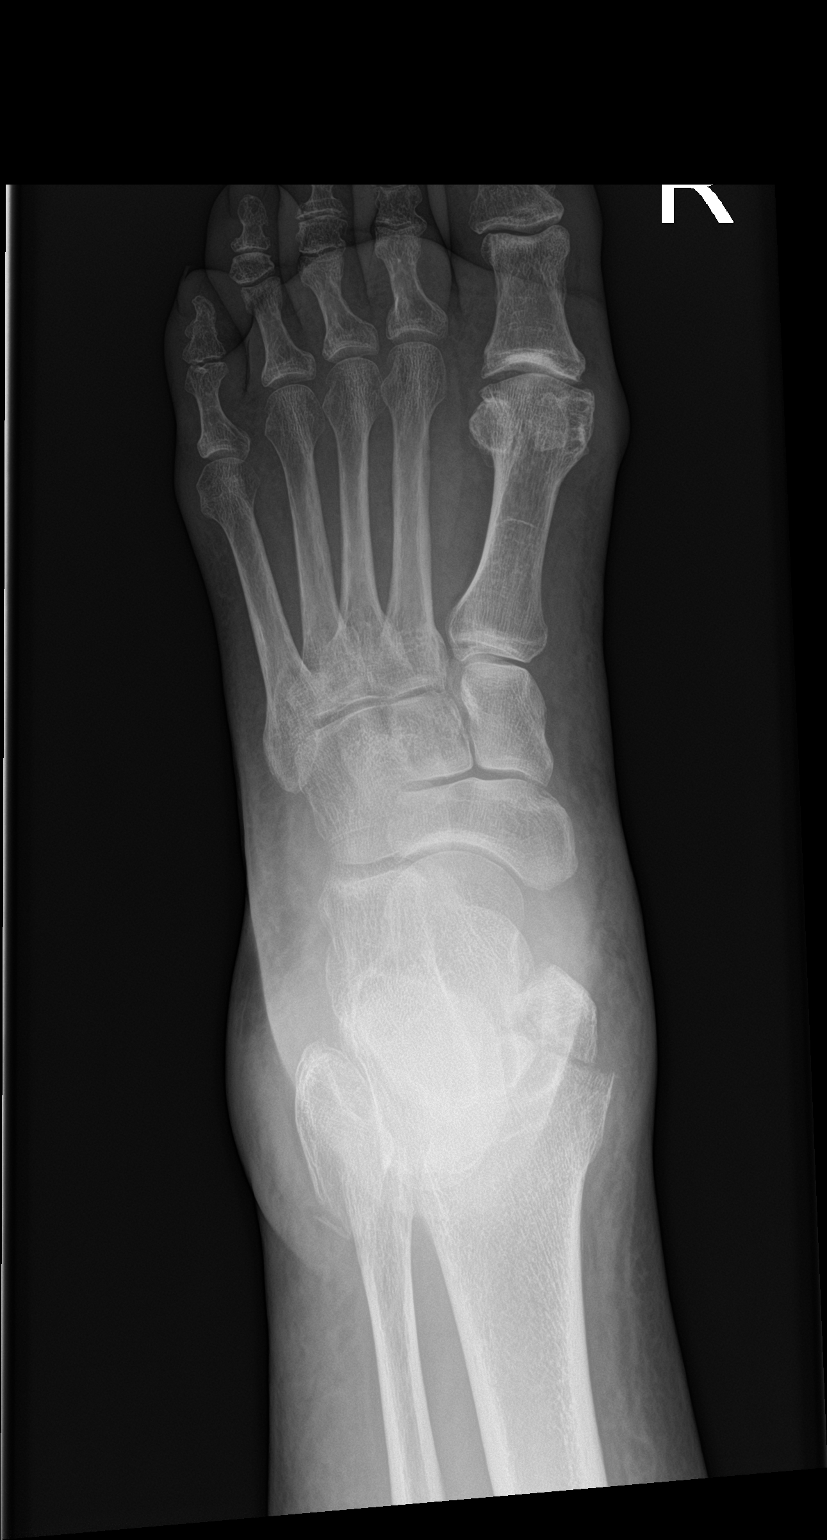

[foot obl]
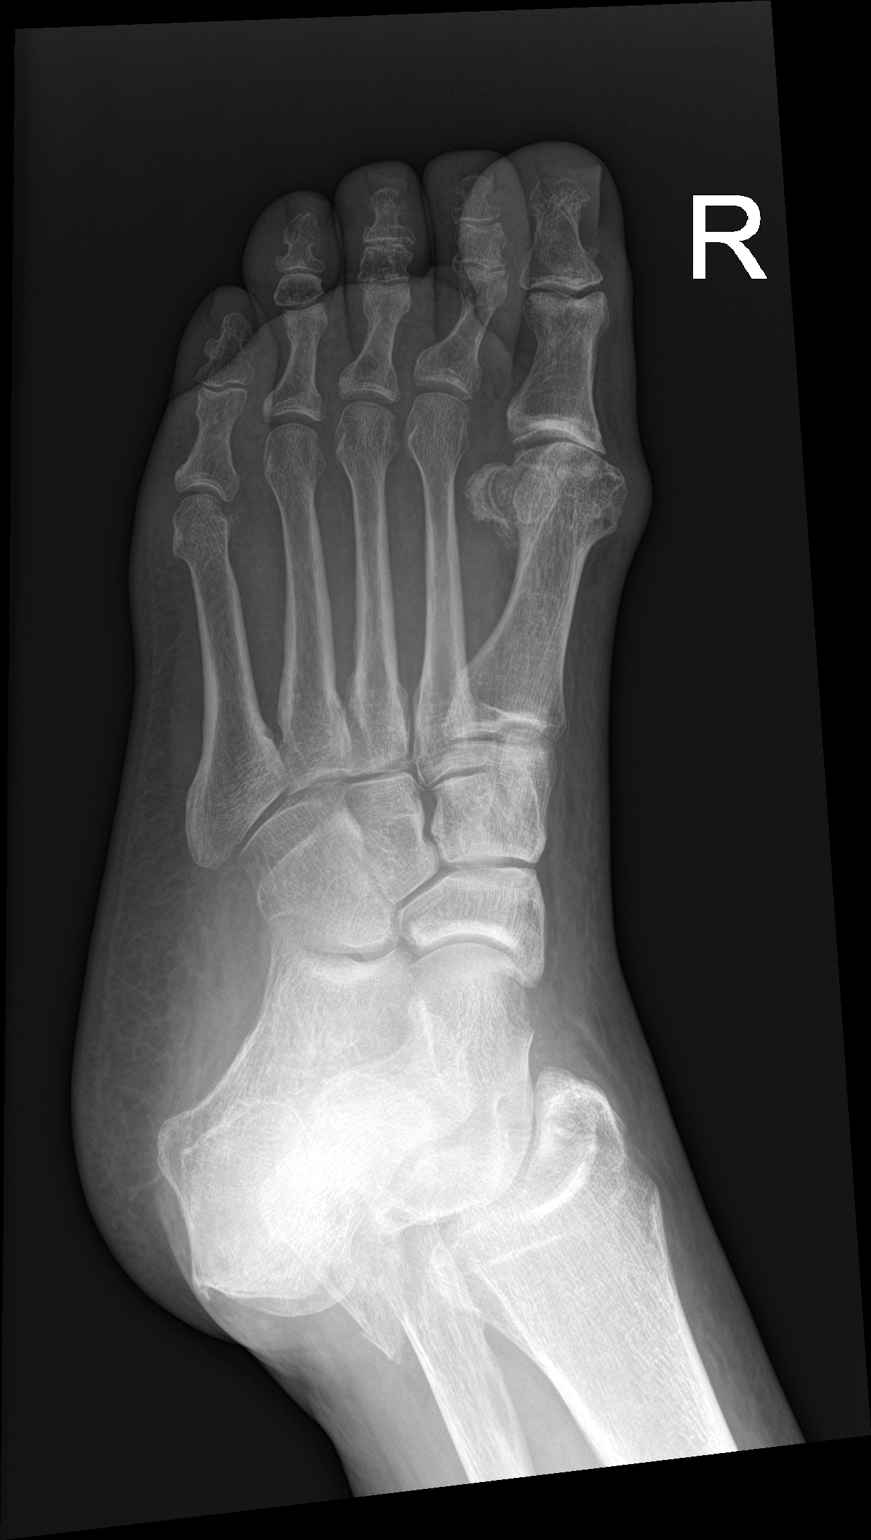

[foot lat]
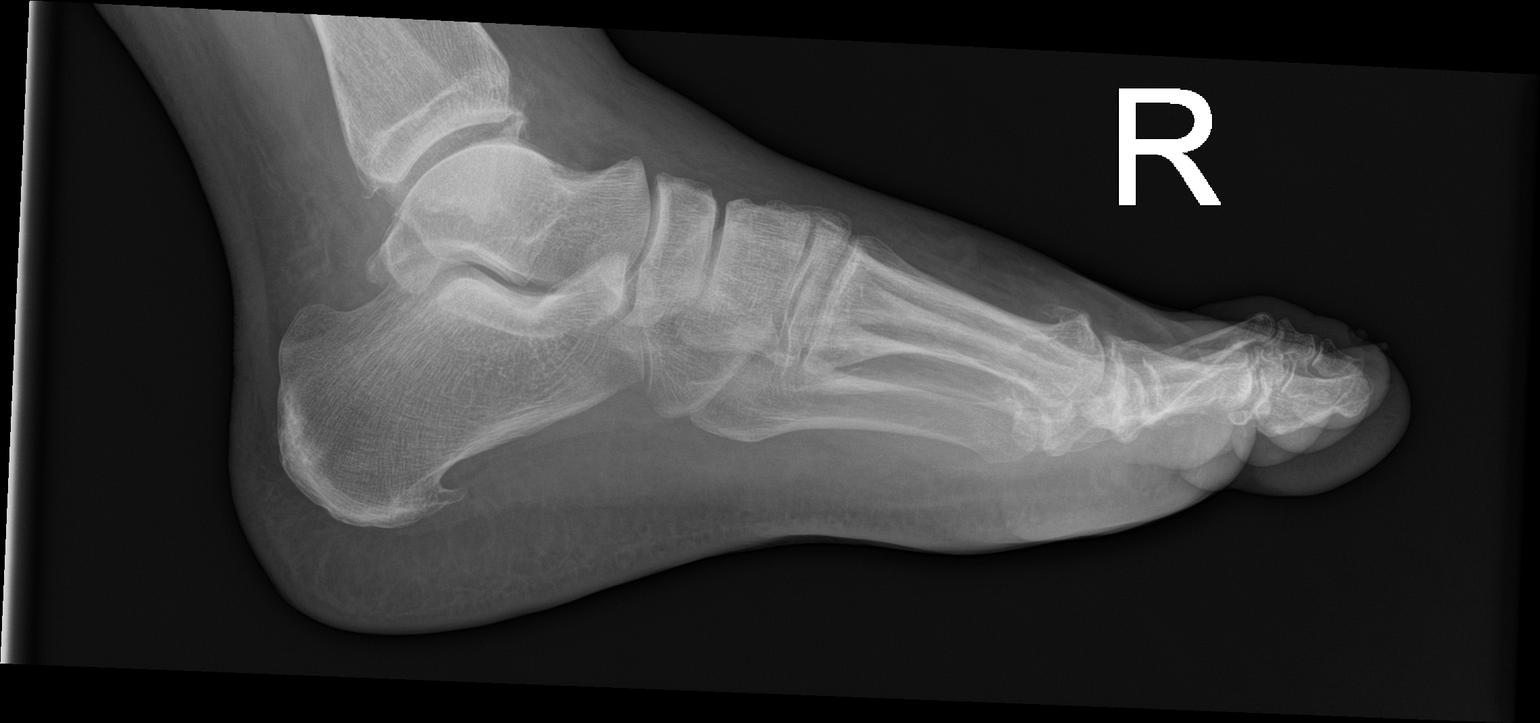

[3 of 3 positions shown; findings below may reference images not displayed]

FINDINGS: There are moderate degenerative changes at the first
metatarsophalangeal joint. There are acute fractures of the distal
fibula and medial malleolus. There is no evidence for an acute
displaced fracture or dislocation involving the foot. There is a
small plantar calcaneal spur.
IMPRESSION: 1. There are acute displaced fractures of the distal fibula and
medial malleolus. See separate ankle radiographs for further
details.
2. No acute displaced fracture involving the foot.
3. Moderate degenerative changes are noted at the first
metatarsophalangeal joint.

## 2021-07-18 DIAGNOSIS — Z20822 Contact with and (suspected) exposure to covid-19: Secondary | ICD-10-CM | POA: Diagnosis not present

## 2021-07-18 DIAGNOSIS — R051 Acute cough: Secondary | ICD-10-CM | POA: Diagnosis not present

## 2021-07-18 DIAGNOSIS — R059 Cough, unspecified: Secondary | ICD-10-CM | POA: Diagnosis not present

## 2021-08-23 ENCOUNTER — Encounter: Payer: Self-pay | Admitting: Family Medicine

## 2021-08-23 ENCOUNTER — Other Ambulatory Visit: Payer: Self-pay | Admitting: Family Medicine

## 2021-08-23 ENCOUNTER — Ambulatory Visit (INDEPENDENT_AMBULATORY_CARE_PROVIDER_SITE_OTHER): Payer: Medicare Other | Admitting: Family Medicine

## 2021-08-23 VITALS — BP 118/66 | HR 68 | Temp 98.0°F | Resp 15 | Ht 64.0 in | Wt 176.6 lb

## 2021-08-23 DIAGNOSIS — Z0000 Encounter for general adult medical examination without abnormal findings: Secondary | ICD-10-CM

## 2021-08-23 DIAGNOSIS — E785 Hyperlipidemia, unspecified: Secondary | ICD-10-CM

## 2021-08-23 DIAGNOSIS — I10 Essential (primary) hypertension: Secondary | ICD-10-CM | POA: Diagnosis not present

## 2021-08-23 DIAGNOSIS — R739 Hyperglycemia, unspecified: Secondary | ICD-10-CM

## 2021-08-23 DIAGNOSIS — J383 Other diseases of vocal cords: Secondary | ICD-10-CM | POA: Diagnosis not present

## 2021-08-23 DIAGNOSIS — Z131 Encounter for screening for diabetes mellitus: Secondary | ICD-10-CM

## 2021-08-23 DIAGNOSIS — R490 Dysphonia: Secondary | ICD-10-CM

## 2021-08-23 DIAGNOSIS — E876 Hypokalemia: Secondary | ICD-10-CM

## 2021-08-23 LAB — LIPID PANEL
Cholesterol: 175 mg/dL (ref 0–200)
HDL: 58 mg/dL (ref >39.00)
LDL Cholesterol: 90 mg/dL (ref 0–99)
NonHDL: 116.98
Total CHOL/HDL Ratio: 3
Triglycerides: 136 mg/dL (ref 0.0–149.0)
VLDL: 27.2 mg/dL (ref 0.0–40.0)

## 2021-08-23 LAB — COMPREHENSIVE METABOLIC PANEL
ALT: 26 U/L (ref 0–35)
AST: 22 U/L (ref 0–37)
Albumin: 4.7 g/dL (ref 3.5–5.2)
Alkaline Phosphatase: 84 U/L (ref 39–117)
BUN: 15 mg/dL (ref 6–23)
CO2: 29 mEq/L (ref 19–32)
Calcium: 10.1 mg/dL (ref 8.4–10.5)
Chloride: 99 mEq/L (ref 96–112)
Creatinine, Ser: 0.81 mg/dL (ref 0.40–1.20)
GFR: 70.73 mL/min (ref >60.00)
Glucose, Bld: 103 mg/dL — ABNORMAL HIGH (ref 70–99)
Potassium: 3.2 mEq/L — ABNORMAL LOW (ref 3.5–5.1)
Sodium: 138 mEq/L (ref 135–145)
Total Bilirubin: 0.8 mg/dL (ref 0.2–1.2)
Total Protein: 6.9 g/dL (ref 6.0–8.3)

## 2021-08-23 LAB — HEMOGLOBIN A1C: Hgb A1c MFr Bld: 5.3 % (ref 4.6–6.5)

## 2021-08-23 MED ORDER — ATORVASTATIN CALCIUM 10 MG PO TABS: 10.0000 mg | ORAL_TABLET | Freq: Every day | ORAL | 1 refills | Status: DC

## 2021-08-23 MED ORDER — LORAZEPAM 1 MG PO TABS: 0.5000 mg | ORAL_TABLET | Freq: Two times a day (BID) | ORAL | 1 refills | Status: DC | PRN

## 2021-08-23 NOTE — Patient Instructions (Addendum)
Call gastroenterology as you appear to be due this year for colonoscopy.  No med changes today.  I do recommend repeat mammogram - if normal, then may decide not to repeat after this year.  I recommend shingrix vaccine through your pharmacy.   We recommend that you schedule a mammogram for breast cancer screening. Typically, you do not need a referral to do this. Please contact a local imaging center to schedule your mammogram. [facility] ([place]) - [phone] or [phone]   Thanks for coming in today.   Health Maintenance After Age 52 After age 21, you are at a higher risk for certain long-term diseases and infections as well as injuries from falls. Falls are a major cause of broken bones and head injuries in people who are older than age 5. Getting regular preventive care can help to keep you healthy and well. Preventive care includes getting regular testing and making lifestyle changes as recommended by your health care provider. Talk with your health care provider about: Which screenings and tests you should have. A screening is a test that checks for a disease when you have no symptoms. A diet and exercise plan that is right for you. What should I know about screenings and tests to prevent falls? Screening and testing are the best ways to find a health problem early. Early diagnosis and treatment give you the best chance of managing medical conditions that are common after age 61. Certain conditions and lifestyle choices may make you more likely to have a fall. Your health care provider may recommend: Regular vision checks. Poor vision and conditions such as cataracts can make you more likely to have a fall. If you wear glasses, make sure to get your prescription updated if your vision changes. Medicine review. Work with your health care provider to regularly review all of the medicines you are taking, including over-the-counter medicines. Ask your health care  provider about any side effects that may make you more likely to have a fall. Tell your health care provider if any medicines that you take make you feel dizzy or sleepy. Strength and balance checks. Your health care provider may recommend certain tests to check your strength and balance while standing, walking, or changing positions. Foot health exam. Foot pain and numbness, as well as not wearing proper footwear, can make you more likely to have a fall. Screenings, including: Osteoporosis screening. Osteoporosis is a condition that causes the bones to get weaker and break more easily. Blood pressure screening. Blood pressure changes and medicines to control blood pressure can make you feel dizzy. Depression screening. You may be more likely to have a fall if you have a fear of falling, feel depressed, or feel unable to do activities that you used to do. Alcohol use screening. Using too much alcohol can affect your balance and may make you more likely to have a fall. Follow these instructions at home: Lifestyle Do not drink alcohol if: Your health care provider tells you not to drink. If you drink alcohol: Limit how much you have to: 0-1 drink a day for women. 0-2 drinks a day for men. Know how much alcohol is in your drink. In the [place] one drink equals one 12 oz bottle of beer (355 mL), one 5 oz glass of wine (148 mL), or one 1 oz glass of hard liquor (44 mL). Do not use any products that contain nicotine or tobacco. These products include cigarettes, chewing tobacco, and vaping devices,  such as e-cigarettes. If you need help quitting, ask your health care provider. Activity  Follow a regular exercise program to stay fit. This will help you maintain your balance. Ask your health care provider what types of exercise are appropriate for you. If you need a cane or walker, use it as recommended by your health care provider. Wear supportive shoes that have nonskid soles. Safety  Remove any  tripping hazards, such as rugs, cords, and clutter. Install safety equipment such as grab bars in bathrooms and safety rails on stairs. Keep rooms and walkways well-lit. General instructions Talk with your health care provider about your risks for falling. Tell your health care provider if: You fall. Be sure to tell your health care provider about all falls, even ones that seem minor. You feel dizzy, tiredness (fatigue), or off-balance. Take over-the-counter and prescription medicines only as told by your health care provider. These include supplements. Eat a healthy diet and maintain a healthy weight. A healthy diet includes low-fat dairy products, low-fat (lean) meats, and fiber from whole grains, beans, and lots of fruits and vegetables. Stay current with your vaccines. Schedule regular health, dental, and eye exams. Summary Having a healthy lifestyle and getting preventive care can help to protect your health and wellness after age 28. Screening and testing are the best way to find a health problem early and help you avoid having a fall. Early diagnosis and treatment give you the best chance for managing medical conditions that are more common for people who are older than age 91. Falls are a major cause of broken bones and head injuries in people who are older than age 35. Take precautions to prevent a fall at home. Work with your health care provider to learn what changes you can make to improve your health and wellness and to prevent falls. This information is not intended to replace advice given to you by your health care provider. Make sure you discuss any questions you have with your health care provider. Document Revised: 08/08/2020 Document Reviewed: 08/08/2020 Elsevier Patient Education  [place].

## 2021-08-23 NOTE — Progress Notes (Signed)
Subjective:  Patient ID: Erin Mckee, female    DOB: 11/17/1945  Age: 75 y.o. MRN: [id number]  CC:  Chief Complaint  Patient presents with   Annual Exam    Pt here for annual exam, notes no concern, pt is fasting for lab work    HPI Erin Mckee presents for Annual Exam Annual wellness exam in November. Care team PCP, me Optho, Dr. Ellie Lunch  Dysphonia with vocal cord dysfunction Significant work-up, treatment previously.  Ultimately has been controlled with intermittent use of Ativan, one half of 1 mg during flares.  Usually uses it at work.  Last prescription 02/13/2021, no concerns on controlled substance database review.  Still working some for pharmacy. Rare use outside of work if not able to talk - 1/2 pill helps.    Hypertension: HCTZ 25 mg daily, Toprol-XL 50 mg daily. No new side effects.  Frequent horseback riding, camping, yoga, planned on weight loss at her November visit in order to ride in [place] in June. Will be riding at upcoming trip in June.  Home readings: none recent.  Glucose 101 in 01/2021.  BP Readings from Last 3 Encounters:  08/23/21 118/66  02/13/21 136/68  08/15/20 134/72   Lab Results  Component Value Date   CREATININE 0.85 02/13/2021   Wt Readings from Last 3 Encounters:  08/23/21 176 lb 9.6 oz (80.1 kg)  02/13/21 179 lb 12.8 oz (81.6 kg)  08/15/20 175 lb 12.8 oz (79.7 kg)    Hyperlipidemia: Lipitor 10 mg daily.  Had  recently started at her November visit. Daily use, no myalgias.  Lab Results  Component Value Date   CHOL 202 (H) 02/13/2021   HDL 62.80 02/13/2021   LDLCALC 105 (H) 02/13/2021   TRIG 168.0 (H) 02/13/2021   CHOLHDL 3 02/13/2021   Lab Results  Component Value Date   ALT 27 02/13/2021   AST 20 02/13/2021   ALKPHOS 88 02/13/2021   BILITOT 0.6 02/13/2021         08/23/2021    8:07 AM 02/16/2021    3:09 PM 02/16/2021    3:07 PM 02/13/2021    9:08 AM 08/15/2020    8:21 AM  Depression screen PHQ 2/9   Decreased Interest 0 0 0 0 0  Down, Depressed, Hopeless 0 0 0 0 0  PHQ - 2 Score 0 0 0 0 0  Altered sleeping 0      Tired, decreased energy 1      Change in appetite 0      Feeling bad or failure about yourself  0      Trouble concentrating 0      Moving slowly or fidgety/restless 0      Suicidal thoughts 0      PHQ-9 Score 1      No new fatigue. Tired if staying busy.   Health Maintenance  Topic Date Due   COVID-19 Vaccine (3 - Booster for Pfizer series) 09/08/2021 (Originally 07/01/2019)   Zoster Vaccines- Shingrix (1 of 2) 11/23/2021 (Originally 09/02/1995)   COLONOSCOPY (Pts 45-69yr Insurance coverage will need to be confirmed)  08/24/2022 (Originally 10/26/2019)   INFLUENZA VACCINE  10/31/2021   TETANUS/TDAP  09/08/2028   Pneumonia Vaccine 76 Years old  Completed   DEXA SCAN  Completed   Hepatitis C Screening  Completed   HPV VACCINES  Aged Out  Colonoscopy 2016, tubular adenoma, hyperplastic polyp. Repeat 5 yrs, then changed to 7 years. Dr. KDeatra Ina  Mammogram - not recent -  recommended, then optional at age to repeat.    Immunization History  Administered Date(s) Administered   Hepatitis B 04/14/1997   Influenza Split 01/17/2012, 12/15/2013   PFIZER(Purple Top)SARS-COV-2 Vaccination 04/15/2019, 05/06/2019   Pneumococcal Conjugate-13 02/22/2014   Pneumococcal Polysaccharide-23 04/14/2012   Td 09/09/2018   Tdap 04/02/2008   Zoster, Live 02/01/2015  Shingrix - not had, recommended.  Covid vaccine - 1 booster in 2021. Recommended bivalent booster. Declined.   No results found. Dr. Ellie Lunch -  2 cataract surgeries, recent visit.   Dental:Yes and Within Last 6 months  Alcohol: 1 drink per day.   Tobacco: none.   Exercise: yoga,  Limited after pulled mm in back, but better now and restarted yoga. Horseback riding and walking.   History Patient Active Problem List   Diagnosis Date Noted   Distal radius fracture, right 02/14/2012   Hyperlipidemia 09/13/2011    HYPERTENSION 01/15/2008   VOCAL CORD DISORDER 01/15/2008   DYSPNEA 01/15/2008   PALPITATIONS, HX OF 01/15/2008   Past Medical History:  Diagnosis Date   GERD (gastroesophageal reflux disease)    spasmodic dysphonia   Heart murmur    resolved- related to stress of throat closing , echo  done 2009   Hepatitis    hep- a as a child   Hyperlipidemia    Hypertension    PCP manages htn, pt. also reports that she has seen Cardiology  at [facility] too.    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   Past Surgical History:  Procedure Laterality Date   CATARACT EXTRACTION Bilateral    june 2019 and january 2022   COLONOSCOPY     FRACTURE SURGERY     OPEN REDUCTION INTERNAL FIXATION (ORIF) DISTAL RADIAL FRACTURE  02/14/2012   Procedure: OPEN REDUCTION INTERNAL FIXATION (ORIF) DISTAL RADIAL FRACTURE;  Surgeon: Mcarthur Rossetti, MD;  Location: [facility];  Service: Orthopedics;  Laterality: Right;  Open Reduction Internal Fixation Right Wrist   ORIF ANKLE FRACTURE Left 06/30/2019   Procedure: OPEN REDUCTION INTERNAL FIXATION (ORIF) LEFT ANKLE FRACTURE;  Surgeon: Meredith Pel, MD;  Location: [facility];  Service: Orthopedics;  Laterality: Left;   TONSILLECTOMY     2009   No Known Allergies Prior to Admission medications   Medication Sig Start Date End Date Taking? Authorizing Provider  atorvastatin (LIPITOR) 10 MG tablet Take 1 tablet (10 mg total) by mouth daily. 02/13/21  Yes [name], MD  hydrochlorothiazide (HYDRODIURIL) 25 MG tablet Take 1 tablet (25 mg total) by mouth daily. 02/13/21  Yes [name], MD  LORazepam (ATIVAN) 1 MG tablet Take 0.5-1 tablets (0.5-1 mg total) by mouth 2 (two) times daily as needed. For throat spasms 02/13/21  Yes [name], MD  metoprolol succinate (TOPROL-XL) 50 MG 24 hr tablet Take 1 tablet (50 mg total) by mouth daily. 02/13/21  Yes [name], MD   Social  History   Socioeconomic History   Marital status: Married    Spouse name: Not on file   Number of children: 2   Years of education: Not on file   Highest education level: Professional school degree (e.g., MD, DDS, DVM, JD)  Occupational History   Occupation: pharmacist    Comment: retired 2016; now PRN    Employer: [employer]  Tobacco Use   Smoking status: Never   Smokeless tobacco: Never  Vaping Use   Vaping Use:  Never used  Substance and Sexual Activity   Alcohol use: Yes    Alcohol/week: 6.0 standard drinks    Types: 6 Glasses of wine per week    Comment: Occasional   Drug use: No   Sexual activity: Yes    Birth control/protection: Post-menopausal  Other Topics Concern   Not on file  Social History Narrative   Industrial/product designer   Married   Exercises   Social Determinants of Health   Financial Resource Strain: Low Risk    Difficulty of Paying Living Expenses: Not hard at all  Food Insecurity: No Food Insecurity   Worried About Charity fundraiser in the Last Year: Never true   Ran Out of Food in the Last Year: Never true  Transportation Needs: No Transportation Needs   Lack of Transportation (Medical): No   Lack of Transportation (Non-Medical): No  Physical Activity: Sufficiently Active   Days of Exercise per Week: 5 days   Minutes of Exercise per Session: 60 min  Stress: No Stress Concern Present   Feeling of Stress : Not at all  Social Connections: Moderately Integrated   Frequency of Communication with Friends and Family: Twice a week   Frequency of Social Gatherings with Friends and Family: Twice a week   Attends Religious Services: Never   Marine scientist or Organizations: Yes   Attends Music therapist: More than 4 times per year   Marital Status: Married  Human resources officer Violence: Not At Risk   Fear of Current or Ex-Partner: No   Emotionally Abused: No   Physically Abused: No   Sexually Abused: No    Review of Systems   Constitutional:  Negative for fatigue and unexpected weight change.  Respiratory:  Negative for chest tightness and shortness of breath.   Cardiovascular:  Negative for chest pain, palpitations and leg swelling.  Gastrointestinal:  Negative for abdominal pain and blood in stool.  Neurological:  Negative for dizziness, syncope, light-headedness and headaches.  13 point review of systems per patient health survey noted.  Negative other than as indicated above or in HPI.    Objective:   Vitals:   08/23/21 0812  BP: 118/66  Pulse: 68  Resp: 15  Temp: 98 F (36.7 C)  TempSrc: Temporal  SpO2: 95%  Weight: 176 lb 9.6 oz (80.1 kg)  Height: '5\' 4"'$  (1.626 m)     Physical Exam Vitals reviewed.  Constitutional:      Appearance: She is well-developed.  HENT:     Head: Normocephalic and atraumatic.     Right Ear: External ear normal.     Left Ear: External ear normal.  Eyes:     Conjunctiva/sclera: Conjunctivae normal.     Pupils: Pupils are equal, round, and reactive to light.  Neck:     Thyroid: No thyromegaly.  Cardiovascular:     Rate and Rhythm: Normal rate and regular rhythm.     Heart sounds: Normal heart sounds. No murmur heard. Pulmonary:     Effort: Pulmonary effort is normal. No respiratory distress.     Breath sounds: Normal breath sounds. No wheezing.  Abdominal:     General: Bowel sounds are normal.     Palpations: Abdomen is soft.     Tenderness: There is no abdominal tenderness.  Musculoskeletal:        General: No tenderness. Normal range of motion.     Cervical back: Normal range of motion and neck supple.  Lymphadenopathy:  Cervical: No cervical adenopathy.  Skin:    General: Skin is warm and dry.     Findings: No rash.  Neurological:     Mental Status: She is alert and oriented to person, place, and time.  Psychiatric:        Behavior: Behavior normal.        Thought Content: Thought content normal.       Assessment & Plan:  LOTOYA CASELLA  is a 76 y.o. female . Annual physical exam - Plan: Comprehensive metabolic panel, Lipid panel  - -anticipatory guidance as below in AVS, screening labs above. Health maintenance items as above in HPI discussed/recommended as applicable.   Essential hypertension - Plan: Comprehensive metabolic panel  -  Stable, tolerating current regimen. Medications have refills.  Labs pending as above. No changes.   Hyperlipidemia, unspecified hyperlipidemia type - Plan: Lipid panel, atorvastatin (LIPITOR) 10 MG tablet  -  Stable, tolerating current regimen. Medications refilled. Labs pending as above.   Screening for diabetes mellitus Hyperglycemia - Plan: Hemoglobin A1c  -Borderline glucose in November, check A1c, CMP.  Dysphonia - Plan: LORazepam (ATIVAN) 1 MG tablet Vocal cord dysfunction - Plan: LORazepam (ATIVAN) 1 MG tablet  -Stable with rare use of lorazepam.  Refilled.  Meds ordered this encounter  Medications   atorvastatin (LIPITOR) 10 MG tablet    Sig: Take 1 tablet (10 mg total) by mouth daily.    Dispense:  90 tablet    Refill:  1   LORazepam (ATIVAN) 1 MG tablet    Sig: Take 0.5-1 tablets (0.5-1 mg total) by mouth 2 (two) times daily as needed. For throat spasms    Dispense:  60 tablet    Refill:  1   Patient Instructions  Call gastroenterology as you appear to be due this year for colonoscopy.  No med changes today.  I do recommend repeat mammogram - if normal, then may decide not to repeat after this year.  I recommend shingrix vaccine through your pharmacy.   We recommend that you schedule a mammogram for breast cancer screening. Typically, you do not need a referral to do this. Please contact a local imaging center to schedule your mammogram. [facility] ([place]) - [phone] or [phone]   Thanks for coming in today.   Health Maintenance After Age 69 After age 70, you are at a higher risk for certain long-term diseases and infections as well  as injuries from falls. Falls are a major cause of broken bones and head injuries in people who are older than age 54. Getting regular preventive care can help to keep you healthy and well. Preventive care includes getting regular testing and making lifestyle changes as recommended by your health care provider. Talk with your health care provider about: Which screenings and tests you should have. A screening is a test that checks for a disease when you have no symptoms. A diet and exercise plan that is right for you. What should I know about screenings and tests to prevent falls? Screening and testing are the best ways to find a health problem early. Early diagnosis and treatment give you the best chance of managing medical conditions that are common after age 74. Certain conditions and lifestyle choices may make you more likely to have a fall. Your health care provider may recommend: Regular vision checks. Poor vision and conditions such as cataracts can make you more likely to have a fall. If you wear glasses, make sure  to get your prescription updated if your vision changes. Medicine review. Work with your health care provider to regularly review all of the medicines you are taking, including over-the-counter medicines. Ask your health care provider about any side effects that may make you more likely to have a fall. Tell your health care provider if any medicines that you take make you feel dizzy or sleepy. Strength and balance checks. Your health care provider may recommend certain tests to check your strength and balance while standing, walking, or changing positions. Foot health exam. Foot pain and numbness, as well as not wearing proper footwear, can make you more likely to have a fall. Screenings, including: Osteoporosis screening. Osteoporosis is a condition that causes the bones to get weaker and break more easily. Blood pressure screening. Blood pressure changes and medicines to control blood  pressure can make you feel dizzy. Depression screening. You may be more likely to have a fall if you have a fear of falling, feel depressed, or feel unable to do activities that you used to do. Alcohol use screening. Using too much alcohol can affect your balance and may make you more likely to have a fall. Follow these instructions at home: Lifestyle Do not drink alcohol if: Your health care provider tells you not to drink. If you drink alcohol: Limit how much you have to: 0-1 drink a day for women. 0-2 drinks a day for men. Know how much alcohol is in your drink. In the [place] one drink equals one 12 oz bottle of beer (355 mL), one 5 oz glass of wine (148 mL), or one 1 oz glass of hard liquor (44 mL). Do not use any products that contain nicotine or tobacco. These products include cigarettes, chewing tobacco, and vaping devices, such as e-cigarettes. If you need help quitting, ask your health care provider. Activity  Follow a regular exercise program to stay fit. This will help you maintain your balance. Ask your health care provider what types of exercise are appropriate for you. If you need a cane or walker, use it as recommended by your health care provider. Wear supportive shoes that have nonskid soles. Safety  Remove any tripping hazards, such as rugs, cords, and clutter. Install safety equipment such as grab bars in bathrooms and safety rails on stairs. Keep rooms and walkways well-lit. General instructions Talk with your health care provider about your risks for falling. Tell your health care provider if: You fall. Be sure to tell your health care provider about all falls, even ones that seem minor. You feel dizzy, tiredness (fatigue), or off-balance. Take over-the-counter and prescription medicines only as told by your health care provider. These include supplements. Eat a healthy diet and maintain a healthy weight. A healthy diet includes low-fat dairy products, low-fat (lean)  meats, and fiber from whole grains, beans, and lots of fruits and vegetables. Stay current with your vaccines. Schedule regular health, dental, and eye exams. Summary Having a healthy lifestyle and getting preventive care can help to protect your health and wellness after age 71. Screening and testing are the best way to find a health problem early and help you avoid having a fall. Early diagnosis and treatment give you the best chance for managing medical conditions that are more common for people who are older than age 81. Falls are a major cause of broken bones and head injuries in people who are older than age 93. Take precautions to prevent a fall at home. Work with your health  care provider to learn what changes you can make to improve your health and wellness and to prevent falls. This information is not intended to replace advice given to you by your health care provider. Make sure you discuss any questions you have with your health care provider. Document Revised: 08/08/2020 Document Reviewed: 08/08/2020 Elsevier Patient Education  [place],   Merri Ray, MD [place], [facility] 08/23/21 8:34 AM

## 2021-08-23 NOTE — Progress Notes (Signed)
See lab notes.  Hypokalemia.

## 2021-08-25 ENCOUNTER — Telehealth: Payer: Self-pay

## 2021-08-25 NOTE — Telephone Encounter (Signed)
Noted, thanks for update 

## 2021-08-25 NOTE — Telephone Encounter (Signed)
Called pt to verify she received results she did call back but reports she will not be going to get the repeat testing reports she has been eating a lot of potassium rich foods and she is not concerns

## 2021-08-26 ENCOUNTER — Encounter: Payer: Self-pay | Admitting: Family Medicine

## 2021-08-30 NOTE — Telephone Encounter (Signed)
Response from pt in regard to follow up potassium testing where she had declined re test

## 2021-10-26 ENCOUNTER — Encounter: Payer: Self-pay | Admitting: Gastroenterology

## 2022-01-25 ENCOUNTER — Other Ambulatory Visit: Payer: Self-pay | Admitting: Family Medicine

## 2022-01-25 DIAGNOSIS — I10 Essential (primary) hypertension: Secondary | ICD-10-CM

## 2022-02-13 ENCOUNTER — Telehealth: Payer: Self-pay | Admitting: Family Medicine

## 2022-02-13 NOTE — Telephone Encounter (Signed)
Left message for patient to call back and schedule Medicare Annual Wellness Visit (AWV) in office.   If not able to come in office, please offer to do virtually or by telephone.  Left office number and my jabber [phone].  Last AWV:02/16/2021   Please schedule at anytime with Nurse Health Advisor.

## 2022-02-14 ENCOUNTER — Encounter: Payer: Self-pay | Admitting: Family Medicine

## 2022-02-14 ENCOUNTER — Ambulatory Visit (INDEPENDENT_AMBULATORY_CARE_PROVIDER_SITE_OTHER): Payer: Medicare Other | Admitting: Family Medicine

## 2022-02-14 VITALS — BP 138/78 | HR 63 | Temp 98.1°F | Ht 64.0 in | Wt 177.6 lb

## 2022-02-14 DIAGNOSIS — I10 Essential (primary) hypertension: Secondary | ICD-10-CM | POA: Diagnosis not present

## 2022-02-14 DIAGNOSIS — E785 Hyperlipidemia, unspecified: Secondary | ICD-10-CM | POA: Diagnosis not present

## 2022-02-14 DIAGNOSIS — J383 Other diseases of vocal cords: Secondary | ICD-10-CM

## 2022-02-14 DIAGNOSIS — R490 Dysphonia: Secondary | ICD-10-CM | POA: Diagnosis not present

## 2022-02-14 DIAGNOSIS — E876 Hypokalemia: Secondary | ICD-10-CM

## 2022-02-14 LAB — COMPREHENSIVE METABOLIC PANEL
ALT: 27 U/L (ref 0–35)
AST: 19 U/L (ref 0–37)
Albumin: 4.7 g/dL (ref 3.5–5.2)
Alkaline Phosphatase: 76 U/L (ref 39–117)
BUN: 12 mg/dL (ref 6–23)
CO2: 33 mEq/L — ABNORMAL HIGH (ref 19–32)
Calcium: 10.4 mg/dL (ref 8.4–10.5)
Chloride: 100 mEq/L (ref 96–112)
Creatinine, Ser: 0.86 mg/dL (ref 0.40–1.20)
GFR: 65.61 mL/min (ref >60.00)
Glucose, Bld: 94 mg/dL (ref 70–99)
Potassium: 4.6 mEq/L (ref 3.5–5.1)
Sodium: 139 mEq/L (ref 135–145)
Total Bilirubin: 0.6 mg/dL (ref 0.2–1.2)
Total Protein: 7.5 g/dL (ref 6.0–8.3)

## 2022-02-14 LAB — LIPID PANEL
Cholesterol: 196 mg/dL (ref 0–200)
HDL: 58.3 mg/dL (ref >39.00)
LDL Cholesterol: 99 mg/dL (ref 0–99)
NonHDL: 137.43
Total CHOL/HDL Ratio: 3
Triglycerides: 190 mg/dL — ABNORMAL HIGH (ref 0.0–149.0)
VLDL: 38 mg/dL (ref 0.0–40.0)

## 2022-02-14 MED ORDER — LORAZEPAM 1 MG PO TABS: 0.5000 mg | ORAL_TABLET | Freq: Two times a day (BID) | ORAL | 1 refills | Status: DC | PRN

## 2022-02-14 MED ORDER — ATORVASTATIN CALCIUM 10 MG PO TABS: 10.0000 mg | ORAL_TABLET | Freq: Every day | ORAL | 1 refills | Status: DC

## 2022-02-14 MED ORDER — HYDROCHLOROTHIAZIDE 25 MG PO TABS: 25.0000 mg | ORAL_TABLET | Freq: Every day | ORAL | 3 refills | Status: DC

## 2022-02-14 NOTE — Patient Instructions (Addendum)
Keep a record of your blood pressures outside of the office and if over 140/90 we need to make some med changes.  No med changes today.  Let me know if more frequent lorazepam need and we can discuss other options if needed.  Hang in there.   Managing Your Hypertension Hypertension, also called high blood pressure, is when the force of the blood pressing against the walls of the arteries is too strong. Arteries are blood vessels that carry blood from your heart throughout your body. Hypertension forces the heart to work harder to pump blood and may cause the arteries to become narrow or stiff. Understanding blood pressure readings A blood pressure reading includes a higher number over a lower number: The first, or top, number is called the systolic pressure. It is a measure of the pressure in your arteries as your heart beats. The second, or bottom number, is called the diastolic pressure. It is a measure of the pressure in your arteries as the heart relaxes. For most people, a normal blood pressure is below 120/80. Your personal target blood pressure may vary depending on your medical conditions, your age, and other factors. Blood pressure is classified into four stages. Based on your blood pressure reading, your health care provider may use the following stages to determine what type of treatment you need, if any. Systolic pressure and diastolic pressure are measured in a unit called millimeters of mercury (mmHg). Normal Systolic pressure: below 621. Diastolic pressure: below 80. Elevated Systolic pressure: 308-657. Diastolic pressure: below 80. Hypertension stage 1 Systolic pressure: 846-962. Diastolic pressure: 95-28. Hypertension stage 2 Systolic pressure: 413 or above. Diastolic pressure: 90 or above. How can this condition affect me? Managing your hypertension is very important. Over time, hypertension can damage the arteries and decrease blood flow to parts of the body, including the  brain, heart, and kidneys. Having untreated or uncontrolled hypertension can lead to: A heart attack. A stroke. A weakened blood vessel (aneurysm). Heart failure. Kidney damage. Eye damage. Memory and concentration problems. Vascular dementia. What actions can I take to manage this condition? Hypertension can be managed by making lifestyle changes and possibly by taking medicines. Your health care provider will help you make a plan to bring your blood pressure within a normal range. You may be referred for counseling on a healthy diet and physical activity. Nutrition  Eat a diet that is high in fiber and potassium, and low in salt (sodium), added sugar, and fat. An example eating plan is called the DASH diet. DASH stands for Dietary Approaches to Stop Hypertension. To eat this way: Eat plenty of fresh fruits and vegetables. Try to fill one-half of your plate at each meal with fruits and vegetables. Eat whole grains, such as whole-wheat pasta, brown rice, or whole-grain bread. Fill about one-fourth of your plate with whole grains. Eat low-fat dairy products. Avoid fatty cuts of meat, processed or cured meats, and poultry with skin. Fill about one-fourth of your plate with lean proteins such as fish, chicken without skin, beans, eggs, and tofu. Avoid pre-made and processed foods. These tend to be higher in sodium, added sugar, and fat. Reduce your daily sodium intake. Many people with hypertension should eat less than 1,500 mg of sodium a day. Lifestyle  Work with your health care provider to maintain a healthy body weight or to lose weight. Ask what an ideal weight is for you. Get at least 30 minutes of exercise that causes your heart to beat faster (  aerobic exercise) most days of the week. Activities may include walking, swimming, or biking. Include exercise to strengthen your muscles (resistance exercise), such as weight lifting, as part of your weekly exercise routine. Try to do these types  of exercises for 30 minutes at least 3 days a week. Do not use any products that contain nicotine or tobacco. These products include cigarettes, chewing tobacco, and vaping devices, such as e-cigarettes. If you need help quitting, ask your health care provider. Control any long-term (chronic) conditions you have, such as high cholesterol or diabetes. Identify your sources of stress and find ways to manage stress. This may include meditation, deep breathing, or making time for fun activities. Alcohol use Do not drink alcohol if: Your health care provider tells you not to drink. You are pregnant, may be pregnant, or are planning to become pregnant. If you drink alcohol: Limit how much you have to: 0-1 drink a day for women. 0-2 drinks a day for men. Know how much alcohol is in your drink. In the [place] one drink equals one 12 oz bottle of beer (355 mL), one 5 oz glass of wine (148 mL), or one 1 oz glass of hard liquor (44 mL). Medicines Your health care provider may prescribe medicine if lifestyle changes are not enough to get your blood pressure under control and if: Your systolic blood pressure is 130 or higher. Your diastolic blood pressure is 80 or higher. Take medicines only as told by your health care provider. Follow the directions carefully. Blood pressure medicines must be taken as told by your health care provider. The medicine does not work as well when you skip doses. Skipping doses also puts you at risk for problems. Monitoring Before you monitor your blood pressure: Do not smoke, drink caffeinated beverages, or exercise within 30 minutes before taking a measurement. Use the bathroom and empty your bladder (urinate). Sit quietly for at least 5 minutes before taking measurements. Monitor your blood pressure at home as told by your health care provider. To do this: Sit with your back straight and supported. Place your feet flat on the floor. Do not cross your legs. Support your arm  on a flat surface, such as a table. Make sure your upper arm is at heart level. Each time you measure, take two or three readings one minute apart and record the results. You may also need to have your blood pressure checked regularly by your health care provider. General information Talk with your health care provider about your diet, exercise habits, and other lifestyle factors that may be contributing to hypertension. Review all the medicines you take with your health care provider because there may be side effects or interactions. Keep all follow-up visits. Your health care provider can help you create and adjust your plan for managing your high blood pressure. Where to find more information National Heart, [employer], and [employer] Institute: https://wilson-eaton.com/ American Heart Association: www.heart.org Contact a health care provider if: You think you are having a reaction to medicines you have taken. You have repeated (recurrent) headaches. You feel dizzy. You have swelling in your ankles. You have trouble with your vision. Get help right away if: You develop a severe headache or confusion. You have unusual weakness or numbness, or you feel faint. You have severe pain in your chest or abdomen. You vomit repeatedly. You have trouble breathing. These symptoms may be an emergency. Get help right away. Call 911. Do not wait to see if the symptoms will go  away. Do not drive yourself to the hospital. Summary Hypertension is when the force of blood pumping through your arteries is too strong. If this condition is not controlled, it may put you at risk for serious complications. Your personal target blood pressure may vary depending on your medical conditions, your age, and other factors. For most people, a normal blood pressure is less than 120/80. Hypertension is managed by lifestyle changes, medicines, or both. Lifestyle changes to help manage hypertension include losing weight, eating a healthy,  low-sodium diet, exercising more, stopping smoking, and limiting alcohol. This information is not intended to replace advice given to you by your health care provider. Make sure you discuss any questions you have with your health care provider. Document Revised: 12/01/2020 Document Reviewed: 12/01/2020 [employer] Patient Education  [place].

## 2022-02-14 NOTE — Progress Notes (Signed)
Subjective:  Patient ID: Erin Mckee, female    DOB: January 31, 1946  Age: 76 y.o. MRN: [id number]  CC:  Chief Complaint  Patient presents with   Hyperlipidemia    Pt states all is well   Hypertension    HPI [name] presents for   Hyperlipidemia: Lipitor 10 mg daily started November last year.  Daily use without myalgias or new side effects.  Stable control in May. Fasting today.  Lab Results  Component Value Date   CHOL 175 08/23/2021   HDL 58.00 08/23/2021   LDLCALC 90 08/23/2021   TRIG 136.0 08/23/2021   CHOLHDL 3 08/23/2021   Lab Results  Component Value Date   ALT 26 08/23/2021   AST 22 08/23/2021   ALKPHOS 84 08/23/2021   BILITOT 0.8 08/23/2021   Hypertension: HCTZ 25 mg daily, Toprol-XL 50 mg daily.  Hypokalemia noted on prior labs. ? Extra dose of hctz prior.  3.2 on May 24. Plan for lab only visit to recheck, was not done. No new side effects.  Home readings: rarely - 130/70.  Some camping over the summer, less diet adherence. [place], [place] was fantastic.   Wt Readings from Last 3 Encounters:  02/14/22 177 lb 9.6 oz (80.6 kg)  08/23/21 176 lb 9.6 oz (80.1 kg)  02/13/21 179 lb 12.8 oz (81.6 kg)   BP Readings from Last 3 Encounters:  02/14/22 138/78  08/23/21 118/66  02/13/21 136/68   Lab Results  Component Value Date   CREATININE 0.81 08/23/2021   Dysphonia with vocal cord dysfunction Stable with intermittent use of Ativan and, one half of milligram dose during flares.  Typically at work.  Rare use outside of work, discussed in May.  Controlled substance database reviewed, lorazepam 1 mg #60 on 08/23/2021, previously 02/13/2021. More frequent flares since visit. Only working 3-4d/month. Some days needing more - some stress with nephew in serious car accident in [place],  Denies need for resources at this time.  HM: Discussed shingrix.  Covid booster - recommended Colonoscopy - received letter to schedule - she will call.     History Patient Active Problem List   Diagnosis Date Noted   Distal radius fracture, right 02/14/2012   Hyperlipidemia 09/13/2011   HYPERTENSION 01/15/2008   VOCAL CORD DISORDER 01/15/2008   DYSPNEA 01/15/2008   PALPITATIONS, HX OF 01/15/2008   Past Medical History:  Diagnosis Date   GERD (gastroesophageal reflux disease)    spasmodic dysphonia   Heart murmur    resolved- related to stress of throat closing , echo  done 2009   Hepatitis    hep- a as a child   Hyperlipidemia    Hypertension    PCP manages htn, pt. also reports that she has seen Cardiology  at [facility] too.    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   Past Surgical History:  Procedure Laterality Date   CATARACT EXTRACTION Bilateral    june 2019 and january 2022   COLONOSCOPY     FRACTURE SURGERY     OPEN REDUCTION INTERNAL FIXATION (ORIF) DISTAL RADIAL FRACTURE  02/14/2012   Procedure: OPEN REDUCTION INTERNAL FIXATION (ORIF) DISTAL RADIAL FRACTURE;  Surgeon: Mcarthur Rossetti, MD;  Location: [facility];  Service: Orthopedics;  Laterality: Right;  Open Reduction Internal Fixation Right Wrist   ORIF ANKLE FRACTURE Left 06/30/2019   Procedure: OPEN REDUCTION INTERNAL FIXATION (ORIF) LEFT ANKLE FRACTURE;  Surgeon: Meredith Pel, MD;  Location: [facility];  Service: Orthopedics;  Laterality: Left;   TONSILLECTOMY     2009   No Known Allergies Prior to Admission medications   Medication Sig Start Date End Date Taking? Authorizing Provider  atorvastatin (LIPITOR) 10 MG tablet Take 1 tablet (10 mg total) by mouth daily. 08/23/21  Yes [name], MD  hydrochlorothiazide (HYDRODIURIL) 25 MG tablet Take 1 tablet (25 mg total) by mouth daily. 02/13/21  Yes [name], MD  LORazepam (ATIVAN) 1 MG tablet Take 0.5-1 tablets (0.5-1 mg total) by mouth 2 (two) times daily as needed. For throat spasms 08/23/21  Yes [name], MD   metoprolol succinate (TOPROL-XL) 50 MG 24 hr tablet TAKE 1 TABLET(50 MG) BY MOUTH DAILY 01/25/22  Yes [name], MD   Social History   Socioeconomic History   Marital status: Married    Spouse name: Not on file   Number of children: 2   Years of education: Not on file   Highest education level: Professional school degree (e.g., MD, DDS, DVM, JD)  Occupational History   Occupation: pharmacist    Comment: retired 2016; now PRN    Employer: [employer]  Tobacco Use   Smoking status: Never   Smokeless tobacco: Never  Vaping Use   Vaping Use: Never used  Substance and Sexual Activity   Alcohol use: Yes    Alcohol/week: 6.0 standard drinks of alcohol    Types: 6 Glasses of wine per week    Comment: Occasional   Drug use: No   Sexual activity: Yes    Birth control/protection: Post-menopausal  Other Topics Concern   Not on file  Social History Narrative   Industrial/product designer   Married   Exercises   Social Determinants of Health   Financial Resource Strain: Low Risk  (02/16/2021)   Overall Financial Resource Strain (CARDIA)    Difficulty of Paying Living Expenses: Not hard at all  Food Insecurity: No Food Insecurity (02/16/2021)   Hunger Vital Sign    Worried About Running Out of Food in the Last Year: Never true    Ran Out of Food in the Last Year: Never true  Transportation Needs: No Transportation Needs (02/16/2021)   PRAPARE - Hydrologist (Medical): No    Lack of Transportation (Non-Medical): No  Physical Activity: Sufficiently Active (02/16/2021)   Exercise Vital Sign    Days of Exercise per Week: 5 days    Minutes of Exercise per Session: 60 min  Stress: No Stress Concern Present (02/16/2021)   [place]    Feeling of Stress : Not at all  Social Connections: Moderately Integrated (02/16/2021)   Social Connection and Isolation Panel [NHANES]    Frequency of  Communication with Friends and Family: Twice a week    Frequency of Social Gatherings with Friends and Family: Twice a week    Attends Religious Services: Never    Marine scientist or Organizations: Yes    Attends Music therapist: More than 4 times per year    Marital Status: Married  Human resources officer Violence: Not At Risk (02/16/2021)   Humiliation, Afraid, Rape, and Kick questionnaire    Fear of Current or Ex-Partner: No    Emotionally Abused: No    Physically Abused: No    Sexually Abused: No    Review of Systems  Constitutional:  Negative  for fatigue and unexpected weight change.  Respiratory:  Negative for chest tightness and shortness of breath.   Cardiovascular:  Negative for chest pain, palpitations and leg swelling.  Gastrointestinal:  Negative for abdominal pain and blood in stool.  Neurological:  Negative for dizziness, syncope, light-headedness and headaches.     Objective:   Vitals:   02/14/22 0809  BP: 138/78  Pulse: 63  Temp: 98.1 F (36.7 C)  SpO2: 94%  Weight: 177 lb 9.6 oz (80.6 kg)  Height: '5\' 4"'$  (1.626 m)     Physical Exam Vitals reviewed.  Constitutional:      Appearance: Normal appearance. She is well-developed.  HENT:     Head: Normocephalic and atraumatic.  Eyes:     Conjunctiva/sclera: Conjunctivae normal.     Pupils: Pupils are equal, round, and reactive to light.  Neck:     Vascular: No carotid bruit.  Cardiovascular:     Rate and Rhythm: Normal rate and regular rhythm.     Heart sounds: Normal heart sounds.  Pulmonary:     Effort: Pulmonary effort is normal.     Breath sounds: Normal breath sounds.  Abdominal:     Palpations: Abdomen is soft. There is no pulsatile mass.     Tenderness: There is no abdominal tenderness.  Musculoskeletal:     Right lower leg: No edema.     Left lower leg: No edema.  Skin:    General: Skin is warm and dry.  Neurological:     Mental Status: She is alert and oriented to person,  place, and time.  Psychiatric:        Mood and Affect: Mood normal.        Behavior: Behavior normal.        Assessment & Plan:  NATALEA SUTLIFF is a 76 y.o. female . Essential hypertension - Plan: hydrochlorothiazide (HYDRODIURIL) 25 MG tablet, Comprehensive metabolic panel  -  overall stable, tolerating current regimen.  Home monitoring discussed, handout given on management of hypertension, RTC precautions.  Continue Toprol, HCTZ same doses.  Medications refilled. Labs pending as above.   Hyperlipidemia, unspecified hyperlipidemia type - Plan: atorvastatin (LIPITOR) 10 MG tablet, Comprehensive metabolic panel, Lipid panel  -  Stable, tolerating current regimen. Medications refilled. Labs pending as above.   Vocal cord dysfunction - Plan: LORazepam (ATIVAN) 1 MG tablet Dysphonia - Plan: LORazepam (ATIVAN) 1 MG tablet  -Some intermittent worsening with recent stressors as above.  Denies need for new medications or other resources.  RTC precautions.  Controlled substance reviewed as above without concerns, lorazepam refilled.  Hypokalemia - Plan: Comprehensive metabolic panel  -Repeat labs.  Potentially related to HCTZ.  Over-the-counter supplement versus potassium rich foods versus prescription potassium based on readings.     Meds ordered this encounter  Medications   atorvastatin (LIPITOR) 10 MG tablet    Sig: Take 1 tablet (10 mg total) by mouth daily.    Dispense:  90 tablet    Refill:  1   hydrochlorothiazide (HYDRODIURIL) 25 MG tablet    Sig: Take 1 tablet (25 mg total) by mouth daily.    Dispense:  90 tablet    Refill:  3   LORazepam (ATIVAN) 1 MG tablet    Sig: Take 0.5-1 tablets (0.5-1 mg total) by mouth 2 (two) times daily as needed. For throat spasms    Dispense:  60 tablet    Refill:  1   Patient Instructions  Keep a record of your blood pressures  outside of the office and if over 140/90 we need to make some med changes.  No med changes today.  Let me know  if more frequent lorazepam need and we can discuss other options if needed.  Hang in there.   Managing Your Hypertension Hypertension, also called high blood pressure, is when the force of the blood pressing against the walls of the arteries is too strong. Arteries are blood vessels that carry blood from your heart throughout your body. Hypertension forces the heart to work harder to pump blood and may cause the arteries to become narrow or stiff. Understanding blood pressure readings A blood pressure reading includes a higher number over a lower number: The first, or top, number is called the systolic pressure. It is a measure of the pressure in your arteries as your heart beats. The second, or bottom number, is called the diastolic pressure. It is a measure of the pressure in your arteries as the heart relaxes. For most people, a normal blood pressure is below 120/80. Your personal target blood pressure may vary depending on your medical conditions, your age, and other factors. Blood pressure is classified into four stages. Based on your blood pressure reading, your health care provider may use the following stages to determine what type of treatment you need, if any. Systolic pressure and diastolic pressure are measured in a unit called millimeters of mercury (mmHg). Normal Systolic pressure: below 742. Diastolic pressure: below 80. Elevated Systolic pressure: 595-638. Diastolic pressure: below 80. Hypertension stage 1 Systolic pressure: 756-433. Diastolic pressure: 29-51. Hypertension stage 2 Systolic pressure: 884 or above. Diastolic pressure: 90 or above. How can this condition affect me? Managing your hypertension is very important. Over time, hypertension can damage the arteries and decrease blood flow to parts of the body, including the brain, heart, and kidneys. Having untreated or uncontrolled hypertension can lead to: A heart attack. A stroke. A weakened blood vessel  (aneurysm). Heart failure. Kidney damage. Eye damage. Memory and concentration problems. Vascular dementia. What actions can I take to manage this condition? Hypertension can be managed by making lifestyle changes and possibly by taking medicines. Your health care provider will help you make a plan to bring your blood pressure within a normal range. You may be referred for counseling on a healthy diet and physical activity. Nutrition  Eat a diet that is high in fiber and potassium, and low in salt (sodium), added sugar, and fat. An example eating plan is called the DASH diet. DASH stands for Dietary Approaches to Stop Hypertension. To eat this way: Eat plenty of fresh fruits and vegetables. Try to fill one-half of your plate at each meal with fruits and vegetables. Eat whole grains, such as whole-wheat pasta, brown rice, or whole-grain bread. Fill about one-fourth of your plate with whole grains. Eat low-fat dairy products. Avoid fatty cuts of meat, processed or cured meats, and poultry with skin. Fill about one-fourth of your plate with lean proteins such as fish, chicken without skin, beans, eggs, and tofu. Avoid pre-made and processed foods. These tend to be higher in sodium, added sugar, and fat. Reduce your daily sodium intake. Many people with hypertension should eat less than 1,500 mg of sodium a day. Lifestyle  Work with your health care provider to maintain a healthy body weight or to lose weight. Ask what an ideal weight is for you. Get at least 30 minutes of exercise that causes your heart to beat faster (aerobic exercise) most days of the  week. Activities may include walking, swimming, or biking. Include exercise to strengthen your muscles (resistance exercise), such as weight lifting, as part of your weekly exercise routine. Try to do these types of exercises for 30 minutes at least 3 days a week. Do not use any products that contain nicotine or tobacco. These products include  cigarettes, chewing tobacco, and vaping devices, such as e-cigarettes. If you need help quitting, ask your health care provider. Control any long-term (chronic) conditions you have, such as high cholesterol or diabetes. Identify your sources of stress and find ways to manage stress. This may include meditation, deep breathing, or making time for fun activities. Alcohol use Do not drink alcohol if: Your health care provider tells you not to drink. You are pregnant, may be pregnant, or are planning to become pregnant. If you drink alcohol: Limit how much you have to: 0-1 drink a day for women. 0-2 drinks a day for men. Know how much alcohol is in your drink. In the [place] one drink equals one 12 oz bottle of beer (355 mL), one 5 oz glass of wine (148 mL), or one 1 oz glass of hard liquor (44 mL). Medicines Your health care provider may prescribe medicine if lifestyle changes are not enough to get your blood pressure under control and if: Your systolic blood pressure is 130 or higher. Your diastolic blood pressure is 80 or higher. Take medicines only as told by your health care provider. Follow the directions carefully. Blood pressure medicines must be taken as told by your health care provider. The medicine does not work as well when you skip doses. Skipping doses also puts you at risk for problems. Monitoring Before you monitor your blood pressure: Do not smoke, drink caffeinated beverages, or exercise within 30 minutes before taking a measurement. Use the bathroom and empty your bladder (urinate). Sit quietly for at least 5 minutes before taking measurements. Monitor your blood pressure at home as told by your health care provider. To do this: Sit with your back straight and supported. Place your feet flat on the floor. Do not cross your legs. Support your arm on a flat surface, such as a table. Make sure your upper arm is at heart level. Each time you measure, take two or three readings  one minute apart and record the results. You may also need to have your blood pressure checked regularly by your health care provider. General information Talk with your health care provider about your diet, exercise habits, and other lifestyle factors that may be contributing to hypertension. Review all the medicines you take with your health care provider because there may be side effects or interactions. Keep all follow-up visits. Your health care provider can help you create and adjust your plan for managing your high blood pressure. Where to find more information National Heart, Lung, and Blood Institute: https://wilson-eaton.com/ American Heart Association: www.heart.org Contact a health care provider if: You think you are having a reaction to medicines you have taken. You have repeated (recurrent) headaches. You feel dizzy. You have swelling in your ankles. You have trouble with your vision. Get help right away if: You develop a severe headache or confusion. You have unusual weakness or numbness, or you feel faint. You have severe pain in your chest or abdomen. You vomit repeatedly. You have trouble breathing. These symptoms may be an emergency. Get help right away. Call 911. Do not wait to see if the symptoms will go away. Do not drive yourself to  the hospital. Summary Hypertension is when the force of blood pumping through your arteries is too strong. If this condition is not controlled, it may put you at risk for serious complications. Your personal target blood pressure may vary depending on your medical conditions, your age, and other factors. For most people, a normal blood pressure is less than 120/80. Hypertension is managed by lifestyle changes, medicines, or both. Lifestyle changes to help manage hypertension include losing weight, eating a healthy, low-sodium diet, exercising more, stopping smoking, and limiting alcohol. This information is not intended to replace advice given  to you by your health care provider. Make sure you discuss any questions you have with your health care provider. Document Revised: 12/01/2020 Document Reviewed: 12/01/2020 Elsevier Patient Education  2023 [place],   Merri Ray, MD [place], [facility] 02/14/22 8:56 AM

## 2022-03-14 ENCOUNTER — Ambulatory Visit (INDEPENDENT_AMBULATORY_CARE_PROVIDER_SITE_OTHER): Payer: Medicare Other | Admitting: *Deleted

## 2022-03-14 DIAGNOSIS — Z0000 Encounter for general adult medical examination without abnormal findings: Secondary | ICD-10-CM | POA: Diagnosis not present

## 2022-03-14 DIAGNOSIS — Z1231 Encounter for screening mammogram for malignant neoplasm of breast: Secondary | ICD-10-CM | POA: Diagnosis not present

## 2022-03-14 DIAGNOSIS — Z1211 Encounter for screening for malignant neoplasm of colon: Secondary | ICD-10-CM

## 2022-03-14 DIAGNOSIS — H26492 Other secondary cataract, left eye: Secondary | ICD-10-CM | POA: Diagnosis not present

## 2022-03-14 DIAGNOSIS — Z961 Presence of intraocular lens: Secondary | ICD-10-CM | POA: Diagnosis not present

## 2022-03-14 NOTE — Progress Notes (Signed)
Subjective:   Erin Mckee is a 76 y.o. female who presents for Medicare Annual (Subsequent) preventive examination.  I connected with  Gennie Alma on 03/14/22 by a telephone enabled telemedicine application and verified that I am speaking with the correct person using two identifiers.   I discussed the limitations of evaluation and management by telemedicine. The patient expressed understanding and agreed to proceed.  Patient location: home  Provider location:  Tele-health- office    Review of Systems     Cardiac Risk Factors include: advanced age (>41mn, >>44women)     Objective:    Today's Vitals   There is no height or weight on file to calculate BMI.     08/23/2021    8:11 AM 02/16/2021    3:08 PM 07/07/2019   10:06 AM 06/30/2019    2:35 PM 04/29/2018    8:22 AM 04/26/2017    8:43 AM 09/15/2015    3:37 PM  Advanced Directives  Does Patient Have a Medical Advance Directive? No No No No Yes No No  Type of Advance Directive     Living will    Does patient want to make changes to medical advance directive?     No - Patient declined    Would patient like information on creating a medical advance directive? No - Patient declined No - Patient declined Yes (ED - Information included in AVS) No - Patient declined  Yes (MAU/Ambulatory/Procedural Areas - Information given) Yes - Educational materials given    Current Medications (verified) Outpatient Encounter Medications as of 03/14/2022  Medication Sig   atorvastatin (LIPITOR) 10 MG tablet Take 1 tablet (10 mg total) by mouth daily.   hydrochlorothiazide (HYDRODIURIL) 25 MG tablet Take 1 tablet (25 mg total) by mouth daily.   LORazepam (ATIVAN) 1 MG tablet Take 0.5-1 tablets (0.5-1 mg total) by mouth 2 (two) times daily as needed. For throat spasms   metoprolol succinate (TOPROL-XL) 50 MG 24 hr tablet TAKE 1 TABLET(50 MG) BY MOUTH DAILY   No facility-administered encounter medications on file as of 03/14/2022.     Allergies (verified) Patient has no known allergies.   History: Past Medical History:  Diagnosis Date   GERD (gastroesophageal reflux disease)    spasmodic dysphonia   Heart murmur    resolved- related to stress of throat closing , echo  done 2009   Hepatitis    hep- a as a child   Hyperlipidemia    Hypertension    PCP manages htn, pt. [facility]    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   Past Surgical History:  Procedure Laterality Date   CATARACT EXTRACTION Bilateral    june 2019 and january 2022   COLONOSCOPY     FRACTURE SURGERY     OPEN REDUCTION INTERNAL FIXATION (ORIF) DISTAL RADIAL FRACTURE  02/14/2012   Procedure: OPEN REDUCTION INTERNAL FIXATION (ORIF) DISTAL RADIAL FRACTURE;  Surgeon: C[place]arthur Rossetti MD;  [place]  Service: [place]opedics;  Laterality: Right;  Open Reduction Internal Fixation Right Wrist   ORIF ANKLE FRACTURE Left 06/30/2019   Procedure: OPEN REDUCTION INTERNAL FIXATION (ORIF) LEFT ANKLE FRACTURE;  Surgeon: DMeredith Pel MD;  [place]  Service: Orthopedics;  Laterality: Left;   TONSILLECTOMY     2009   Family History  Problem Relation Age of Onset  Hypertension Mother    Heart disease Mother 59       CAD/CABG   Hyperlipidemia Mother    Hypertension Father    Heart disease Father        AMI   Hyperlipidemia Father    Multiple myeloma Father    Hypertension Sister    Thyroid disease Sister    Hypertension Brother    Crohn's disease Brother    Cerebral palsy Son    Hydrocephalus Son    Colon cancer Neg Hx    Social History   Socioeconomic History   Marital status: Married    Spouse name: Not on file   Number of children: 2   Years of education: Not on file   Highest education level: Professional school degree (e.g., MD, DDS, DVM, JD)  Occupational History   Occupation: pharmacist     Comment: retired 2016; now PRN    Employer: [employer]  Tobacco Use   Smoking status: Never   Smokeless tobacco: Never  Vaping Use   Vaping Use: Never used  Substance and Sexual Activity   Alcohol use: Yes    Alcohol/week: 6.0 standard drinks of alcohol    Types: 6 Glasses of wine per week    Comment: Occasional   Drug use: No   Sexual activity: Yes    Birth control/protection: Post-menopausal  Other Topics Concern   Not on file  Social History Narrative   Industrial/product designer   Married   Exercises   Social Determinants of Health   Financial Resource Strain: Low Risk  (03/14/2022)   Overall Financial Resource Strain (CARDIA)    Difficulty of Paying Living Expenses: Not hard at all  Food Insecurity: No Food Insecurity (03/14/2022)   Hunger Vital Sign    Worried About Running Out of Food in the Last Year: Never true    Ran Out of Food in the Last Year: Never true  Transportation Needs: No Transportation Needs (03/14/2022)   PRAPARE - Hydrologist (Medical): No    Lack of Transportation (Non-Medical): No  Physical Activity: Sufficiently Active (03/14/2022)   Exercise Vital Sign    Days of Exercise per Week: 4 days    Minutes of Exercise per Session: 50 min  Stress: No Stress Concern Present (03/14/2022)   [place]    Feeling of Stress : Not at all  Social Connections: [place] (03/14/2022)   Social Connection and Isolation Panel [NHANES]    Frequency of Communication with Friends and Family: Three times a week    Frequency of Social Gatherings with Friends and Family: Once a week    Attends Religious Services: More than 4 times per year    Active Member of [employer] or Organizations: Yes    Attends Music therapist: More than 4 times per year    Marital Status: Married    Tobacco Counseling Counseling given: Not Answered   Clinical  Intake:  Pre-visit preparation completed: Yes  Pain : No/denies pain     Diabetes: No  How often do you need to have someone help you when you read instructions, pamphlets, or other written materials from your doctor or pharmacy?: 1 - Never  Diabetic?   no  Interpreter Needed?: No  Information entered by :: Leroy Kennedy LPN   Activities of Daily Living    03/14/2022   12:06 PM 08/23/2021    8:10 AM  In your  present state of health, do you have any difficulty performing the following activities:  Hearing? 0 0  Vision? 0 0  Difficulty concentrating or making decisions? 0 0  Walking or climbing stairs? 0 0  Dressing or bathing? 0 0  Doing errands, shopping? 0 0  Preparing Food and eating ? N   Using the Toilet? N   In the past six months, have you accidently leaked urine? N   Do you have problems with loss of bowel control? N   Managing your Medications? N   Managing your Finances? N   Housekeeping or managing your Housekeeping? N     Patient Care Team: [name], MD as PCP - General (Family Medicine)  Indicate any recent Medical Services you may have received from other than [facility] providers in the past year (date may be approximate).     Assessment:   This is a routine wellness examination for [place].  Hearing/Vision screen Hearing Screening - Comments:: No trouble heaing Vision Screening - Comments:: Up to date mcuen  Dietary issues and exercise activities discussed: Current Exercise Habits: Home exercise routine, Type of exercise: yoga (horses), Time (Minutes): 50, Frequency (Times/Week): 1, Weekly Exercise (Minutes/Week): 50, Intensity: Mild   Goals Addressed   None    Depression Screen    03/14/2022   12:11 PM 02/14/2022    8:08 AM 08/23/2021    8:07 AM 02/16/2021    3:09 PM 02/16/2021    3:07 PM 02/13/2021    9:08 AM 08/15/2020    8:21 AM  PHQ 2/9 Scores  PHQ - 2 Score 0 0 0 0 0 0 0  PHQ- 9 Score 0 0 1        Fall Risk    03/14/2022    12:05 PM 02/14/2022    8:08 AM 08/23/2021    8:07 AM 02/16/2021    3:09 PM 02/13/2021    9:08 AM  [place] in the past year? 0 0 0 0 0  Number falls in past yr: 0 0 0 0 0  Injury with Fall? 0 0 0 0 0  Risk for fall due to :  No Fall Risks No Fall Risks  No Fall Risks  Follow up Falls evaluation completed;Education provided;Falls prevention discussed Falls evaluation completed Falls evaluation completed Falls evaluation completed Falls evaluation completed    FALL RISK PREVENTION PERTAINING TO THE HOME:  Any stairs in or around the home? Yes  If so, are there any without handrails? No  Home free of loose throw rugs in walkways, pet beds, electrical cords, etc? Yes  Adequate lighting in your home to reduce risk of falls? Yes   ASSISTIVE DEVICES UTILIZED TO PREVENT FALLS:  Life alert? No  Use of a cane, walker or w/c? No  Grab bars in the bathroom? Yes  Shower chair or bench in shower? Yes  Elevated toilet seat or a handicapped toilet? Yes   TIMED UP AND GO:  Was the test performed? No .    Cognitive Function:        03/14/2022   12:08 PM 08/23/2021    8:08 AM 07/07/2019   10:06 AM 04/29/2018    8:27 AM 04/26/2017    8:50 AM  6CIT Screen  What Year? 0 points 0 points 0 points 0 points 0 points  What month? 0 points 0 points 0 points 0 points 0 points  What time? 0 points 0 points 0 points 0 points 0 points  Count back from 20 0 points 0 points 2 points 0 points 0 points  Months in reverse 0 points 0 points 0 points 0 points 0 points  Repeat phrase 0 points 0 points 0 points 0 points 0 points  Total Score 0 points 0 points 2 points 0 points 0 points    Immunizations Immunization History  Administered Date(s) Administered   Fluad Quad(high Dose 65+) 01/17/2022   Hepatitis B 04/14/1997   Influenza Split 01/17/2012, 12/15/2013   PFIZER(Purple Top)SARS-COV-2 Vaccination 04/15/2019, 05/06/2019   Pneumococcal Conjugate-13 02/22/2014   Pneumococcal  Polysaccharide-23 04/14/2012   Td 09/09/2018   Tdap 04/02/2008   Zoster, Live 02/01/2015    TDAP status: Up to date  Flu Vaccine status: Up to date  Pneumococcal vaccine status: Up to date  Covid-19 vaccine status: Declined, Education has been provided regarding the importance of this vaccine but patient still declined. Advised may receive this vaccine at local pharmacy or Health Dept.or vaccine clinic. Aware to provide a copy of the vaccination record if obtained from local pharmacy or Health Dept. Verbalized acceptance and understanding.  Qualifies for Shingles Vaccine? Yes   Zostavax completed Yes   Shingrix Completed?: No.    Education has been provided regarding the importance of this vaccine. Patient has been advised to call insurance company to determine out of pocket expense if they have not yet received this vaccine. Advised may also receive vaccine at local pharmacy or Health Dept. Verbalized acceptance and understanding.  Screening Tests Health Maintenance  Topic Date Due   COVID-19 Vaccine (3 - 2023-24 season) 03/30/2022 (Originally 12/01/2021)   Zoster Vaccines- Shingrix (1 of 2) 05/17/2022 (Originally 09/02/1995)   COLONOSCOPY (Pts 45-37yr Insurance coverage will need to be confirmed)  08/24/2022 (Originally 10/26/2019)   Medicare Annual Wellness (AWV)  03/15/2023   DTaP/Tdap/Td (3 - Td or Tdap) 09/08/2028   Pneumonia Vaccine 76 Years old  Completed   INFLUENZA VACCINE  Completed   DEXA SCAN  Completed   Hepatitis C Screening  Completed   HPV VACCINES  Aged Out    Health Maintenance  There are no preventive care reminders to display for this patient.   Colorectal cancer screening: Referral to GI placed  . Pt aware the office will call re: appt.  Mammogram status: Ordered  . Pt provided with contact info and advised to call to schedule appt.   Bone Density status: Completed 2019. Results reflect: Bone density results: OSTEOPENIA. Repeat every 5 years.  Lung  Cancer Screening: (Low Dose CT Chest recommended if Age 843-80years, 30 pack-year currently smoking OR have quit w/in 15years.) does not qualify.   Lung Cancer Screening Referral:   Additional Screening:  Hepatitis C Screening: does not qualify; Completed 2018  Vision Screening: Recommended annual ophthalmology exams for early detection of glaucoma and other disorders of the eye. Is the patient up to date with their annual eye exam?  Yes  Who is the provider or what is the name of the office in which the patient attends annual eye exams? [facility] If pt is not established with a provider, would they like to be referred to a provider to establish care? No .   Dental Screening: Recommended annual dental exams for proper oral hygiene  Community Resource Referral / Chronic Care Management: CRR required this visit?  No   CCM required this visit?  No      Plan:     I have personally reviewed and noted the following in the patient's chart:  Medical and social history Use of alcohol, tobacco or illicit drugs  Current medications and supplements including opioid prescriptions. Patient is not currently taking opioid prescriptions. Functional ability and status Nutritional status Physical activity Advanced directives List of other physicians Hospitalizations, surgeries, and ER visits in previous 12 months Vitals Screenings to include cognitive, depression, and falls Referrals and appointments  In addition, I have reviewed and discussed with patient certain preventive protocols, quality metrics, and best practice recommendations. A written personalized care plan for preventive services as well as general preventive health recommendations were provided to patient.     Leroy Kennedy, LPN   [id number]   Nurse Notes:

## 2022-03-14 NOTE — Patient Instructions (Signed)
Erin Mckee , Thank you for taking time to come for your Medicare Wellness Visit. I appreciate your ongoing commitment to your health goals. Please review the following plan we discussed and let me know if I can assist you in the future.   These are the goals we discussed:  Goals      Exercise 3x per week (30 min per time)     Patient states that she wants to improve her flexibility and is interested in starting a yoga class.      Patient Stated     Loose weight        This is a list of the screening recommended for you and due dates:  Health Maintenance  Topic Date Due   COVID-19 Vaccine (3 - 2023-24 season) 03/30/2022*   Zoster (Shingles) Vaccine (1 of 2) 05/17/2022*   Colon Cancer Screening  08/24/2022*   Medicare Annual Wellness Visit  03/15/2023   DTaP/Tdap/Td vaccine (3 - Td or Tdap) 09/08/2028   Pneumonia Vaccine  Completed   Flu Shot  Completed   DEXA scan (bone density measurement)  Completed   Hepatitis C Screening: USPSTF Recommendation to screen - Ages 31-79 yo.  Completed   HPV Vaccine  Aged Out  *Topic was postponed. The date shown is not the original due date.    Advanced directives: Education provided       Preventive Care 11 Years and Older, Female Preventive care refers to lifestyle choices and visits with your health care provider that can promote health and wellness. What does preventive care include? A yearly physical exam. This is also called an annual well check. Dental exams once or twice a year. Routine eye exams. Ask your health care provider how often you should have your eyes checked. Personal lifestyle choices, including: Daily care of your teeth and gums. Regular physical activity. Eating a healthy diet. Avoiding tobacco and drug use. Limiting alcohol use. Practicing safe sex. Taking low-dose aspirin every day. Taking vitamin and mineral supplements as recommended by your health care provider. What happens during an annual well  check? The services and screenings done by your health care provider during your annual well check will depend on your age, overall health, lifestyle risk factors, and family history of disease. Counseling  Your health care provider may ask you questions about your: Alcohol use. Tobacco use. Drug use. Emotional well-being. Home and relationship well-being. Sexual activity. Eating habits. History of falls. Memory and ability to understand (cognition). Work and work Statistician. Reproductive health. Screening  You may have the following tests or measurements: Height, weight, and BMI. Blood pressure. Lipid and cholesterol levels. These may be checked every 5 years, or more frequently if you are over 104 years old. Skin check. Lung cancer screening. You may have this screening every year starting at age 8 if you have a 30-pack-year history of smoking and currently smoke or have quit within the past 15 years. Fecal occult blood test (FOBT) of the stool. You may have this test every year starting at age 72. Flexible sigmoidoscopy or colonoscopy. You may have a sigmoidoscopy every 5 years or a colonoscopy every 10 years starting at age 32. Hepatitis C blood test. Hepatitis B blood test. Sexually transmitted disease (STD) testing. Diabetes screening. This is done by checking your blood sugar (glucose) after you have not eaten for a while (fasting). You may have this done every 1-3 years. Bone density scan. This is done to screen for osteoporosis. You may have this  done starting at age 45. Mammogram. This may be done every 1-2 years. Talk to your health care provider about how often you should have regular mammograms. Talk with your health care provider about your test results, treatment options, and if necessary, the need for more tests. Vaccines  Your health care provider may recommend certain vaccines, such as: Influenza vaccine. This is recommended every year. Tetanus, diphtheria, and  acellular pertussis (Tdap, Td) vaccine. You may need a Td booster every 10 years. Zoster vaccine. You may need this after age 74. Pneumococcal 13-valent conjugate (PCV13) vaccine. One dose is recommended after age 37. Pneumococcal polysaccharide (PPSV23) vaccine. One dose is recommended after age 59. Talk to your health care provider about which screenings and vaccines you need and how often you need them. This information is not intended to replace advice given to you by your health care provider. Make sure you discuss any questions you have with your health care provider. Document Released: 04/15/2015 Document Revised: 12/07/2015 Document Reviewed: 01/18/2015 Elsevier Interactive Patient Education  2017 [place] Prevention in the Home Falls can cause injuries. They can happen to people of all ages. There are many things you can do to make your home safe and to help prevent falls. What can I do on the outside of my home? Regularly fix the edges of walkways and driveways and fix any cracks. Remove anything that might make you trip as you walk through a door, such as a raised step or threshold. Trim any bushes or trees on the path to your home. Use bright outdoor lighting. Clear any walking paths of anything that might make someone trip, such as rocks or tools. Regularly check to see if handrails are loose or broken. Make sure that both sides of any steps have handrails. Any raised decks and porches should have guardrails on the edges. Have any leaves, snow, or ice cleared regularly. Use sand or salt on walking paths during winter. Clean up any spills in your garage right away. This includes oil or grease spills. What can I do in the bathroom? Use night lights. Install grab bars by the toilet and in the tub and shower. Do not use towel bars as grab bars. Use non-skid mats or decals in the tub or shower. If you need to sit down in the shower, use a plastic, non-slip stool. Keep  the floor dry. Clean up any water that spills on the floor as soon as it happens. Remove soap buildup in the tub or shower regularly. Attach bath mats securely with double-sided non-slip rug tape. Do not have throw rugs and other things on the floor that can make you trip. What can I do in the bedroom? Use night lights. Make sure that you have a light by your bed that is easy to reach. Do not use any sheets or blankets that are too big for your bed. They should not hang down onto the floor. Have a firm chair that has side arms. You can use this for support while you get dressed. Do not have throw rugs and other things on the floor that can make you trip. What can I do in the kitchen? Clean up any spills right away. Avoid walking on wet floors. Keep items that you use a lot in easy-to-reach places. If you need to reach something above you, use a strong step stool that has a grab bar. Keep electrical cords out of the way. Do not use floor polish or wax  that makes floors slippery. If you must use wax, use non-skid floor wax. Do not have throw rugs and other things on the floor that can make you trip. What can I do with my stairs? Do not leave any items on the stairs. Make sure that there are handrails on both sides of the stairs and use them. Fix handrails that are broken or loose. Make sure that handrails are as long as the stairways. Check any carpeting to make sure that it is firmly attached to the stairs. Fix any carpet that is loose or worn. Avoid having throw rugs at the top or bottom of the stairs. If you do have throw rugs, attach them to the floor with carpet tape. Make sure that you have a light switch at the top of the stairs and the bottom of the stairs. If you do not have them, ask someone to add them for you. What else can I do to help prevent falls? Wear shoes that: Do not have high heels. Have rubber bottoms. Are comfortable and fit you well. Are closed at the toe. Do not  wear sandals. If you use a stepladder: Make sure that it is fully opened. Do not climb a closed stepladder. Make sure that both sides of the stepladder are locked into place. Ask someone to hold it for you, if possible. Clearly mark and make sure that you can see: Any grab bars or handrails. First and last steps. Where the edge of each step is. Use tools that help you move around (mobility aids) if they are needed. These include: Canes. Walkers. Scooters. Crutches. Turn on the lights when you go into a dark area. Replace any light bulbs as soon as they burn out. Set up your furniture so you have a clear path. Avoid moving your furniture around. If any of your floors are uneven, fix them. If there are any pets around you, be aware of where they are. Review your medicines with your doctor. Some medicines can make you feel dizzy. This can increase your chance of falling. Ask your doctor what other things that you can do to help prevent falls. This information is not intended to replace advice given to you by your health care provider. Make sure you discuss any questions you have with your health care provider. Document Released: 01/13/2009 Document Revised: 08/25/2015 Document Reviewed: 04/23/2014 Elsevier Interactive Patient Education  2017 [employer].

## 2022-06-13 ENCOUNTER — Other Ambulatory Visit: Payer: Self-pay | Admitting: Family Medicine

## 2022-06-13 DIAGNOSIS — R490 Dysphonia: Secondary | ICD-10-CM

## 2022-06-13 DIAGNOSIS — I10 Essential (primary) hypertension: Secondary | ICD-10-CM

## 2022-06-13 DIAGNOSIS — J383 Other diseases of vocal cords: Secondary | ICD-10-CM

## 2022-06-14 NOTE — Telephone Encounter (Signed)
Medication discussed at her February 14, 2022 visit.  Controlled substance database reviewed.  Lorazepam 1 mg #60 on 02/14/2022, refill ordered.

## 2022-06-14 NOTE — Telephone Encounter (Signed)
Lorazepam 1 mg LOV: 02/14/22 Last Refill:02/14/22 Upcoming appt: 08/15/22

## 2022-07-21 ENCOUNTER — Other Ambulatory Visit: Payer: Self-pay | Admitting: Family Medicine

## 2022-07-21 DIAGNOSIS — E785 Hyperlipidemia, unspecified: Secondary | ICD-10-CM

## 2022-08-15 ENCOUNTER — Ambulatory Visit (INDEPENDENT_AMBULATORY_CARE_PROVIDER_SITE_OTHER): Payer: Medicare Other | Admitting: Family Medicine

## 2022-08-15 ENCOUNTER — Encounter: Payer: Self-pay | Admitting: Family Medicine

## 2022-08-15 VITALS — BP 136/80 | HR 60 | Temp 97.7°F | Resp 18 | Ht 64.0 in | Wt 182.6 lb

## 2022-08-15 DIAGNOSIS — R490 Dysphonia: Secondary | ICD-10-CM

## 2022-08-15 DIAGNOSIS — E785 Hyperlipidemia, unspecified: Secondary | ICD-10-CM | POA: Diagnosis not present

## 2022-08-15 DIAGNOSIS — J383 Other diseases of vocal cords: Secondary | ICD-10-CM

## 2022-08-15 DIAGNOSIS — I10 Essential (primary) hypertension: Secondary | ICD-10-CM | POA: Diagnosis not present

## 2022-08-15 DIAGNOSIS — Z1211 Encounter for screening for malignant neoplasm of colon: Secondary | ICD-10-CM

## 2022-08-15 LAB — LIPID PANEL
Cholesterol: 175 mg/dL (ref 0–200)
HDL: 55.5 mg/dL (ref >39.00)
LDL Cholesterol: 85 mg/dL (ref 0–99)
NonHDL: 119.79
Total CHOL/HDL Ratio: 3
Triglycerides: 176 mg/dL — ABNORMAL HIGH (ref 0.0–149.0)
VLDL: 35.2 mg/dL (ref 0.0–40.0)

## 2022-08-15 LAB — COMPREHENSIVE METABOLIC PANEL
ALT: 26 U/L (ref 0–35)
AST: 21 U/L (ref 0–37)
Albumin: 4.5 g/dL (ref 3.5–5.2)
Alkaline Phosphatase: 77 U/L (ref 39–117)
BUN: 17 mg/dL (ref 6–23)
CO2: 31 mEq/L (ref 19–32)
Calcium: 10.4 mg/dL (ref 8.4–10.5)
Chloride: 100 mEq/L (ref 96–112)
Creatinine, Ser: 0.9 mg/dL (ref 0.40–1.20)
GFR: 61.91 mL/min (ref >60.00)
Glucose, Bld: 101 mg/dL — ABNORMAL HIGH (ref 70–99)
Potassium: 3.8 mEq/L (ref 3.5–5.1)
Sodium: 139 mEq/L (ref 135–145)
Total Bilirubin: 0.7 mg/dL (ref 0.2–1.2)
Total Protein: 7.3 g/dL (ref 6.0–8.3)

## 2022-08-15 MED ORDER — HYDROCHLOROTHIAZIDE 25 MG PO TABS: ORAL_TABLET | ORAL | 3 refills | Status: DC

## 2022-08-15 MED ORDER — METOPROLOL SUCCINATE ER 50 MG PO TB24
ORAL_TABLET | ORAL | 3 refills | Status: DC
Start: 2022-08-15 — End: 2023-08-08

## 2022-08-15 MED ORDER — ATORVASTATIN CALCIUM 10 MG PO TABS: ORAL_TABLET | ORAL | 3 refills | Status: DC

## 2022-08-15 NOTE — Progress Notes (Signed)
Subjective:  Patient ID: Erin Mckee, female    DOB: 17-Feb-1946  Age: 77 y.o. MRN: [id number]  CC:  Chief Complaint  Patient presents with   Hypertension   Hyperlipidemia   Follow-up    HPI [name] presents for   Hyperlipidemia: Lipitor 10 mg daily.  No new myalgias. no health changes.  Last ate last night.  Lab Results  Component Value Date   CHOL 196 02/14/2022   HDL 58.30 02/14/2022   LDLCALC 99 02/14/2022   TRIG 190.0 (H) 02/14/2022   CHOLHDL 3 02/14/2022   Lab Results  Component Value Date   ALT 27 02/14/2022   AST 19 02/14/2022   ALKPHOS 76 02/14/2022   BILITOT 0.6 02/14/2022   Hypertension: HCTZ 25 mg daily, Toprol-XL 50 mg daily.  Prior hypokalemia, may have been due to extra dose of HCTZ.  Repeat potassium in November performed. Home readings: none.  BP Readings from Last 3 Encounters:  08/15/22 136/80  02/14/22 138/78  08/23/21 118/66   Lab Results  Component Value Date   CREATININE 0.86 02/14/2022   Lab Results  Component Value Date   K 4.6 02/14/2022   Lab Results  Component Value Date   HGBA1C 5.3 08/23/2021   Dysphonia with vocal cord dysfunction Intermittent use of Ativan, typically at work.  Controlled substance database reviewed.  Lorazepam 1 mg #60 last filled 06/15/2022, previously 02/14/2022.  Rare use - taking at work usually - stable. Stress last visit with nephew - recovery has been going well.    Health maintenance Shingles vaccine: has not received - recommended.  Colonoscopy, planned to schedule at her last visit.  Letter reviewed from gastroenterology in January, unable to contact patient to schedule. Has tried to call and long phone wait.   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 also reports that she has seen Cardiology  at [facility] too.    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   Past Surgical History:  Procedure Laterality Date   CATARACT EXTRACTION Bilateral    june 2019 and january 2022   COLONOSCOPY     FRACTURE SURGERY     OPEN REDUCTION INTERNAL FIXATION (ORIF) DISTAL RADIAL FRACTURE  02/14/2012   Procedure: OPEN REDUCTION INTERNAL FIXATION (ORIF) DISTAL RADIAL FRACTURE;  Surgeon: Kathryne Hitch, MD;  Location: [facility] OR;  Service: Orthopedics;  Laterality: Right;  Open Reduction Internal Fixation Right Wrist   ORIF ANKLE FRACTURE Left 06/30/2019   Procedure: OPEN REDUCTION INTERNAL FIXATION (ORIF) LEFT ANKLE FRACTURE;  Surgeon: Cammy Copa, MD;  Location: [facility] OR;  Service: Orthopedics;  Laterality: Left;   TONSILLECTOMY     2009   No Known Allergies Prior to Admission medications   Medication Sig Start Date End Date Taking? Authorizing  Provider  amitriptyline (ELAVIL) 25 MG tablet Take by mouth. 12/15/11  Yes [provider]  atorvastatin (LIPITOR) 10 MG tablet TAKE 1 TABLET(10 MG) BY MOUTH DAILY 07/23/22   Shade Flood, MD  hydrochlorothiazide (HYDRODIURIL) 25 MG tablet TAKE 1 TABLET(25 MG) BY MOUTH DAILY 06/14/22   Shade Flood, MD  LORazepam (ATIVAN) 1 MG tablet TAKE 1/2 TO 1 TABLET(0.5 TO 1 MG) BY MOUTH TWICE DAILY AS NEEDED FOR THROAT SPASMS 06/14/22   Shade Flood, MD  metoprolol succinate (TOPROL-XL) 50 MG 24 hr tablet TAKE 1 TABLET(50 MG) BY MOUTH DAILY 01/25/22   Shade Flood, MD   Social History    Socioeconomic History   Marital status: Married    Spouse name: Not on file   Number of children: 2   Years of education: Not on file   Highest education level: Professional school degree (e.g., MD, DDS, DVM, JD)  Occupational History   Occupation: pharmacist    Comment: retired 2016; now PRN    Employer: [employer]  Tobacco Use   Smoking status: Never   Smokeless tobacco: Never  Vaping Use   Vaping Use: Never used  Substance and Sexual Activity   Alcohol use: Yes    Alcohol/week: 6.0 standard drinks of alcohol    Types: 6 Glasses of wine per week    Comment: Occasional   Drug use: No   Sexual activity: Yes    Birth control/protection: Post-menopausal  Other Topics Concern   Not on file  Social History Narrative   Futures trader   Married   Exercises   Social Determinants of Health   Financial Resource Strain: Low Risk  (08/14/2022)   Overall Financial Resource Strain (CARDIA)    Difficulty of Paying Living Expenses: Not very hard  Food Insecurity: No Food Insecurity (08/14/2022)   Hunger Vital Sign    Worried About Running Out of Food in the Last Year: Never true    Ran Out of Food in the Last Year: Never true  Transportation Needs: No Transportation Needs (08/14/2022)   PRAPARE - Administrator, Civil Service (Medical): No    Lack of Transportation (Non-Medical): No  Physical Activity: Sufficiently Active (08/14/2022)   Exercise Vital Sign    Days of Exercise per Week: 3 days    Minutes of Exercise per Session: 50 min  Stress: No Stress Concern Present (08/14/2022)   [employer] of Occupational Health - Occupational Stress Questionnaire    Feeling of Stress : Not at all  Social Connections: Socially Integrated (08/14/2022)   Social Connection and Isolation Panel [NHANES]    Frequency of Communication with Friends and Family: More than three times a week    Frequency of Social Gatherings with Friends and Family: More than three times a week     Attends Religious Services: More than 4 times per year    Active Member of [employer] or Organizations: Yes    Attends Engineer, structural: More than 4 times per year    Marital Status: Married  Catering manager Violence: Not At Risk (03/14/2022)   Humiliation, Afraid, Rape, and Kick questionnaire    Fear of Current or Ex-Partner: No    Emotionally Abused: No    Physically Abused: No    Sexually Abused: No    Review of Systems  Constitutional:  Negative for fatigue and unexpected weight change.  Respiratory:  Negative for chest tightness and shortness of breath.   Cardiovascular:  Negative for chest pain, palpitations and leg swelling.  Gastrointestinal:  Negative for abdominal pain and blood in stool.  Neurological:  Negative for dizziness, syncope, light-headedness and headaches.     Objective:   Vitals:   08/15/22 0838  BP: 136/80  Pulse: 60  Resp: 18  Temp: 97.7 F (36.5 C)  TempSrc: Oral  SpO2: 96%  Weight: 182 lb 9.6 oz (82.8 kg)  Height: 5\' 4"  (1.626 m)     Physical Exam Vitals reviewed.  Constitutional:      Appearance: Normal appearance. She is well-developed.  HENT:     Head: Normocephalic and atraumatic.  Eyes:     Conjunctiva/sclera: Conjunctivae normal.     Pupils: Pupils are equal, round, and reactive to light.  Neck:     Vascular: No carotid bruit.  Cardiovascular:     Rate and Rhythm: Normal rate and regular rhythm.     Heart sounds: Normal heart sounds.  Pulmonary:     Effort: Pulmonary effort is normal.     Breath sounds: Normal breath sounds.  Abdominal:     Palpations: Abdomen is soft. There is no pulsatile mass.     Tenderness: There is no abdominal tenderness.  Musculoskeletal:     Right lower leg: No edema.     Left lower leg: No edema.  Skin:    General: Skin is warm and dry.  Neurological:     Mental Status: She is alert and oriented to person, place, and time.  Psychiatric:        Mood and Affect: Mood normal.         Behavior: Behavior normal.     Assessment & Plan:  SHEVAUN BACHMAN is a 77 y.o. female . Essential hypertension - Plan: hydrochlorothiazide (HYDRODIURIL) 25 MG tablet, metoprolol succinate (TOPROL-XL) 50 MG 24 hr tablet  - . Stable, tolerating current regimen. Medications refilled. Labs pending as above.   Hyperlipidemia, unspecified hyperlipidemia type - Plan: atorvastatin (LIPITOR) 10 MG tablet  -  Stable, tolerating current regimen. Medications refilled. Labs pending as above.   Colon cancer screening  -Phone number provided to call GI.  Vocal cord dysfunction Dysphonia  -Overall stable, okay to refill Ativan when due.  37-month follow-up  Health maintenance discussed including shingles vaccine at pharmacy, RSV vaccine with information given on after visit summary, link to CDC.  Flu and COVID vaccines in the fall.  Colonoscopy as above.  Meds ordered this encounter  Medications   atorvastatin (LIPITOR) 10 MG tablet    Sig: TAKE 1 TABLET(10 MG) BY MOUTH DAILY    Dispense:  90 tablet    Refill:  3   hydrochlorothiazide (HYDRODIURIL) 25 MG tablet    Sig: TAKE 1 TABLET(25 MG) BY MOUTH DAILY    Dispense:  90 tablet    Refill:  3   metoprolol succinate (TOPROL-XL) 50 MG 24 hr tablet    Sig: TAKE 1 TABLET(50 MG) BY MOUTH DAILY    Dispense:  90 tablet    Refill:  3   Patient Instructions  No med changes at this time.   It is important that you follow-up for repeat colonoscopy.  Gastroenterology has been trying to reach you.  Please call them to schedule appointment. [facility] [phone].   Shingles vaccine can be given at your pharmacy. Flu and covid vaccines in the fall.   There is a recommendation for RSV vaccine for patients over age 45.   Typically would recommend the RSV vaccine as most  important for patients over age 13 that have comorbidities that put them at increased risk for severe disease (heart disease such as congestive heart failure, coronary  artery disease, lung disease such as asthma or COPD, kidney disease, liver disease, diabetes, chronic or progressive neurologic or muscular conditions, immunosuppressed, or being frail or of advanced age).  For others that do not have these risk factors, there still is some benefit from vaccination since age is one of the main risk factors for developing severe disease however baseline risk of developing severe disease and requiring hospitalization is likely to be lower compared to those that have comorbidities in addition to age.   CDC does have some information as well: ToyProtection.fi       Signed,   Meredith Staggers, MD [facility], [facility] 08/15/22 8:54 AM

## 2022-08-15 NOTE — Patient Instructions (Addendum)
No med changes at this time.   It is important that you follow-up for repeat colonoscopy.  Gastroenterology has been trying to reach you.  Please call them to schedule appointment. [facility] [phone].   Shingles vaccine can be given at your pharmacy. Flu and covid vaccines in the fall.   There is a recommendation for RSV vaccine for patients over age 77.   Typically would recommend the RSV vaccine as most important for patients over age 59 that have comorbidities that put them at increased risk for severe disease (heart disease such as congestive heart failure, coronary artery disease, lung disease such as asthma or COPD, kidney disease, liver disease, diabetes, chronic or progressive neurologic or muscular conditions, immunosuppressed, or being frail or of advanced age).  For others that do not have these risk factors, there still is some benefit from vaccination since age is one of the main risk factors for developing severe disease however baseline risk of developing severe disease and requiring hospitalization is likely to be lower compared to those that have comorbidities in addition to age.   CDC does have some information as well: ToyProtection.fi

## 2023-02-13 ENCOUNTER — Encounter: Payer: Self-pay | Admitting: Family Medicine

## 2023-02-13 ENCOUNTER — Ambulatory Visit (INDEPENDENT_AMBULATORY_CARE_PROVIDER_SITE_OTHER): Payer: Medicare Other | Admitting: Family Medicine

## 2023-02-13 VITALS — BP 130/70 | HR 59 | Temp 98.0°F | Ht 64.0 in | Wt 175.2 lb

## 2023-02-13 DIAGNOSIS — E785 Hyperlipidemia, unspecified: Secondary | ICD-10-CM

## 2023-02-13 DIAGNOSIS — I10 Essential (primary) hypertension: Secondary | ICD-10-CM

## 2023-02-13 DIAGNOSIS — R490 Dysphonia: Secondary | ICD-10-CM

## 2023-02-13 DIAGNOSIS — Z1211 Encounter for screening for malignant neoplasm of colon: Secondary | ICD-10-CM

## 2023-02-13 DIAGNOSIS — J383 Other diseases of vocal cords: Secondary | ICD-10-CM | POA: Diagnosis not present

## 2023-02-13 LAB — LIPID PANEL
Cholesterol: 173 mg/dL (ref 0–200)
HDL: 51.9 mg/dL (ref >39.00)
LDL Cholesterol: 94 mg/dL (ref 0–99)
NonHDL: 121.54
Total CHOL/HDL Ratio: 3
Triglycerides: 139 mg/dL (ref 0.0–149.0)
VLDL: 27.8 mg/dL (ref 0.0–40.0)

## 2023-02-13 LAB — COMPREHENSIVE METABOLIC PANEL
ALT: 23 U/L (ref 0–35)
AST: 20 U/L (ref 0–37)
Albumin: 4.6 g/dL (ref 3.5–5.2)
Alkaline Phosphatase: 68 U/L (ref 39–117)
BUN: 16 mg/dL (ref 6–23)
CO2: 32 meq/L (ref 19–32)
Calcium: 10.5 mg/dL (ref 8.4–10.5)
Chloride: 99 meq/L (ref 96–112)
Creatinine, Ser: 0.87 mg/dL (ref 0.40–1.20)
GFR: 64.25 mL/min (ref >60.00)
Glucose, Bld: 104 mg/dL — ABNORMAL HIGH (ref 70–99)
Potassium: 3.5 meq/L (ref 3.5–5.1)
Sodium: 139 meq/L (ref 135–145)
Total Bilirubin: 0.7 mg/dL (ref 0.2–1.2)
Total Protein: 7.4 g/dL (ref 6.0–8.3)

## 2023-02-13 MED ORDER — LORAZEPAM 1 MG PO TABS: ORAL_TABLET | ORAL | 0 refills | Status: DC

## 2023-02-13 NOTE — Progress Notes (Signed)
Subjective:  Patient ID: Erin Mckee, female    DOB: 10/30/1945  Age: 77 y.o. MRN: [id number]  CC:  Chief Complaint  Patient presents with   Medical Management of Chronic Issues    Pt notes no questions     HPI EBUNOLUWA MOALA presents for   Hypertension: HCTZ 25 mg daily, Toprol-XL 50 mg daily. No new side effects.  Home readings: 130/80 or less.  BP Readings from Last 3 Encounters:  02/13/23 130/70  08/15/22 136/80  02/14/22 138/78   Lab Results  Component Value Date   CREATININE 0.90 08/15/2022   Hyperlipidemia: Lipitor 10 mg daily. No new myalgias/side effects.  Fasting today.  Lab Results  Component Value Date   CHOL 175 08/15/2022   HDL 55.50 08/15/2022   LDLCALC 85 08/15/2022   TRIG 176.0 (H) 08/15/2022   CHOLHDL 3 08/15/2022   Lab Results  Component Value Date   ALT 26 08/15/2022   AST 21 08/15/2022   ALKPHOS 77 08/15/2022   BILITOT 0.7 08/15/2022    Dysphonia vocal cord dysfunction Rare need for Ativan for flare of symptoms, typically when she has to work.  Controlled substance database reviewed, #60 lorazepam filled 06/15/2022.  Previously filled 02/14/2022. Some flare of dysphonia at times. Manageable with breathing exercises. Lorazepam helps when needed, 1/2 pill, no side effects. Needs RF.    Health maintenance: Due for colonoscopy, 2016.  Planned repeat 5 years.  GI had tried to contact patient earlier this year, unable to contact patient to schedule.  Difficulty with getting through on the phone at that time.  Phone number was provided and encouraged to schedule appointment at her May visit. Has not scheduled colonoscopy - trouble with wait time.   Shingles vaccine recommended again.  COVID-vaccine - declines.   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 also reports that she has seen Cardiology  at [facility] too.    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   Past Surgical History:  Procedure Laterality Date   CATARACT EXTRACTION Bilateral    june 2019 and january 2022   COLONOSCOPY     FRACTURE SURGERY     OPEN REDUCTION INTERNAL FIXATION (ORIF) DISTAL RADIAL FRACTURE  02/14/2012   Procedure: OPEN REDUCTION INTERNAL FIXATION (ORIF) DISTAL RADIAL FRACTURE;  Surgeon: Kathryne Hitch, MD;  Location: [facility] OR;  Service: Orthopedics;  Laterality: Right;  Open Reduction Internal Fixation Right Wrist   ORIF ANKLE FRACTURE Left 06/30/2019   Procedure: OPEN REDUCTION INTERNAL FIXATION (ORIF) LEFT ANKLE FRACTURE;  Surgeon: Cammy Copa, MD;  Location: [facility] OR;  Service: Orthopedics;  Laterality: Left;   TONSILLECTOMY     2009   No Known Allergies Prior to Admission medications   Medication Sig Start Date End Date  Taking? Authorizing Provider  amitriptyline (ELAVIL) 25 MG tablet Take by mouth. 12/15/11  Yes [provider]  atorvastatin (LIPITOR) 10 MG tablet TAKE 1 TABLET(10 MG) BY MOUTH DAILY 08/15/22  Yes Shade Flood, MD  hydrochlorothiazide (HYDRODIURIL) 25 MG tablet TAKE 1 TABLET(25 MG) BY MOUTH DAILY 08/15/22  Yes Shade Flood, MD  LORazepam (ATIVAN) 1 MG tablet TAKE 1/2 TO 1 TABLET(0.5 TO 1 MG) BY MOUTH TWICE DAILY AS NEEDED FOR THROAT SPASMS 06/14/22  Yes Shade Flood, MD  metoprolol succinate (TOPROL-XL) 50 MG 24 hr tablet TAKE 1 TABLET(50 MG) BY MOUTH  DAILY 08/15/22  Yes Shade Flood, MD   Social History   Socioeconomic History   Marital status: Married    Spouse name: Not on file   Number of children: 2   Years of education: Not on file   Highest education level: Professional school degree (e.g., MD, DDS, DVM, JD)  Occupational History   Occupation: pharmacist    Comment: retired 2016; now PRN    Employer: [employer]  Tobacco Use   Smoking status: Never   Smokeless tobacco: Never  Vaping Use   Vaping status: Never Used  Substance and Sexual Activity   Alcohol use: Yes    Alcohol/week: 6.0 standard drinks of alcohol    Types: 6 Glasses of wine per week    Comment: Occasional   Drug use: No   Sexual activity: Yes    Birth control/protection: Post-menopausal  Other Topics Concern   Not on file  Social History Narrative   Futures trader   Married   Exercises   Social Determinants of Health   Financial Resource Strain: Low Risk  (08/14/2022)   Overall Financial Resource Strain (CARDIA)    Difficulty of Paying Living Expenses: Not very hard  Food Insecurity: No Food Insecurity (08/14/2022)   Hunger Vital Sign    Worried About Running Out of Food in the Last Year: Never true    Ran Out of Food in the Last Year: Never true  Transportation Needs: No Transportation Needs (08/14/2022)   PRAPARE - Administrator, Civil Service (Medical): No    Lack of Transportation (Non-Medical): No  Physical Activity: Sufficiently Active (08/14/2022)   Exercise Vital Sign    Days of Exercise per Week: 3 days    Minutes of Exercise per Session: 50 min  Stress: No Stress Concern Present (08/14/2022)   [employer] of Occupational Health - Occupational Stress Questionnaire    Feeling of Stress : Not at all  Social Connections: Socially Integrated (08/14/2022)   Social Connection and Isolation Panel [NHANES]    Frequency of Communication with Friends and Family: More than three times a week    Frequency of Social  Gatherings with Friends and Family: More than three times a week    Attends Religious Services: More than 4 times per year    Active Member of [employer] or Organizations: Yes    Attends Engineer, structural: More than 4 times per year    Marital Status: Married  Catering manager Violence: Not At Risk (03/14/2022)   Humiliation, Afraid, Rape, and Kick questionnaire    Fear of Current or Ex-Partner: No    Emotionally Abused: No    Physically Abused: No    Sexually Abused: No    Review of Systems  Constitutional:  Negative for fatigue and unexpected weight change.  Respiratory:  Negative for chest tightness and shortness of breath.  Cardiovascular:  Negative for chest pain, palpitations and leg swelling.  Gastrointestinal:  Negative for abdominal pain and blood in stool.  Neurological:  Negative for dizziness, syncope, light-headedness and headaches.     Objective:   Vitals:   02/13/23 0825  BP: 130/70  Pulse: (!) 59  Temp: 98 F (36.7 C)  TempSrc: Temporal  SpO2: 96%  Weight: 175 lb 3.2 oz (79.5 kg)  Height: 5\' 4"  (1.626 m)     Physical Exam Vitals reviewed.  Constitutional:      Appearance: Normal appearance. She is well-developed.  HENT:     Head: Normocephalic and atraumatic.  Eyes:     Conjunctiva/sclera: Conjunctivae normal.     Pupils: Pupils are equal, round, and reactive to light.  Neck:     Vascular: No carotid bruit.     Trachea: No tracheal deviation.     Comments: No stridor Cardiovascular:     Rate and Rhythm: Normal rate and regular rhythm.     Heart sounds: Normal heart sounds.  Pulmonary:     Effort: Pulmonary effort is normal.     Breath sounds: Normal breath sounds.  Abdominal:     Palpations: Abdomen is soft. There is no pulsatile mass.     Tenderness: There is no abdominal tenderness.  Musculoskeletal:     Right lower leg: No edema.     Left lower leg: No edema.  Skin:    General: Skin is warm and dry.  Neurological:     Mental  Status: She is alert and oriented to person, place, and time.  Psychiatric:        Mood and Affect: Mood normal.        Behavior: Behavior normal.        Assessment & Plan:  MILLENNIA DABU is a 77 y.o. female . Hyperlipidemia, unspecified hyperlipidemia type - Plan: Comprehensive metabolic panel, Lipid panel  -Tolerating current regimen, check labs and adjust plan accordingly.  No changes for now.  Vocal cord dysfunction - Plan: LORazepam (ATIVAN) 1 MG tablet Dysphonia - Plan: LORazepam (ATIVAN) 1 MG tablet  -Intermittent flares, overall stable with breathing techniques, rare need for lorazepam without new side effects, lowest effective dose discussed.  Refilled.  Hypertension Borderline but improved from previous readings.  Continue same regimen for now with recheck in 6 months.  RTC precautions.  Check labs above and adjust plan accordingly.  Screen for colon cancer - Plan: Ambulatory referral to Gastroenterology  -Stressed importance of repeat colonoscopy.  Phone number provided but repeat referral ordered in case previous referral has expired.  Meds ordered this encounter  Medications   LORazepam (ATIVAN) 1 MG tablet    Sig: TAKE 1/2 TO 1 TABLET(0.5 TO 1 MG) BY MOUTH TWICE DAILY AS NEEDED FOR THROAT SPASMS    Dispense:  60 tablet    Refill:  0    **Patient requests 90 days supply**   Patient Instructions  Please call and schedule colonoscopy: [facility] [phone]. I did place a new referral if needed.    No med changes at this time. Take care!        Signed,   Meredith Staggers, MD [facility], [facility] 02/13/23 8:59 AM

## 2023-02-13 NOTE — Patient Instructions (Addendum)
Please call and schedule colonoscopy: [facility] [phone]. I did place a new referral if needed.    No med changes at this time. Take care!

## 2023-03-19 ENCOUNTER — Ambulatory Visit (INDEPENDENT_AMBULATORY_CARE_PROVIDER_SITE_OTHER): Payer: Medicare Other | Admitting: *Deleted

## 2023-03-19 DIAGNOSIS — Z0000 Encounter for general adult medical examination without abnormal findings: Secondary | ICD-10-CM | POA: Diagnosis not present

## 2023-03-19 NOTE — Patient Instructions (Signed)
[name] , Thank you for taking time to come for your Medicare Wellness Visit. I appreciate your ongoing commitment to your health goals. Please review the following plan we discussed and let me know if I can assist you in the future.   Screening recommendations/referrals: Colonoscopy: Education provided Mammogram: Education provided Bone Density: up to date Recommended yearly ophthalmology/optometry visit for glaucoma screening and checkup Recommended yearly dental visit for hygiene and checkup  Vaccinations: Influenza vaccine: up to date Pneumococcal vaccine: up to date Tdap vaccine: up to date Shingles vaccine: education provided  Advanced directives: Education provided     Preventive Care 65 Years and Older, Female Preventive care refers to lifestyle choices and visits with your health care provider that can promote health and wellness. What does preventive care include? A yearly physical exam. This is also called an annual well check. Dental exams once or twice a year. Routine eye exams. Ask your health care provider how often you should have your eyes checked. Personal lifestyle choices, including: Daily care of your teeth and gums. Regular physical activity. Eating a healthy diet. Avoiding tobacco and drug use. Limiting alcohol use. Practicing safe sex. Taking low-dose aspirin every day. Taking vitamin and mineral supplements as recommended by your health care provider. What happens during an annual well check? The services and screenings done by your health care provider during your annual well check will depend on your age, overall health, lifestyle risk factors, and family history of disease. Counseling  Your health care provider may ask you questions about your: Alcohol use. Tobacco use. Drug use. Emotional well-being. Home and relationship well-being. Sexual activity. Eating habits. History of falls. Memory and ability to understand (cognition). Work and  work Astronomer. Reproductive health. Screening  You may have the following tests or measurements: Height, weight, and BMI. Blood pressure. Lipid and cholesterol levels. These may be checked every 5 years, or more frequently if you are over 39 years old. Skin check. Lung cancer screening. You may have this screening every year starting at age 67 if you have a 30-pack-year history of smoking and currently smoke or have quit within the past 15 years. Fecal occult blood test (FOBT) of the stool. You may have this test every year starting at age 49. Flexible sigmoidoscopy or colonoscopy. You may have a sigmoidoscopy every 5 years or a colonoscopy every 10 years starting at age 35. Hepatitis C blood test. Hepatitis B blood test. Sexually transmitted disease (STD) testing. Diabetes screening. This is done by checking your blood sugar (glucose) after you have not eaten for a while (fasting). You may have this done every 1-3 years. Bone density scan. This is done to screen for osteoporosis. You may have this done starting at age 62. Mammogram. This may be done every 1-2 years. Talk to your health care provider about how often you should have regular mammograms. Talk with your health care provider about your test results, treatment options, and if necessary, the need for more tests. Vaccines  Your health care provider may recommend certain vaccines, such as: Influenza vaccine. This is recommended every year. Tetanus, diphtheria, and acellular pertussis (Tdap, Td) vaccine. You may need a Td booster every 10 years. Zoster vaccine. You may need this after age 44. Pneumococcal 13-valent conjugate (PCV13) vaccine. One dose is recommended after age 30. Pneumococcal polysaccharide (PPSV23) vaccine. One dose is recommended after age 18. Talk to your health care provider about which screenings and vaccines you need and how often you need them.  This information is not intended to replace advice given to you  by your health care provider. Make sure you discuss any questions you have with your health care provider. Document Released: 04/15/2015 Document Revised: 12/07/2015 Document Reviewed: 01/18/2015 Elsevier Interactive Patient Education  2017 [employer].  Fall Prevention in the Home Falls can cause injuries. They can happen to people of all ages. There are many things you can do to make your home safe and to help prevent falls. What can I do on the outside of my home? Regularly fix the edges of walkways and driveways and fix any cracks. Remove anything that might make you trip as you walk through a door, such as a raised step or threshold. Trim any bushes or trees on the path to your home. Use bright outdoor lighting. Clear any walking paths of anything that might make someone trip, such as rocks or tools. Regularly check to see if handrails are loose or broken. Make sure that both sides of any steps have handrails. Any raised decks and porches should have guardrails on the edges. Have any leaves, snow, or ice cleared regularly. Use sand or salt on walking paths during winter. Clean up any spills in your garage right away. This includes oil or grease spills. What can I do in the bathroom? Use night lights. Install grab bars by the toilet and in the tub and shower. Do not use towel bars as grab bars. Use non-skid mats or decals in the tub or shower. If you need to sit down in the shower, use a plastic, non-slip stool. Keep the floor dry. Clean up any water that spills on the floor as soon as it happens. Remove soap buildup in the tub or shower regularly. Attach bath mats securely with double-sided non-slip rug tape. Do not have throw rugs and other things on the floor that can make you trip. What can I do in the bedroom? Use night lights. Make sure that you have a light by your bed that is easy to reach. Do not use any sheets or blankets that are too big for your bed. They should not  hang down onto the floor. Have a firm chair that has side arms. You can use this for support while you get dressed. Do not have throw rugs and other things on the floor that can make you trip. What can I do in the kitchen? Clean up any spills right away. Avoid walking on wet floors. Keep items that you use a lot in easy-to-reach places. If you need to reach something above you, use a strong step stool that has a grab bar. Keep electrical cords out of the way. Do not use floor polish or wax that makes floors slippery. If you must use wax, use non-skid floor wax. Do not have throw rugs and other things on the floor that can make you trip. What can I do with my stairs? Do not leave any items on the stairs. Make sure that there are handrails on both sides of the stairs and use them. Fix handrails that are broken or loose. Make sure that handrails are as long as the stairways. Check any carpeting to make sure that it is firmly attached to the stairs. Fix any carpet that is loose or worn. Avoid having throw rugs at the top or bottom of the stairs. If you do have throw rugs, attach them to the floor with carpet tape. Make sure that you have a light switch at the  top of the stairs and the bottom of the stairs. If you do not have them, ask someone to add them for you. What else can I do to help prevent falls? Wear shoes that: Do not have high heels. Have rubber bottoms. Are comfortable and fit you well. Are closed at the toe. Do not wear sandals. If you use a stepladder: Make sure that it is fully opened. Do not climb a closed stepladder. Make sure that both sides of the stepladder are locked into place. Ask someone to hold it for you, if possible. Clearly mark and make sure that you can see: Any grab bars or handrails. First and last steps. Where the edge of each step is. Use tools that help you move around (mobility aids) if they are needed. These  include: Canes. Walkers. Scooters. Crutches. Turn on the lights when you go into a dark area. Replace any light bulbs as soon as they burn out. Set up your furniture so you have a clear path. Avoid moving your furniture around. If any of your floors are uneven, fix them. If there are any pets around you, be aware of where they are. Review your medicines with your doctor. Some medicines can make you feel dizzy. This can increase your chance of falling. Ask your doctor what other things that you can do to help prevent falls. This information is not intended to replace advice given to you by your health care provider. Make sure you discuss any questions you have with your health care provider. Document Released: 01/13/2009 Document Revised: 08/25/2015 Document Reviewed: 04/23/2014 Elsevier Interactive Patient Education  2017 [employer].

## 2023-03-19 NOTE — Progress Notes (Signed)
Subjective:   Erin Mckee is a 77 y.o. female who presents for Medicare Annual (Subsequent) preventive examination.  Visit Complete: Virtual I connected with  Erin Mckee on 03/19/23 by a audio enabled telemedicine application and verified that I am speaking with the correct person using two identifiers.  Patient Location: Home  Provider Location: Home Office  I discussed the limitations of evaluation and management by telemedicine. The patient expressed understanding and agreed to proceed.  Vital Signs: Because this visit was a virtual/telehealth visit, some criteria may be missing or patient reported. Any vitals not documented were not able to be obtained and vitals that have been documented are patient reported.  Cardiac Risk Factors include: advanced age (>38men, >65 women);hypertension     Objective:    There were no vitals filed for this visit. There is no height or weight on file to calculate BMI.     03/19/2023   10:11 AM 08/23/2021    8:11 AM 02/16/2021    3:08 PM 07/07/2019   10:06 AM 06/30/2019    2:35 PM 04/29/2018    8:22 AM 04/26/2017    8:43 AM  Advanced Directives  Does Patient Have a Medical Advance Directive? No No No No No Yes No  Type of Advance Directive      Living will   Does patient want to make changes to medical advance directive?      No - Patient declined   Would patient like information on creating a medical advance directive? No - Patient declined No - Patient declined No - Patient declined Yes (ED - Information included in AVS) No - Patient declined  Yes (MAU/Ambulatory/Procedural Areas - Information given)    Current Medications (verified) Outpatient Encounter Medications as of 03/19/2023  Medication Sig   amitriptyline (ELAVIL) 25 MG tablet Take by mouth.   atorvastatin (LIPITOR) 10 MG tablet TAKE 1 TABLET(10 MG) BY MOUTH DAILY   hydrochlorothiazide (HYDRODIURIL) 25 MG tablet TAKE 1 TABLET(25 MG) BY MOUTH DAILY   LORazepam (ATIVAN) 1  MG tablet TAKE 1/2 TO 1 TABLET(0.5 TO 1 MG) BY MOUTH TWICE DAILY AS NEEDED FOR THROAT SPASMS   metoprolol succinate (TOPROL-XL) 50 MG 24 hr tablet TAKE 1 TABLET(50 MG) BY MOUTH DAILY   No facility-administered encounter medications on file as of 03/19/2023.    Allergies (verified) Patient has no known allergies.   History: Past Medical History:  Diagnosis Date   GERD (gastroesophageal reflux disease)    spasmodic dysphonia   Heart murmur    resolved- related to stress of throat closing , echo  done 2009   Hepatitis    hep- a as a child   Hyperlipidemia    Hypertension    PCP manages htn, pt. also reports that she has seen Cardiology  at [facility] too.    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   Past Surgical History:  Procedure Laterality Date   CATARACT EXTRACTION Bilateral    june 2019 and january 2022   COLONOSCOPY     FRACTURE SURGERY     OPEN REDUCTION INTERNAL FIXATION (ORIF) DISTAL RADIAL FRACTURE  02/14/2012   Procedure: OPEN REDUCTION INTERNAL FIXATION (ORIF) DISTAL RADIAL FRACTURE;  Surgeon: Kathryne Hitch, MD;  Location: [facility] OR;  Service: Orthopedics;  Laterality: Right;  Open Reduction Internal Fixation Right Wrist   ORIF ANKLE FRACTURE Left 06/30/2019   Procedure: OPEN REDUCTION INTERNAL FIXATION (ORIF)  LEFT ANKLE FRACTURE;  Surgeon: Cammy Copa, MD;  Location: [facility] OR;  Service: Orthopedics;  Laterality: Left;   TONSILLECTOMY     2009   Family History  Problem Relation Age of Onset   Hypertension Mother    Heart disease Mother 1       CAD/CABG   Hyperlipidemia Mother    Hypertension Father    Heart disease Father        AMI   Hyperlipidemia Father    Multiple myeloma Father    Hypertension Sister    Thyroid disease Sister    Hypertension Brother    Crohn's disease Brother    Cerebral palsy Son    Hydrocephalus Son    Colon cancer Neg Hx    Social History    Socioeconomic History   Marital status: Married    Spouse name: Not on file   Number of children: 2   Years of education: Not on file   Highest education level: Professional school degree (e.g., MD, DDS, DVM, JD)  Occupational History   Occupation: pharmacist    Comment: retired 2016; now PRN    Employer: [employer]  Tobacco Use   Smoking status: Never   Smokeless tobacco: Never  Vaping Use   Vaping status: Never Used  Substance and Sexual Activity   Alcohol use: Yes    Alcohol/week: 6.0 standard drinks of alcohol    Types: 6 Glasses of wine per week    Comment: Occasional   Drug use: No   Sexual activity: Yes    Birth control/protection: Post-menopausal  Other Topics Concern   Not on file  Social History Narrative   Futures trader   Married   Exercises   Social Drivers of Health   Financial Resource Strain: Low Risk  (03/19/2023)   Overall Financial Resource Strain (CARDIA)    Difficulty of Paying Living Expenses: Not hard at all  Food Insecurity: No Food Insecurity (03/19/2023)   Hunger Vital Sign    Worried About Running Out of Food in the Last Year: Never true    Ran Out of Food in the Last Year: Never true  Transportation Needs: No Transportation Needs (03/19/2023)   PRAPARE - Administrator, Civil Service (Medical): No    Lack of Transportation (Non-Medical): No  Physical Activity: Sufficiently Active (03/19/2023)   Exercise Vital Sign    Days of Exercise per Week: 4 days    Minutes of Exercise per Session: 50 min  Stress: No Stress Concern Present (03/19/2023)   [employer] of Occupational Health - Occupational Stress Questionnaire    Feeling of Stress : Not at all  Social Connections: Socially Integrated (03/19/2023)   Social Connection and Isolation Panel [NHANES]    Frequency of Communication with Friends and Family: More than three times a week    Frequency of Social Gatherings with Friends and Family: More than three times  a week    Attends Religious Services: More than 4 times per year    Active Member of [employer] or Organizations: Yes    Attends Engineer, structural: More than 4 times per year    Marital Status: Married    Tobacco Counseling Counseling given: Not Answered   Clinical Intake:  Pre-visit preparation completed: Yes  Pain : No/denies pain     Diabetes: No  How often do you need to have someone help you when you read instructions, pamphlets, or other written materials from your doctor or  pharmacy?: 1 - Never  Interpreter Needed?: No  Information entered by :: Remi Haggard LPN   Activities of Daily Living    03/19/2023   10:12 AM  In your present state of health, do you have any difficulty performing the following activities:  Hearing? 0  Vision? 0  Difficulty concentrating or making decisions? 0  Walking or climbing stairs? 0  Dressing or bathing? 0  Doing errands, shopping? 0  Preparing Food and eating ? N  Using the Toilet? N  In the past six months, have you accidently leaked urine? N  Do you have problems with loss of bowel control? N  Managing your Medications? N  Managing your Finances? N  Housekeeping or managing your Housekeeping? N    Patient Care Team: Shade Flood, MD as PCP - General (Family Medicine) [facility]  Indicate any recent Medical Services you may have received from other than [facility] providers in the past year (date may be approximate).     Assessment:   This is a routine wellness examination for [place].  Hearing/Vision screen Hearing Screening - Comments:: No hearing aids Vision Screening - Comments:: Up to date [place]   Goals Addressed             This Visit's Progress    Patient Stated       Continue current lifestyle        Depression Screen    03/19/2023   10:09 AM 02/13/2023    8:24 AM 08/15/2022    8:43 AM 03/14/2022   12:11 PM 02/14/2022    8:08 AM 08/23/2021    8:07 AM  02/16/2021    3:09 PM  PHQ 2/9 Scores  PHQ - 2 Score 0 0 0 0 0 0 0  PHQ- 9 Score 0 0 0 0 0 1     Fall Risk    03/19/2023   10:20 AM 02/13/2023    8:24 AM 08/15/2022    8:44 AM 03/14/2022   12:05 PM 02/14/2022    8:08 AM  Fall Risk   Falls in the past year? 0 0 0 0 0  Number falls in past yr: 0 0 0 0 0  Injury with Fall? 0 0 0 0 0  Risk for fall due to :  No Fall Risks   No Fall Risks  Follow up Falls evaluation completed;Education provided;Falls prevention discussed Falls evaluation completed Falls evaluation completed Falls evaluation completed;Education provided;Falls prevention discussed Falls evaluation completed    MEDICARE RISK AT HOME: Medicare Risk at Home Any stairs in or around the home?: No If so, are there any without handrails?: No Home free of loose throw rugs in walkways, pet beds, electrical cords, etc?: Yes Adequate lighting in your home to reduce risk of falls?: Yes Life alert?: No Use of a cane, walker or w/c?: No Grab bars in the bathroom?: Yes Shower chair or bench in shower?: Yes Elevated toilet seat or a handicapped toilet?: Yes  TIMED UP AND GO:  Was the test performed?  No    Cognitive Function:        03/14/2022   12:08 PM 08/23/2021    8:08 AM 07/07/2019   10:06 AM 04/29/2018    8:27 AM 04/26/2017    8:50 AM  6CIT Screen  What Year? 0 points 0 points 0 points 0 points 0 points  What month? 0 points 0 points 0 points 0 points 0 points  What time? 0 points 0 points 0  points 0 points 0 points  Count back from 20 0 points 0 points 2 points 0 points 0 points  Months in reverse 0 points 0 points 0 points 0 points 0 points  Repeat phrase 0 points 0 points 0 points 0 points 0 points  Total Score 0 points 0 points 2 points 0 points 0 points    Immunizations Immunization History  Administered Date(s) Administered   Fluad Quad(high Dose 65+) 01/17/2022   Hepatitis B 04/14/1997   Influenza Split 01/17/2012, 12/15/2013   Influenza-Unspecified  12/14/2022   PFIZER(Purple Top)SARS-COV-2 Vaccination 04/15/2019, 05/06/2019   Pneumococcal Conjugate-13 02/22/2014   Pneumococcal Polysaccharide-23 04/14/2012   Td 09/09/2018   Tdap 04/02/2008   Zoster, Live 02/01/2015    TDAP status: Up to date  Flu Vaccine status: Up to date  Pneumococcal vaccine status: Up to date  Covid-19 vaccine status: Information provided on how to obtain vaccines.   Qualifies for Shingles Vaccine? Yes   Zostavax completed No   Shingrix Completed?: No.    Education has been provided regarding the importance of this vaccine. Patient has been advised to call insurance company to determine out of pocket expense if they have not yet received this vaccine. Advised may also receive vaccine at local pharmacy or Health Dept. Verbalized acceptance and understanding.  Screening Tests Health Maintenance  Topic Date Due   COVID-19 Vaccine (3 - 2024-25 season) 04/04/2023 (Originally 12/02/2022)   Zoster Vaccines- Shingrix (1 of 2) 06/17/2023 (Originally 09/02/1995)   Colonoscopy  03/18/2024 (Originally 10/26/2019)   Medicare Annual Wellness (AWV)  03/18/2024   DTaP/Tdap/Td (3 - Td or Tdap) 09/08/2028   Pneumonia Vaccine 22+ Years old  Completed   INFLUENZA VACCINE  Completed   DEXA SCAN  Completed   Hepatitis C Screening  Completed   HPV VACCINES  Aged Out    Health Maintenance  There are no preventive care reminders to display for this patient.   Colorectal cancer screening: No longer required.   Mammogram  Education provided  Bone Density status: Completed 2019. Results reflect: Bone density results: NORMAL. Repeat every 0 years.  Lung Cancer Screening: (Low Dose CT Chest recommended if Age 51-80 years, 20 pack-year currently smoking OR have quit w/in 15years.) does not qualify.   Lung Cancer Screening Referral:   Additional Screening:  Hepatitis C Screening: does not qualify; Completed 2018  Vision Screening: Recommended annual ophthalmology exams for  early detection of glaucoma and other disorders of the eye. Is the patient up to date with their annual eye exam?  Yes  Who is the provider or what is the name of the office in which the patient attends annual eye exams? [facility] If pt is not established with a provider, would they like to be referred to a provider to establish care? No .   Dental Screening: Recommended annual dental exams for proper oral hygiene    Community Resource Referral / Chronic Care Management: CRR required this visit?  No   CCM required this visit?  No     Plan:     I have personally reviewed and noted the following in the patient's chart:   Medical and social history Use of alcohol, tobacco or illicit drugs  Current medications and supplements including opioid prescriptions. Patient is not currently taking opioid prescriptions. Functional ability and status Nutritional status Physical activity Advanced directives List of other physicians Hospitalizations, surgeries, and ER visits in previous 12 months Vitals Screenings to include cognitive, depression, and falls Referrals and appointments  In addition, I have reviewed and discussed with patient certain preventive protocols, quality metrics, and best practice recommendations. A written personalized care plan for preventive services as well as general preventive health recommendations were provided to patient.     Remi Haggard, LPN   16/04/930   After Visit Summary: (MyChart) Due to this being a telephonic visit, the after visit summary with patients personalized plan was offered to patient via MyChart   Nurse Notes:

## 2023-07-03 DIAGNOSIS — H26492 Other secondary cataract, left eye: Secondary | ICD-10-CM | POA: Diagnosis not present

## 2023-07-03 DIAGNOSIS — Z961 Presence of intraocular lens: Secondary | ICD-10-CM | POA: Diagnosis not present

## 2023-07-29 ENCOUNTER — Ambulatory Visit (AMBULATORY_SURGERY_CENTER)

## 2023-07-29 VITALS — Ht 64.0 in | Wt 165.0 lb

## 2023-07-29 DIAGNOSIS — Z860100 Personal history of colon polyps, unspecified: Secondary | ICD-10-CM

## 2023-07-29 MED ORDER — NA SULFATE-K SULFATE-MG SULF 17.5-3.13-1.6 GM/177ML PO SOLN: 1.0000 | Freq: Once | ORAL | 0 refills | Status: AC

## 2023-07-29 NOTE — Progress Notes (Signed)

## 2023-07-31 DIAGNOSIS — H26492 Other secondary cataract, left eye: Secondary | ICD-10-CM | POA: Diagnosis not present

## 2023-08-08 ENCOUNTER — Ambulatory Visit (INDEPENDENT_AMBULATORY_CARE_PROVIDER_SITE_OTHER): Admitting: Family Medicine

## 2023-08-08 ENCOUNTER — Encounter: Payer: Self-pay | Admitting: Family Medicine

## 2023-08-08 DIAGNOSIS — R490 Dysphonia: Secondary | ICD-10-CM | POA: Diagnosis not present

## 2023-08-08 DIAGNOSIS — I10 Essential (primary) hypertension: Secondary | ICD-10-CM | POA: Diagnosis not present

## 2023-08-08 DIAGNOSIS — J383 Other diseases of vocal cords: Secondary | ICD-10-CM | POA: Diagnosis not present

## 2023-08-08 DIAGNOSIS — E785 Hyperlipidemia, unspecified: Secondary | ICD-10-CM | POA: Diagnosis not present

## 2023-08-08 LAB — LIPID PANEL
Cholesterol: 153 mg/dL (ref 0–200)
HDL: 55.5 mg/dL (ref >39.00)
LDL Cholesterol: 75 mg/dL (ref 0–99)
NonHDL: 97.52
Total CHOL/HDL Ratio: 3
Triglycerides: 115 mg/dL (ref 0.0–149.0)
VLDL: 23 mg/dL (ref 0.0–40.0)

## 2023-08-08 LAB — COMPREHENSIVE METABOLIC PANEL WITH GFR
ALT: 18 U/L (ref 0–35)
AST: 18 U/L (ref 0–37)
Albumin: 4.6 g/dL (ref 3.5–5.2)
Alkaline Phosphatase: 68 U/L (ref 39–117)
BUN: 17 mg/dL (ref 6–23)
CO2: 30 meq/L (ref 19–32)
Calcium: 10.1 mg/dL (ref 8.4–10.5)
Chloride: 101 meq/L (ref 96–112)
Creatinine, Ser: 0.78 mg/dL (ref 0.40–1.20)
GFR: 73 mL/min (ref >60.00)
Glucose, Bld: 98 mg/dL (ref 70–99)
Potassium: 3.8 meq/L (ref 3.5–5.1)
Sodium: 138 meq/L (ref 135–145)
Total Bilirubin: 0.5 mg/dL (ref 0.2–1.2)
Total Protein: 7.2 g/dL (ref 6.0–8.3)

## 2023-08-08 MED ORDER — LORAZEPAM 1 MG PO TABS: ORAL_TABLET | ORAL | 0 refills | Status: DC

## 2023-08-08 MED ORDER — HYDROCHLOROTHIAZIDE 25 MG PO TABS
ORAL_TABLET | ORAL | 3 refills | Status: DC
Start: 2023-08-08 — End: 2024-02-12

## 2023-08-08 MED ORDER — METOPROLOL SUCCINATE ER 50 MG PO TB24: ORAL_TABLET | ORAL | 3 refills | Status: DC

## 2023-08-08 MED ORDER — ATORVASTATIN CALCIUM 10 MG PO TABS: ORAL_TABLET | ORAL | 3 refills | Status: DC

## 2023-08-08 NOTE — Progress Notes (Signed)
 Subjective:  Patient ID: Erin Mckee, female    DOB: 05/01/1945  Age: 78 y.o. MRN: [id number]  CC:  Chief Complaint  Patient presents with   Medical Management of Chronic Issues    Pt is well, no concerns, notes she is fasting     HPI Erin Mckee presents for follow up. No health changes.  Still camping and horseback riding.   Hypertension: HCTZ 25 mg daily, Toprol -XL 50 mg daily without any side effects.  Home blood pressures have been typically lower than in office, under 130/80 in past. Salt seems to affect BP.  Home readings: 113-150/70's - rare 80.  BP Readings from Last 3 Encounters:  08/08/23 138/72  02/13/23 130/70  08/15/22 136/80   Lab Results  Component Value Date   CREATININE 0.87 02/13/2023   Hyperlipidemia: Treated with Lipitor 10 mg daily without new myalgias/side effects.   Lab Results  Component Value Date   CHOL 173 02/13/2023   HDL 51.90 02/13/2023   LDLCALC 94 02/13/2023   TRIG 139.0 02/13/2023   CHOLHDL 3 02/13/2023   Lab Results  Component Value Date   ALT 23 02/13/2023   AST 20 02/13/2023   ALKPHOS 68 02/13/2023   BILITOT 0.7 02/13/2023   Dysphonia with vocal cord dysfunction Has been managed previously with rare Ativan  for flare symptoms, typically at work.  Typically managed with breathing exercises.  Half pill lorazepam  is usually effective.  Controlled substance database reviewed.  Lorazepam  1 mg tablet #60 on 02/13/2023, previously 06/15/2022. Still working well, still using breathing exercises. Has noticed it getting triggered more often - ordering at drive through, on telephone.  No recent specialty follow up. Declines botox which was recommendation prior, not a good experience prior.  No falls, or side effects with lorazepam . Needs refill.   Health maintenance: Colonoscopy May 21st.   Declines covid and shingrix vaccines.    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 also reports that she has seen Cardiology  at [facility] too.    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   Past Surgical History:  Procedure Laterality Date   CATARACT EXTRACTION Bilateral    june 2019 and january 2022   COLONOSCOPY     FRACTURE SURGERY     OPEN REDUCTION INTERNAL FIXATION (ORIF) DISTAL RADIAL FRACTURE  02/14/2012   Procedure: OPEN REDUCTION INTERNAL FIXATION (ORIF) DISTAL RADIAL FRACTURE;  Surgeon: Arnie Lao, MD;  Location: [facility] OR;  Service: Orthopedics;  Laterality: Right;  Open Reduction Internal Fixation Right Wrist   ORIF ANKLE FRACTURE Left 06/30/2019   Procedure: OPEN REDUCTION INTERNAL FIXATION (ORIF) LEFT ANKLE FRACTURE;  Surgeon: Jasmine Mesi, MD;  Location: [facility] OR;  Service: Orthopedics;  Laterality: Left;   TONSILLECTOMY  2009   No Known Allergies Prior to Admission medications   Medication Sig Start Date End Date Taking? Authorizing Provider  atorvastatin  (LIPITOR) 10 MG tablet TAKE 1 TABLET(10 MG) BY MOUTH DAILY 08/15/22  Yes Benjiman Bras, MD  hydrochlorothiazide  (HYDRODIURIL ) 25 MG tablet TAKE 1 TABLET(25 MG) BY MOUTH DAILY 08/15/22  Yes Benjiman Bras, MD  LORazepam  (ATIVAN ) 1 MG tablet TAKE 1/2 TO 1 TABLET(0.5 TO 1 MG) BY MOUTH TWICE DAILY AS NEEDED FOR THROAT SPASMS 02/13/23  Yes Benjiman Bras, MD  metoprolol  succinate  (TOPROL -XL) 50 MG 24 hr tablet TAKE 1 TABLET(50 MG) BY MOUTH DAILY 08/15/22  Yes Benjiman Bras, MD   Social History   Socioeconomic History   Marital status: Married    Spouse name: Not on file   Number of children: 2   Years of education: Not on file   Highest education level: Professional school degree (e.g., MD, DDS, DVM, JD)  Occupational History   Occupation: pharmacist    Comment: retired 2016; now PRN    Employer: [employer]  Tobacco Use   Smoking status: Never   Smokeless tobacco: Never  Vaping Use   Vaping status: Never Used  Substance and Sexual Activity   Alcohol use: Yes    Alcohol/week: 6.0 standard drinks of alcohol    Types: 6 Glasses of wine per week    Comment: Occasional   Drug use: No   Sexual activity: Yes    Birth control/protection: Post-menopausal  Other Topics Concern   Not on file  Social History Narrative   Futures trader   Married   Exercises   Social Drivers of Health   Financial Resource Strain: Low Risk  (03/19/2023)   Overall Financial Resource Strain (CARDIA)    Difficulty of Paying Living Expenses: Not hard at all  Food Insecurity: No Food Insecurity (03/19/2023)   Hunger Vital Sign    Worried About Running Out of Food in the Last Year: Never true    Ran Out of Food in the Last Year: Never true  Transportation Needs: No Transportation Needs (03/19/2023)   PRAPARE - Administrator, Civil Service (Medical): No    Lack of Transportation (Non-Medical): No  Physical Activity: Sufficiently Active (03/19/2023)   Exercise Vital Sign    Days of Exercise per Week: 4 days    Minutes of Exercise per Session: 50 min  Stress: No Stress Concern Present (03/19/2023)   [employer] of Occupational Health - Occupational Stress Questionnaire    Feeling of Stress : Not at all  Social Connections: Socially Integrated (03/19/2023)   Social Connection and Isolation Panel [NHANES]    Frequency of Communication with Friends  and Family: More than three times a week    Frequency of Social Gatherings with Friends and Family: More than three times a week    Attends Religious Services: More than 4 times per year    Active Member of [employer] or Organizations: Yes    Attends Banker Meetings: More than 4 times per year    Marital Status: Married  Catering manager Violence: Not At Risk (03/19/2023)   Humiliation, Afraid, Rape, and Kick questionnaire    Fear of Current or Ex-Partner: No    Emotionally Abused: No    Physically Abused: No    Sexually Abused: No    Review of Systems Per HPI  Objective:   Vitals:   08/08/23 0856  BP: 138/72  Pulse: (!) 59  Temp: 98.3 F (36.8 C)  TempSrc: Temporal  SpO2: 96%  Weight: 174 lb (78.9 kg)  Height: 5\' 4"  (1.626 m)     Physical Exam Vitals reviewed.  Constitutional:      Appearance: Normal appearance. She is well-developed.  HENT:     Head: Normocephalic and atraumatic.  Eyes:     Conjunctiva/sclera: Conjunctivae normal.     Pupils: Pupils are equal, round, and reactive to light.  Neck:     Vascular: No carotid bruit.  Cardiovascular:     Rate and Rhythm: Normal rate and regular rhythm.     Heart sounds: Normal heart sounds.  Pulmonary:     Effort: Pulmonary effort is normal.     Breath sounds: Normal breath sounds. No stridor.  Abdominal:     Palpations: Abdomen is soft. There is no pulsatile mass.     Tenderness: There is no abdominal tenderness.  Musculoskeletal:     Right lower leg: No edema.     Left lower leg: No edema.  Skin:    General: Skin is warm and dry.  Neurological:     Mental Status: She is alert and oriented to person, place, and time.  Psychiatric:        Mood and Affect: Mood normal.        Behavior: Behavior normal.        Assessment & Plan:  Erin Mckee is a 78 y.o. female . Hyperlipidemia, unspecified hyperlipidemia type - Plan: atorvastatin  (LIPITOR) 10 MG tablet, Comprehensive metabolic panel with  GFR, Lipid panel  -  Stable, tolerating current regimen. Medications refilled. Labs pending as above.   Essential hypertension - Plan: hydrochlorothiazide  (HYDRODIURIL ) 25 MG tablet, metoprolol  succinate (TOPROL -XL) 50 MG 24 hr tablet, Comprehensive metabolic panel with GFR  - Borderline, avoid salty foods, handout given on salty 6, handout given on management of hypertension.  RTC precautions if elevated readings but will continue same regimen for now.  Vocal cord dysfunction - Plan: LORazepam  (ATIVAN ) 1 MG tablet Dysphonia - Plan: LORazepam  (ATIVAN ) 1 MG tablet  - Some intermittent flares as above, will continue lorazepam  for now.  Declines follow-up with previous specialist at this time, option of second opinion if needed.  No changes for now.  No orders of the defined types were placed in this encounter.  Patient Instructions  Thank you for coming in today.  I will check labs and let you know if there are any concerns.  See the handout on salty foods but other information below on management of hypertension.  Based on today's reading I do not think you need to be on any different medications but if you notice any persistent readings in the 140 range or above 90 diastolic, let me know and we can adjust meds.  No change in lorazepam  for now, but if any continued worsening of vocal cord dysfunction/dysphonia, we can discuss alternate options or second opinion if needed for alternate treatment.  Again I think it is reasonable to continue same plan for now.  Let me know if there are questions and I will see you in 6 months.  Managing Your Hypertension Hypertension, also called high blood pressure, is when the force of the blood pressing against the walls of the arteries is too strong. Arteries are blood vessels that carry blood from your heart throughout your body. Hypertension forces the heart to work harder to pump blood and may cause the arteries to become narrow or stiff. Understanding blood  pressure readings  A blood pressure reading includes a higher number over a lower number: The first, or top, number is called the systolic pressure. It is a measure of the pressure in your arteries as your heart beats. The second, or bottom number, is called the diastolic pressure. It is a measure of the pressure in your arteries as the heart relaxes. For most people, a normal blood pressure is below 120/80. Your personal target blood pressure may vary depending on your medical conditions, your age, and other factors. Blood pressure is classified into four stages. Based on your blood pressure reading, your health care provider may use the following stages to determine what type of treatment you need, if any. Systolic pressure and diastolic pressure are measured in a unit called millimeters of mercury (mmHg). Normal Systolic pressure: below 120. Diastolic pressure: below 80. Elevated Systolic pressure: 120-129. Diastolic pressure: below 80. Hypertension stage 1 Systolic pressure: 130-139. Diastolic pressure: 80-89. Hypertension stage 2 Systolic pressure: 140 or above. Diastolic pressure: 90 or above. How can this condition affect me? Managing your hypertension is very important. Over time, hypertension can damage the arteries and decrease blood flow to parts of the body, including the brain, heart, and kidneys. Having untreated or uncontrolled hypertension can lead to: A heart attack. A stroke. A weakened blood vessel (aneurysm). Heart failure. Kidney damage. Eye damage. Memory and concentration problems. Vascular dementia. What actions can I take to manage this condition? Hypertension can be managed by making lifestyle changes and possibly by taking medicines. Your health care provider will help you make a plan to bring your blood pressure within a normal range. You may be referred for counseling on a healthy diet and physical activity. Nutrition  Eat a diet that is high in fiber and  potassium, and low in salt (sodium), added sugar, and fat. An example eating plan is called the DASH diet. DASH stands for Dietary Approaches to Stop Hypertension. To eat this way: Eat plenty of fresh fruits and vegetables. Try to fill one-half of your plate at each meal with fruits and vegetables. Eat whole grains, such as whole-wheat pasta, brown rice, or whole-grain bread. Fill about one-fourth of your plate with whole grains. Eat low-fat dairy products. Avoid fatty cuts of meat, processed or cured meats, and poultry with skin. Fill about one-fourth of your plate with lean proteins such as fish, chicken without skin, beans, eggs, and tofu. Avoid pre-made and processed foods. These tend to be higher in sodium, added sugar, and fat. Reduce your daily sodium intake. Many people with hypertension should eat less than 1,500 mg of sodium a day. Lifestyle  Work with your health care provider to maintain a healthy body weight or to lose weight. Ask what an ideal weight is for you. Get at least 30 minutes of exercise that causes your heart to beat faster (aerobic exercise) most days of the week. Activities may include walking, swimming, or biking. Include exercise to strengthen your muscles (resistance exercise), such as weight lifting, as part of your weekly exercise routine. Try to do these types of exercises for 30 minutes at least 3 days a week. Do not use any products that contain nicotine or tobacco. These products include cigarettes, chewing tobacco, and vaping devices, such as e-cigarettes. If you need help quitting, ask your health care provider. Control any long-term (chronic) conditions you have, such as high cholesterol or diabetes. Identify your sources of stress and find ways to manage stress. This may include meditation, deep breathing, or making  time for fun activities. Alcohol use Do not drink alcohol if: Your health care provider tells you not to drink. You are pregnant, may be  pregnant, or are planning to become pregnant. If you drink alcohol: Limit how much you have to: 0-1 drink a day for women. 0-2 drinks a day for men. Know how much alcohol is in your drink. In the [place] one drink equals one 12 oz bottle of beer (355 mL), one 5 oz glass of wine (148 mL), or one 1 oz glass of hard liquor (44 mL). Medicines Your health care provider may prescribe medicine if lifestyle changes are not enough to get your blood pressure under control and if: Your systolic blood pressure is 130 or higher. Your diastolic blood pressure is 80 or higher. Take medicines only as told by your health care provider. Follow the directions carefully. Blood pressure medicines must be taken as told by your health care provider. The medicine does not work as well when you skip doses. Skipping doses also puts you at risk for problems. Monitoring Before you monitor your blood pressure: Do not smoke, drink caffeinated beverages, or exercise within 30 minutes before taking a measurement. Use the bathroom and empty your bladder (urinate). Sit quietly for at least 5 minutes before taking measurements. Monitor your blood pressure at home as told by your health care provider. To do this: Sit with your back straight and supported. Place your feet flat on the floor. Do not cross your legs. Support your arm on a flat surface, such as a table. Make sure your upper arm is at heart level. Each time you measure, take two or three readings one minute apart and record the results. You may also need to have your blood pressure checked regularly by your health care provider. General information Talk with your health care provider about your diet, exercise habits, and other lifestyle factors that may be contributing to hypertension. Review all the medicines you take with your health care provider because there may be side effects or interactions. Keep all follow-up visits. Your health care provider can help you  create and adjust your plan for managing your high blood pressure. Where to find more information National Heart, Lung, and Blood Institute: PopSteam.is American Heart Association: www.heart.org Contact a health care provider if: You think you are having a reaction to medicines you have taken. You have repeated (recurrent) headaches. You feel dizzy. You have swelling in your ankles. You have trouble with your vision. Get help right away if: You develop a severe headache or confusion. You have unusual weakness or numbness, or you feel faint. You have severe pain in your chest or abdomen. You vomit repeatedly. You have trouble breathing. These symptoms may be an emergency. Get help right away. Call 911. Do not wait to see if the symptoms will go away. Do not drive yourself to the hospital. Summary Hypertension is when the force of blood pumping through your arteries is too strong. If this condition is not controlled, it may put you at risk for serious complications. Your personal target blood pressure may vary depending on your medical conditions, your age, and other factors. For most people, a normal blood pressure is less than 120/80. Hypertension is managed by lifestyle changes, medicines, or both. Lifestyle changes to help manage hypertension include losing weight, eating a healthy, low-sodium diet, exercising more, stopping smoking, and limiting alcohol. This information is not intended to replace advice given to you by your health care provider. Make  sure you discuss any questions you have with your health care provider. Document Revised: 12/01/2020 Document Reviewed: 12/01/2020 Elsevier Patient Education  2024 Elsevier Inc.     Signed,   Caro Christmas, MD [facility], [facility] 08/08/23 9:26 AM

## 2023-08-08 NOTE — Patient Instructions (Signed)
 Thank you for coming in today.  I will check labs and let you know if there are any concerns.  See the handout on salty foods but other information below on management of hypertension.  Based on today's reading I do not think you need to be on any different medications but if you notice any persistent readings in the 140 range or above 90 diastolic, let me know and we can adjust meds.  No change in lorazepam  for now, but if any continued worsening of vocal cord dysfunction/dysphonia, we can discuss alternate options or second opinion if needed for alternate treatment.  Again I think it is reasonable to continue same plan for now.  Let me know if there are questions and I will see you in 6 months.  Managing Your Hypertension Hypertension, also called high blood pressure, is when the force of the blood pressing against the walls of the arteries is too strong. Arteries are blood vessels that carry blood from your heart throughout your body. Hypertension forces the heart to work harder to pump blood and may cause the arteries to become narrow or stiff. Understanding blood pressure readings A blood pressure reading includes a higher number over a lower number: The first, or top, number is called the systolic pressure. It is a measure of the pressure in your arteries as your heart beats. The second, or bottom number, is called the diastolic pressure. It is a measure of the pressure in your arteries as the heart relaxes. For most people, a normal blood pressure is below 120/80. Your personal target blood pressure may vary depending on your medical conditions, your age, and other factors. Blood pressure is classified into four stages. Based on your blood pressure reading, your health care provider may use the following stages to determine what type of treatment you need, if any. Systolic pressure and diastolic pressure are measured in a unit called millimeters of mercury (mmHg). Normal Systolic pressure: below  120. Diastolic pressure: below 80. Elevated Systolic pressure: 120-129. Diastolic pressure: below 80. Hypertension stage 1 Systolic pressure: 130-139. Diastolic pressure: 80-89. Hypertension stage 2 Systolic pressure: 140 or above. Diastolic pressure: 90 or above. How can this condition affect me? Managing your hypertension is very important. Over time, hypertension can damage the arteries and decrease blood flow to parts of the body, including the brain, heart, and kidneys. Having untreated or uncontrolled hypertension can lead to: A heart attack. A stroke. A weakened blood vessel (aneurysm). Heart failure. Kidney damage. Eye damage. Memory and concentration problems. Vascular dementia. What actions can I take to manage this condition? Hypertension can be managed by making lifestyle changes and possibly by taking medicines. Your health care provider will help you make a plan to bring your blood pressure within a normal range. You may be referred for counseling on a healthy diet and physical activity. Nutrition  Eat a diet that is high in fiber and potassium, and low in salt (sodium), added sugar, and fat. An example eating plan is called the DASH diet. DASH stands for Dietary Approaches to Stop Hypertension. To eat this way: Eat plenty of fresh fruits and vegetables. Try to fill one-half of your plate at each meal with fruits and vegetables. Eat whole grains, such as whole-wheat pasta, brown rice, or whole-grain bread. Fill about one-fourth of your plate with whole grains. Eat low-fat dairy products. Avoid fatty cuts of meat, processed or cured meats, and poultry with skin. Fill about one-fourth of your plate with lean proteins such  as fish, chicken without skin, beans, eggs, and tofu. Avoid pre-made and processed foods. These tend to be higher in sodium, added sugar, and fat. Reduce your daily sodium intake. Many people with hypertension should eat less than 1,500 mg of sodium a  day. Lifestyle  Work with your health care provider to maintain a healthy body weight or to lose weight. Ask what an ideal weight is for you. Get at least 30 minutes of exercise that causes your heart to beat faster (aerobic exercise) most days of the week. Activities may include walking, swimming, or biking. Include exercise to strengthen your muscles (resistance exercise), such as weight lifting, as part of your weekly exercise routine. Try to do these types of exercises for 30 minutes at least 3 days a week. Do not use any products that contain nicotine or tobacco. These products include cigarettes, chewing tobacco, and vaping devices, such as e-cigarettes. If you need help quitting, ask your health care provider. Control any long-term (chronic) conditions you have, such as high cholesterol or diabetes. Identify your sources of stress and find ways to manage stress. This may include meditation, deep breathing, or making time for fun activities. Alcohol use Do not drink alcohol if: Your health care provider tells you not to drink. You are pregnant, may be pregnant, or are planning to become pregnant. If you drink alcohol: Limit how much you have to: 0-1 drink a day for women. 0-2 drinks a day for men. Know how much alcohol is in your drink. In the [place] one drink equals one 12 oz bottle of beer (355 mL), one 5 oz glass of wine (148 mL), or one 1 oz glass of hard liquor (44 mL). Medicines Your health care provider may prescribe medicine if lifestyle changes are not enough to get your blood pressure under control and if: Your systolic blood pressure is 130 or higher. Your diastolic blood pressure is 80 or higher. Take medicines only as told by your health care provider. Follow the directions carefully. Blood pressure medicines must be taken as told by your health care provider. The medicine does not work as well when you skip doses. Skipping doses also puts you at risk for  problems. Monitoring Before you monitor your blood pressure: Do not smoke, drink caffeinated beverages, or exercise within 30 minutes before taking a measurement. Use the bathroom and empty your bladder (urinate). Sit quietly for at least 5 minutes before taking measurements. Monitor your blood pressure at home as told by your health care provider. To do this: Sit with your back straight and supported. Place your feet flat on the floor. Do not cross your legs. Support your arm on a flat surface, such as a table. Make sure your upper arm is at heart level. Each time you measure, take two or three readings one minute apart and record the results. You may also need to have your blood pressure checked regularly by your health care provider. General information Talk with your health care provider about your diet, exercise habits, and other lifestyle factors that may be contributing to hypertension. Review all the medicines you take with your health care provider because there may be side effects or interactions. Keep all follow-up visits. Your health care provider can help you create and adjust your plan for managing your high blood pressure. Where to find more information National Heart, Lung, and Blood Institute: PopSte[employer].is American Heart Association: www.heart.org Contact a health care provider if: You think you are having a reaction to  medicines you have taken. You have repeated (recurrent) headaches. You feel dizzy. You have swelling in your ankles. You have trouble with your vision. Get help right away if: You develop a severe headache or confusion. You have unusual weakness or numbness, or you feel faint. You have severe pain in your chest or abdomen. You vomit repeatedly. You have trouble breathing. These symptoms may be an emergency. Get help right away. Call 911. Do not wait to see if the symptoms will go away. Do not drive yourself to the hospital. Summary Hypertension  is when the force of blood pumping through your arteries is too strong. If this condition is not controlled, it may put you at risk for serious complications. Your personal target blood pressure may vary depending on your medical conditions, your age, and other factors. For most people, a normal blood pressure is less than 120/80. Hypertension is managed by lifestyle changes, medicines, or both. Lifestyle changes to help manage hypertension include losing weight, eating a healthy, low-sodium diet, exercising more, stopping smoking, and limiting alcohol. This information is not intended to replace advice given to you by your health care provider. Make sure you discuss any questions you have with your health care provider. Document Revised: 12/01/2020 Document Reviewed: 12/01/2020 Elsevier Patient Education  2024 [employer].

## 2023-08-11 ENCOUNTER — Encounter: Payer: Self-pay | Admitting: Family Medicine

## 2023-08-12 ENCOUNTER — Ambulatory Visit: Payer: Medicare Other | Admitting: Family Medicine

## 2023-08-16 ENCOUNTER — Ambulatory Visit: Payer: Self-pay

## 2023-08-21 ENCOUNTER — Encounter: Payer: Self-pay | Admitting: Gastroenterology

## 2023-08-21 ENCOUNTER — Ambulatory Visit (AMBULATORY_SURGERY_CENTER): Admitting: Gastroenterology

## 2023-08-21 VITALS — BP 133/76 | HR 48 | Temp 97.8°F | Resp 7 | Ht 64.0 in | Wt 165.0 lb

## 2023-08-21 DIAGNOSIS — Z860100 Personal history of colon polyps, unspecified: Secondary | ICD-10-CM

## 2023-08-21 DIAGNOSIS — K5730 Diverticulosis of large intestine without perforation or abscess without bleeding: Secondary | ICD-10-CM | POA: Diagnosis not present

## 2023-08-21 DIAGNOSIS — E785 Hyperlipidemia, unspecified: Secondary | ICD-10-CM | POA: Diagnosis not present

## 2023-08-21 DIAGNOSIS — D123 Benign neoplasm of transverse colon: Secondary | ICD-10-CM

## 2023-08-21 DIAGNOSIS — I10 Essential (primary) hypertension: Secondary | ICD-10-CM | POA: Diagnosis not present

## 2023-08-21 DIAGNOSIS — Z860101 Personal history of adenomatous and serrated colon polyps: Secondary | ICD-10-CM | POA: Diagnosis not present

## 2023-08-21 DIAGNOSIS — D125 Benign neoplasm of sigmoid colon: Secondary | ICD-10-CM

## 2023-08-21 DIAGNOSIS — K635 Polyp of colon: Secondary | ICD-10-CM

## 2023-08-21 DIAGNOSIS — K648 Other hemorrhoids: Secondary | ICD-10-CM | POA: Diagnosis not present

## 2023-08-21 DIAGNOSIS — D124 Benign neoplasm of descending colon: Secondary | ICD-10-CM | POA: Diagnosis not present

## 2023-08-21 DIAGNOSIS — K644 Residual hemorrhoidal skin tags: Secondary | ICD-10-CM

## 2023-08-21 DIAGNOSIS — Z1211 Encounter for screening for malignant neoplasm of colon: Secondary | ICD-10-CM

## 2023-08-21 MED ORDER — SODIUM CHLORIDE 0.9 % IV SOLN: 500.0000 mL | Freq: Once | INTRAVENOUS | Status: DC

## 2023-08-21 NOTE — Progress Notes (Signed)
 [facility] History and Physical   Primary Care Physician:  Benjiman Bras, MD   Reason for Procedure:  History of adenomatous colon polyps  Plan:    Surveillance colonoscopy with possible interventions as needed     HPI: Erin Mckee is a very pleasant 78 y.o. female here for surveillance colonoscopy. Denies any nausea, vomiting, abdominal pain, melena or bright red blood per rectum  The risks and benefits as well as alternatives of endoscopic procedure(s) have been discussed and reviewed. All questions answered. The patient agrees to proceed.    Past Medical History:  Diagnosis Date   GERD (gastroesophageal reflux disease)    spasmodic dysphonia   Heart murmur    resolved- related to stress of throat closing , echo  done 2009   Hepatitis    hep- a as a child   Hyperlipidemia    Hypertension    PCP manages htn, pt. also reports that she has seen Cardiology  at [facility] too.    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    Past Surgical History:  Procedure Laterality Date   CATARACT EXTRACTION Bilateral    june 2019 and january 2022   COLONOSCOPY  2016   Dr Arvie Latus   FRACTURE SURGERY     OPEN REDUCTION INTERNAL FIXATION (ORIF) DISTAL RADIAL FRACTURE  02/14/2012   Procedure: OPEN REDUCTION INTERNAL FIXATION (ORIF) DISTAL RADIAL FRACTURE;  Surgeon: Arnie Lao, MD;  Location: [facility] OR;  Service: Orthopedics;  Laterality: Right;  Open Reduction Internal Fixation Right Wrist   ORIF ANKLE FRACTURE Left 06/30/2019   Procedure: OPEN REDUCTION INTERNAL FIXATION (ORIF) LEFT ANKLE FRACTURE;  Surgeon: Jasmine Mesi, MD;  Location: [facility] OR;  Service: Orthopedics;  Laterality: Left;   TONSILLECTOMY     2009    Prior to Admission medications   Medication Sig Start Date End Date Taking? Authorizing Provider  atorvastatin  (LIPITOR) 10 MG tablet TAKE 1 TABLET(10 MG) BY MOUTH DAILY 08/08/23   Yes Benjiman Bras, MD  hydrochlorothiazide  (HYDRODIURIL ) 25 MG tablet TAKE 1 TABLET(25 MG) BY MOUTH DAILY 08/08/23  Yes Benjiman Bras, MD  LORazepam  (ATIVAN ) 1 MG tablet TAKE 1/2 TO 1 TABLET(0.5 TO 1 MG) BY MOUTH TWICE DAILY AS NEEDED FOR THROAT SPASMS 08/08/23  Yes Benjiman Bras, MD  metoprolol  succinate (TOPROL -XL) 50 MG 24 hr tablet TAKE 1 TABLET(50 MG) BY MOUTH DAILY 08/08/23  Yes Benjiman Bras, MD    Current Outpatient Medications  Medication Sig Dispense Refill   atorvastatin  (LIPITOR) 10 MG tablet TAKE 1 TABLET(10 MG) BY MOUTH DAILY 90 tablet 3   hydrochlorothiazide  (HYDRODIURIL ) 25 MG tablet TAKE 1 TABLET(25 MG) BY MOUTH DAILY 90 tablet 3   LORazepam  (ATIVAN ) 1 MG tablet TAKE 1/2 TO 1 TABLET(0.5 TO 1 MG) BY MOUTH TWICE DAILY AS NEEDED FOR THROAT SPASMS 60 tablet 0   metoprolol  succinate (TOPROL -XL) 50 MG 24 hr tablet TAKE 1 TABLET(50 MG) BY MOUTH DAILY 90 tablet 3   Current Facility-Administered Medications  Medication Dose Route Frequency Provider Last Rate Last Admin   0.9 %  sodium chloride  infusion  500 mL Intravenous Once Antasia Haider V, MD        Allergies as of 08/21/2023   (No Known Allergies)    Family History  Problem Relation Age of Onset   Hypertension Mother    Heart disease Mother 81  CAD/CABG   Hyperlipidemia Mother    Hypertension Father    Heart disease Father        AMI   Hyperlipidemia Father    Multiple myeloma Father    Hypertension Sister    Thyroid disease Sister    Hypertension Brother    Crohn's disease Brother    Cerebral palsy Son    Hydrocephalus Son    Colon cancer Neg Hx    Rectal cancer Neg Hx    Stomach cancer Neg Hx     Social History   Socioeconomic History   Marital status: Married    Spouse name: Not on file   Number of children: 2   Years of education: Not on file   Highest education level: Professional school degree (e.g., MD, DDS, DVM, JD)  Occupational History   Occupation: pharmacist     Comment: retired 2016; now PRN    Employer: [employer]  Tobacco Use   Smoking status: Never   Smokeless tobacco: Never  Vaping Use   Vaping status: Never Used  Substance and Sexual Activity   Alcohol use: Yes    Alcohol/week: 6.0 standard drinks of alcohol    Types: 6 Glasses of wine per week    Comment: Occasional   Drug use: No   Sexual activity: Yes    Birth control/protection: Post-menopausal  Other Topics Concern   Not on file  Social History Narrative   Futures trader   Married   Exercises   Social Drivers of Health   Financial Resource Strain: Low Risk  (03/19/2023)   Overall Financial Resource Strain (CARDIA)    Difficulty of Paying Living Expenses: Not hard at all  Food Insecurity: No Food Insecurity (03/19/2023)   Hunger Vital Sign    Worried About Running Out of Food in the Last Year: Never true    Ran Out of Food in the Last Year: Never true  Transportation Needs: No Transportation Needs (03/19/2023)   PRAPARE - Administrator, Civil Service (Medical): No    Lack of Transportation (Non-Medical): No  Physical Activity: Sufficiently Active (03/19/2023)   Exercise Vital Sign    Days of Exercise per Week: 4 days    Minutes of Exercise per Session: 50 min  Stress: No Stress Concern Present (03/19/2023)   [employer] of Occupational Health - Occupational Stress Questionnaire    Feeling of Stress : Not at all  Social Connections: Socially Integrated (03/19/2023)   Social Connection and Isolation Panel [NHANES]    Frequency of Communication with Friends and Family: More than three times a week    Frequency of Social Gatherings with Friends and Family: More than three times a week    Attends Religious Services: More than 4 times per year    Active Member of [employer] or Organizations: Not on file    Attends Banker Meetings: More than 4 times per year    Marital Status: Married  Catering manager Violence: Not At Risk (03/19/2023)    Humiliation, Afraid, Rape, and Kick questionnaire    Fear of Current or Ex-Partner: No    Emotionally Abused: No    Physically Abused: No    Sexually Abused: No    Review of Systems:  All other review of systems negative except as mentioned in the HPI.  Physical Exam: Vital signs in last 24 hours: BP (!) 158/98   Pulse (!) 58   Temp 97.8 F (36.6 C) (Temporal)   Ht  5\' 4"  (1.626 m)   Wt 165 lb (74.8 kg)   SpO2 96%   BMI 28.32 kg/m  General:   Alert, NAD Lungs:  Clear .   Heart:  Regular rate and rhythm Abdomen:  Soft, nontender and nondistended. Neuro/Psych:  Alert and cooperative. Normal mood and affect. A and O x 3  Reviewed labs, radiology imaging, old records and pertinent past GI work up  Patient is appropriate for planned procedure(s) and anesthesia in an ambulatory setting   K. Veena Sayge Salvato , MD [phone]

## 2023-08-21 NOTE — Patient Instructions (Signed)
 Handouts provided on polyps, diverticulosis and hemorrhoids. Resume previous diet.  Continue present medications.  Await pathology results. No repeat colonoscopy due to age.   YOU HAD AN ENDOSCOPIC PROCEDURE TODAY AT THE [facility]:   Refer to the procedure report that was given to you for any specific questions about what was found during the examination.  If the procedure report does not answer your questions, please call your gastroenterologist to clarify.  If you requested that your care partner not be given the details of your procedure findings, then the procedure report has been included in a sealed envelope for you to review at your convenience later.  YOU SHOULD EXPECT: Some feelings of bloating in the abdomen. Passage of more gas than usual.  Walking can help get rid of the air that was put into your GI tract during the procedure and reduce the bloating. If you had a lower endoscopy (such as a colonoscopy or flexible sigmoidoscopy) you may notice spotting of blood in your stool or on the toilet paper. If you underwent a bowel prep for your procedure, you may not have a normal bowel movement for a few days.  Please Note:  You might notice some irritation and congestion in your nose or some drainage.  This is from the oxygen used during your procedure.  There is no need for concern and it should clear up in a day or so.  SYMPTOMS TO REPORT IMMEDIATELY:  Following lower endoscopy (colonoscopy or flexible sigmoidoscopy):  Excessive amounts of blood in the stool  Significant tenderness or worsening of abdominal pains  Swelling of the abdomen that is new, acute  Fever of 100F or higher  For urgent or emergent issues, a gastroenterologist can be reached at any hour by calling [phone]. Do not use MyChart messaging for urgent concerns.    DIET:  We do recommend a small meal at first, but then you may proceed to your regular diet.  Drink plenty of fluids but you should  avoid alcoholic beverages for 24 hours.  ACTIVITY:  You should plan to take it easy for the rest of today and you should NOT DRIVE or use heavy machinery until tomorrow (because of the sedation medicines used during the test).    FOLLOW UP: Our staff will call the number listed on your records the next business day following your procedure.  We will call around 7:15- 8:00 am to check on you and address any questions or concerns that you may have regarding the information given to you following your procedure. If we do not reach you, we will leave a message.     If any biopsies were taken you will be contacted by phone or by letter within the next 1-3 weeks.  Please call us at [phone] if you have not heard about the biopsies in 3 weeks.    SIGNATURES/CONFIDENTIALITY: You and/or your care partner have signed paperwork which will be entered into your electronic medical record.  These signatures attest to the fact that that the information above on your After Visit Summary has been reviewed and is understood.  Full responsibility of the confidentiality of this discharge information lies with you and/or your care-partner.

## 2023-08-21 NOTE — Progress Notes (Signed)
 Pt's states no medical or surgical changes since previsit or office visit.

## 2023-08-21 NOTE — Op Note (Signed)
 [facility] Patient Name: Erin Mckee Procedure Date: 08/21/2023 11:15 AM MRN: [id number] Endoscopist: Sergio Dandy , MD, [id number] Age: 78 Referring MD:  Date of Birth: 1945/09/26 Gender: Female Account #: [id number] Procedure:                Colonoscopy Indications:              High risk colon cancer surveillance: Personal                            history of colonic polyps Medicines:                Monitored Anesthesia Care Procedure:                Pre-Anesthesia Assessment:                           - Prior to the procedure, a History and Physical                            was performed, and patient medications and                            allergies were reviewed. The patient's tolerance of                            previous anesthesia was also reviewed. The risks                            and benefits of the procedure and the sedation                            options and risks were discussed with the patient.                            All questions were answered, and informed consent                            was obtained. Prior Anticoagulants: The patient has                            taken no anticoagulant or antiplatelet agents. ASA                            Grade Assessment: II - A patient with mild systemic                            disease. After reviewing the risks and benefits,                            the patient was deemed in satisfactory condition to                            undergo the procedure.  After obtaining informed consent, the colonoscope                            was passed under direct vision. Throughout the                            procedure, the patient's blood pressure, pulse, and                            oxygen saturations were monitored continuously. The                            Olympus Scope SN: [id number] was introduced through                            the anus and advanced to the  the cecum, identified                            by appendiceal orifice and ileocecal valve. The                            colonoscopy was performed without difficulty. The                            patient tolerated the procedure well. The quality                            of the bowel preparation was good. The ileocecal                            valve, appendiceal orifice, and rectum were                            photographed. Scope In: 11:23:17 AM Scope Out: 11:40:47 AM Scope Withdrawal Time: 0 hours 14 minutes 20 seconds  Total Procedure Duration: 0 hours 17 minutes 30 seconds  Findings:                 The perianal and digital rectal examinations were                            normal.                           Two sessile polyps were found in the sigmoid colon                            and transverse colon. The polyps were 4 to 5 mm in                            size. These polyps were removed with a cold snare.                            Resection and retrieval were complete.  A 2 mm polyp was found in the descending colon. The                            polyp was sessile. The polyp was removed with a                            cold biopsy forceps. Resection and retrieval were                            complete.                           Scattered small-mouthed diverticula were found in                            the sigmoid colon and descending colon.                           Non-bleeding external and internal hemorrhoids were                            found during retroflexion. The hemorrhoids were                            small. Complications:            No immediate complications. Estimated Blood Loss:     Estimated blood loss was minimal. Impression:               - Two 4 to 5 mm polyps in the sigmoid colon and in                            the transverse colon, removed with a cold snare.                            Resected and  retrieved.                           - One 2 mm polyp in the descending colon, removed                            with a cold biopsy forceps. Resected and retrieved.                           - Diverticulosis in the sigmoid colon and in the                            descending colon.                           - Non-bleeding external and internal hemorrhoids. Recommendation:           - Patient has a contact number available for  emergencies. The signs and symptoms of potential                            delayed complications were discussed with the                            patient. Return to normal activities tomorrow.                            Written discharge instructions were provided to the                            patient.                           - Resume previous diet.                           - Continue present medications.                           - Await pathology results.                           - No repeat colonoscopy due to age. Hoang Reich V. Jene Huq, MD 08/21/2023 11:46:31 AM This report has been signed electronically.

## 2023-08-21 NOTE — Progress Notes (Signed)
 Sedate, gd SR, tolerated procedure well, VSS, report to RN

## 2023-08-21 NOTE — Progress Notes (Signed)
 Called to room to assist during endoscopic procedure.  Patient ID and intended procedure confirmed with present staff. Received instructions for my participation in the procedure from the performing physician.

## 2023-08-22 ENCOUNTER — Telehealth: Payer: Self-pay | Admitting: *Deleted

## 2023-08-22 NOTE — Telephone Encounter (Signed)
  Follow up Call-     08/21/2023   10:43 AM  Call back number  Post procedure Call Back phone  # [phone]  Permission to leave phone message Yes     Patient questions:  Do you have a fever, pain , or abdominal swelling? No. Pain Score  0 *  Have you tolerated food without any problems? Yes.    Have you been able to return to your normal activities? Yes.    Do you have any questions about your discharge instructions: Diet   No. Medications  No. Follow up visit  No.  Do you have questions or concerns about your Care? No.  Actions: * If pain score is 4 or above: No action needed, pain <4.

## 2023-08-23 LAB — SURGICAL PATHOLOGY

## 2023-10-17 ENCOUNTER — Other Ambulatory Visit: Payer: Self-pay | Admitting: Family Medicine

## 2023-10-17 DIAGNOSIS — I10 Essential (primary) hypertension: Secondary | ICD-10-CM

## 2023-11-01 ENCOUNTER — Ambulatory Visit: Payer: Self-pay | Admitting: Gastroenterology

## 2024-02-12 ENCOUNTER — Ambulatory Visit (INDEPENDENT_AMBULATORY_CARE_PROVIDER_SITE_OTHER): Admitting: Family Medicine

## 2024-02-12 ENCOUNTER — Encounter: Payer: Self-pay | Admitting: Family Medicine

## 2024-02-12 DIAGNOSIS — R490 Dysphonia: Secondary | ICD-10-CM

## 2024-02-12 DIAGNOSIS — I10 Essential (primary) hypertension: Secondary | ICD-10-CM

## 2024-02-12 DIAGNOSIS — E785 Hyperlipidemia, unspecified: Secondary | ICD-10-CM

## 2024-02-12 DIAGNOSIS — J383 Other diseases of vocal cords: Secondary | ICD-10-CM

## 2024-02-12 LAB — COMPREHENSIVE METABOLIC PANEL WITH GFR
ALT: 26 U/L (ref 0–35)
AST: 23 U/L (ref 0–37)
Albumin: 4.6 g/dL (ref 3.5–5.2)
Alkaline Phosphatase: 68 U/L (ref 39–117)
BUN: 17 mg/dL (ref 6–23)
CO2: 30 meq/L (ref 19–32)
Calcium: 10 mg/dL (ref 8.4–10.5)
Chloride: 100 meq/L (ref 96–112)
Creatinine, Ser: 0.87 mg/dL (ref 0.40–1.20)
GFR: 63.8 mL/min (ref >60.00)
Glucose, Bld: 93 mg/dL (ref 70–99)
Potassium: 3.4 meq/L — ABNORMAL LOW (ref 3.5–5.1)
Sodium: 139 meq/L (ref 135–145)
Total Bilirubin: 0.6 mg/dL (ref 0.2–1.2)
Total Protein: 7.2 g/dL (ref 6.0–8.3)

## 2024-02-12 LAB — LIPID PANEL
Cholesterol: 153 mg/dL (ref 0–200)
HDL: 57.9 mg/dL (ref >39.00)
LDL Cholesterol: 70 mg/dL (ref 0–99)
NonHDL: 94.68
Total CHOL/HDL Ratio: 3
Triglycerides: 124 mg/dL (ref 0.0–149.0)
VLDL: 24.8 mg/dL (ref 0.0–40.0)

## 2024-02-12 MED ORDER — HYDROCHLOROTHIAZIDE 25 MG PO TABS
ORAL_TABLET | ORAL | 3 refills | Status: AC
Start: 2024-02-12 — End: ?

## 2024-02-12 MED ORDER — ATORVASTATIN CALCIUM 10 MG PO TABS: ORAL_TABLET | ORAL | 3 refills | Status: AC

## 2024-02-12 MED ORDER — LORAZEPAM 1 MG PO TABS
ORAL_TABLET | ORAL | 0 refills | Status: AC
Start: 2024-02-12 — End: ?

## 2024-02-12 MED ORDER — METOPROLOL SUCCINATE ER 50 MG PO TB24
ORAL_TABLET | ORAL | 3 refills | Status: AC
Start: 2024-02-12 — End: ?

## 2024-02-12 NOTE — Progress Notes (Signed)
 Subjective:  Patient ID: Erin Mckee, female    DOB: 1945/06/27  Age: 78 y.o. MRN: [id number]  CC:  Chief Complaint  Patient presents with   Follow-up    6 month f/u Flu shot- not today Cscope- up to date Mammogram- declined    HPI Erin Mckee presents for   Hyperlipidemia: Treated Lipitor 10 mg daily, denies any myalgias or side effects. Fasting today.  Lab Results  Component Value Date   CHOL 153 08/08/2023   HDL 55.50 08/08/2023   LDLCALC 75 08/08/2023   TRIG 115.0 08/08/2023   CHOLHDL 3 08/08/2023   Lab Results  Component Value Date   ALT 18 08/08/2023   AST 18 08/08/2023   ALKPHOS 68 08/08/2023   BILITOT 0.5 08/08/2023    Hypertension: Treated hydrochlorothiazide  25 mg daily, Toprol -XL 50 mg daily.  Tolerating current dose without any side effects. Home readings: variable. 120-130/70-80 BP Readings from Last 3 Encounters:  02/12/24 122/80  08/21/23 133/76  08/08/23 138/72   Lab Results  Component Value Date   CREATININE 0.78 08/08/2023   Dysphonia with vocal cord dysfunction See prior visits.  Rare Ativan  for flare symptoms previously, which had typically been at work, manages with breathing exercises.  Intermittent dosing of lorazepam .  Controlled substance database reviewed.Lorazepam  1 mg #60 last filled on 08/08/2023, previously 02/13/2023. Still using as needed - variable need. Sometimes every 2-3 days, sometimes every few weeks. Still effective, no new side effects, no falls/dizziness.    HM: Declines shingrix - had zostavax. Recommendations given.  Declines covid booster.  Flu vaccine - considering - plans at pharmacy.   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 also reports that she has seen Cardiology  at [facility] too.    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   Past Surgical History:  Procedure Laterality Date   CATARACT EXTRACTION Bilateral    june 2019 and january 2022   COLONOSCOPY  2016   Dr Debrah   FRACTURE SURGERY     OPEN REDUCTION INTERNAL FIXATION (ORIF) DISTAL RADIAL FRACTURE  02/14/2012   Procedure: OPEN REDUCTION INTERNAL FIXATION (ORIF) DISTAL RADIAL FRACTURE;  Surgeon: Lonni CINDERELLA Poli, MD;  Location: [facility] OR;  Service: Orthopedics;  Laterality: Right;  Open Reduction Internal Fixation Right Wrist   ORIF ANKLE FRACTURE Left 06/30/2019   Procedure: OPEN REDUCTION INTERNAL FIXATION (ORIF) LEFT ANKLE FRACTURE;  Surgeon: Addie Cordella Hamilton, MD;  Location: [facility] OR;  Service: Orthopedics;  Laterality: Left;   TONSILLECTOMY     2009   No Known Allergies Prior to Admission medications   Medication Sig Start Date End Date Taking? Authorizing Provider  atorvastatin  (LIPITOR) 10 MG tablet TAKE 1 TABLET(10 MG) BY MOUTH DAILY 08/08/23  Yes Levora Reyes SAUNDERS, MD  hydrochlorothiazide  (HYDRODIURIL ) 25 MG tablet TAKE 1 TABLET(25 MG) BY MOUTH DAILY 08/08/23  Yes Levora Reyes SAUNDERS, MD  LORazepam  (ATIVAN ) 1 MG tablet TAKE 1/2 TO 1 TABLET(0.5 TO 1 MG) BY MOUTH TWICE DAILY AS NEEDED FOR THROAT SPASMS 08/08/23  Yes Levora Reyes SAUNDERS, MD  metoprolol  succinate (TOPROL -XL) 50 MG 24 hr tablet TAKE 1 TABLET(50 MG) BY MOUTH DAILY 10/17/23  Yes Levora Reyes SAUNDERS, MD   Social History   Socioeconomic History   Marital status: Married    Spouse name: Not on file   Number of  children: 2   Years of education: Not on file   Highest education level: Professional school degree (e.g., MD, DDS, DVM, JD)  Occupational History   Occupation: pharmacist    Comment: retired 2016; now PRN    Employer: [employer]  Tobacco Use   Smoking status: Never   Smokeless tobacco: Never  Vaping Use   Vaping status: Never Used  Substance and Sexual Activity   Alcohol use: Yes    Alcohol/week: 6.0 standard drinks of alcohol    Types: 6 Glasses of wine per week    Comment: Occasional   Drug use: No   Sexual activity: Yes    Birth control/protection: Post-menopausal  Other Topics Concern   Not on file  Social History Narrative   Futures Trader   Married   Exercises   Social Drivers of Health   Financial Resource Strain: Low Risk  (03/19/2023)   Overall Financial Resource Strain (CARDIA)    Difficulty of Paying Living Expenses: Not hard at all  Food Insecurity: No Food Insecurity (03/19/2023)   Hunger Vital Sign    Worried About Running Out of Food in the Last Year: Never true    Ran Out of Food in the Last Year: Never true  Transportation Needs: No Transportation Needs (03/19/2023)   PRAPARE - Administrator, Civil Service (Medical): No    Lack of Transportation (Non-Medical): No  Physical Activity: Sufficiently Active (03/19/2023)   Exercise Vital Sign    Days of Exercise per Week: 4 days    Minutes of Exercise per Session: 50 min  Stress: No Stress Concern Present (03/19/2023)   Erin Mckee [employer] Occupational Health - Occupational Stress Questionnaire    Feeling of Stress : Not at all  Social Connections: Socially Integrated (03/19/2023)   Social Connection and Isolation Panel    Frequency of Communication with Friends and Family: More than three times a week    Frequency of Social Gatherings with Friends and Family: More than three times a week    Attends Religious Services: More than 4 times per year    Active Member of [employer] or  Organizations: Not on file    Attends Banker Meetings: More than 4 times per year    Marital Status: Married  Catering Manager Violence: Not At Risk (03/19/2023)   Humiliation, Afraid, Rape, and Kick questionnaire    Fear of Current or Ex-Partner: No    Emotionally Abused: No    Physically Abused: No    Sexually Abused: No    Review of Systems  Constitutional:  Negative for fatigue and unexpected weight change.  HENT:  Negative for trouble swallowing.   Respiratory:  Negative for cough, choking, chest tightness, shortness of breath and stridor.   Cardiovascular:  Negative for chest pain, palpitations and leg swelling.  Gastrointestinal:  Negative for abdominal pain and blood in stool.  Neurological:  Negative for  dizziness, syncope, light-headedness and headaches.    Objective:   Vitals:   02/12/24 0816  BP: 122/80  Pulse: 70  Resp: 16  Temp: 98.7 F (37.1 C)  TempSrc: Oral  SpO2: 97%  Weight: 178 lb 2 oz (80.8 kg)  Height: 5' 4 (1.626 m)     Physical Exam Vitals reviewed.  Constitutional:      Appearance: Normal appearance. She is well-developed.  HENT:     Head: Normocephalic and atraumatic.  Eyes:     Conjunctiva/sclera: Conjunctivae normal.     Pupils: Pupils are equal, round, and reactive to light.  Neck:     Vascular: No carotid bruit.  Cardiovascular:     Rate and Rhythm: Normal rate and regular rhythm.     Heart sounds: Normal heart sounds.  Pulmonary:     Effort: Pulmonary effort is normal.     Breath sounds: Normal breath sounds.  Abdominal:     Palpations: Abdomen is soft. There is no pulsatile mass.     Tenderness: There is no abdominal tenderness.  Musculoskeletal:     Right lower leg: No edema.     Left lower leg: No edema.  Skin:    General: Skin is warm and dry.  Neurological:     Mental Status: She is alert and oriented to person, place, and time.  Psychiatric:        Mood and Affect: Mood normal.        Behavior:  Behavior normal.        Assessment & Plan:  SKYLOR HUGHSON is a 78 y.o. female . Essential hypertension - Plan: hydrochlorothiazide  (HYDRODIURIL ) 25 MG tablet, metoprolol  succinate (TOPROL -XL) 50 MG 24 hr tablet  -  Stable, tolerating current regimen. Medications refilled. Labs pending as above.  Continue to monitor home blood pressure readings with RTC precautions if elevated.  Hyperlipidemia, unspecified hyperlipidemia type - Plan: atorvastatin  (LIPITOR) 10 MG tablet, Comprehensive metabolic panel with GFR, Lipid panel  - Stable, tolerating current regimen. Medications refilled. Labs pending as above.  Adjust plan accordingly based on results.  Vocal cord dysfunction - Plan: LORazepam  (ATIVAN ) 1 MG tablet Dysphonia - Plan: LORazepam  (ATIVAN ) 1 MG tablet  - Stable with intermittent use of lorazepam .  Potential risks of this medicine class have been discussed but currently she is doing well with infrequent dosing.  Will continue same.  Controlled substance database reviewed as above without concerns.  Refills granted, follow-up in 1 year with refills prior to that time if needed.  Meds ordered this encounter  Medications   hydrochlorothiazide  (HYDRODIURIL ) 25 MG tablet    Sig: TAKE 1 TABLET(25 MG) BY MOUTH DAILY    Dispense:  90 tablet    Refill:  3   atorvastatin  (LIPITOR) 10 MG tablet    Sig: TAKE 1 TABLET(10 MG) BY MOUTH DAILY    Dispense:  90 tablet    Refill:  3   metoprolol  succinate (TOPROL -XL) 50 MG 24 hr tablet    Sig: TAKE 1 TABLET(50 MG) BY MOUTH DAILY    Dispense:  90 tablet    Refill:  3   LORazepam  (ATIVAN ) 1 MG tablet    Sig: TAKE 1/2 TO 1 TABLET(0.5 TO 1 MG) BY MOUTH TWICE DAILY AS NEEDED FOR THROAT SPASMS    Dispense:  60 tablet    Refill:  0    **Patient requests 90 days supply**   Patient Instructions  Thank you for coming in today. No change in medications at this  time. If there are any concerns on your bloodwork, I will let you know. Take  care!     Signed,   Reyes Pines, MD [facility], [facility] 02/12/24 8:58 AM

## 2024-02-12 NOTE — Patient Instructions (Signed)
 Thank you for coming in today. No change in medications at this time. If there are any concerns on your bloodwork, I will let you know. Take care!

## 2024-02-15 ENCOUNTER — Ambulatory Visit: Payer: Self-pay | Admitting: Family Medicine

## 2024-04-15 ENCOUNTER — Ambulatory Visit

## 2025-02-12 ENCOUNTER — Ambulatory Visit: Admitting: Family Medicine
